# Patient Record
Sex: Female | Born: 1945
Health system: Southern US, Community
[De-identification: ages and names within clinical notes are randomized; demographics above are authoritative.]

## PROBLEM LIST (undated history)

## (undated) DIAGNOSIS — D126 Benign neoplasm of colon, unspecified: Secondary | ICD-10-CM

## (undated) DIAGNOSIS — H6122 Impacted cerumen, left ear: Secondary | ICD-10-CM

## (undated) DIAGNOSIS — Z9889 Other specified postprocedural states: Secondary | ICD-10-CM

## (undated) DIAGNOSIS — K921 Melena: Secondary | ICD-10-CM

## (undated) DIAGNOSIS — M545 Low back pain, unspecified: Secondary | ICD-10-CM

## (undated) DIAGNOSIS — L209 Atopic dermatitis, unspecified: Secondary | ICD-10-CM

## (undated) DIAGNOSIS — E039 Hypothyroidism, unspecified: Secondary | ICD-10-CM

## (undated) DIAGNOSIS — E119 Type 2 diabetes mellitus without complications: Secondary | ICD-10-CM

## (undated) DIAGNOSIS — Z86010 Personal history of colonic polyps: Secondary | ICD-10-CM

## (undated) DIAGNOSIS — R112 Nausea with vomiting, unspecified: Secondary | ICD-10-CM

## (undated) DIAGNOSIS — R519 Headache, unspecified: Secondary | ICD-10-CM

## (undated) DIAGNOSIS — K5792 Diverticulitis of intestine, part unspecified, without perforation or abscess without bleeding: Secondary | ICD-10-CM

## (undated) DIAGNOSIS — M1990 Unspecified osteoarthritis, unspecified site: Secondary | ICD-10-CM

## (undated) DIAGNOSIS — E785 Hyperlipidemia, unspecified: Secondary | ICD-10-CM

## (undated) DIAGNOSIS — M4316 Spondylolisthesis, lumbar region: Secondary | ICD-10-CM

## (undated) DIAGNOSIS — I2510 Atherosclerotic heart disease of native coronary artery without angina pectoris: Secondary | ICD-10-CM

## (undated) DIAGNOSIS — H6500 Acute serous otitis media, unspecified ear: Secondary | ICD-10-CM

## (undated) DIAGNOSIS — R55 Syncope and collapse: Secondary | ICD-10-CM

## (undated) DIAGNOSIS — H269 Unspecified cataract: Secondary | ICD-10-CM

## (undated) DIAGNOSIS — R9439 Abnormal result of other cardiovascular function study: Secondary | ICD-10-CM

## (undated) DIAGNOSIS — R195 Other fecal abnormalities: Secondary | ICD-10-CM

## (undated) DIAGNOSIS — I10 Essential (primary) hypertension: Secondary | ICD-10-CM

## (undated) HISTORY — DX: Headache, unspecified: R51.9

## (undated) HISTORY — DX: Hypothyroidism, unspecified: E03.9

## (undated) HISTORY — PX: OTHER SURGICAL HISTORY: SHX169

## (undated) HISTORY — PX: SALPINGOOPHORECTOMY: SHX82

## (undated) HISTORY — DX: Impacted cerumen, left ear: H61.22

## (undated) HISTORY — DX: Atopic dermatitis, unspecified: L20.9

## (undated) HISTORY — DX: Type 2 diabetes mellitus without complications: E11.9

## (undated) HISTORY — DX: Benign neoplasm of colon, unspecified: D12.6

## (undated) HISTORY — DX: Melena: K92.1

## (undated) HISTORY — DX: Essential (primary) hypertension: I10

## (undated) HISTORY — DX: Atherosclerotic heart disease of native coronary artery without angina pectoris: I25.10

## (undated) HISTORY — DX: Acute serous otitis media, unspecified ear: H65.00

## (undated) HISTORY — DX: Abnormal result of other cardiovascular function study: R94.39

## (undated) HISTORY — PX: DILATION AND CURETTAGE OF UTERUS: SHX78

## (undated) HISTORY — DX: Syncope and collapse: R55

## (undated) HISTORY — PX: TUBAL LIGATION: SHX77

## (undated) HISTORY — DX: Low back pain, unspecified: M54.50

## (undated) HISTORY — DX: Hyperlipidemia, unspecified: E78.5

## (undated) HISTORY — PX: COLONOSCOPY: SHX174

---
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
## 2011-11-24 ENCOUNTER — Ambulatory Visit (INDEPENDENT_AMBULATORY_CARE_PROVIDER_SITE_OTHER): Payer: Managed Care, Other (non HMO) | Admitting: Gastroenterology

## 2011-11-24 ENCOUNTER — Encounter: Payer: Self-pay | Admitting: Gastroenterology

## 2011-11-24 VITALS — BP 138/77 | HR 80 | Temp 99.1°F | Ht 63.0 in | Wt 168.8 lb

## 2011-11-24 DIAGNOSIS — K5732 Diverticulitis of large intestine without perforation or abscess without bleeding: Secondary | ICD-10-CM

## 2011-11-24 DIAGNOSIS — K5792 Diverticulitis of intestine, part unspecified, without perforation or abscess without bleeding: Secondary | ICD-10-CM | POA: Insufficient documentation

## 2011-11-24 HISTORY — DX: Diverticulitis of intestine, part unspecified, without perforation or abscess without bleeding: K57.92

## 2011-11-24 NOTE — Progress Notes (Signed)
Cc to PCP 

## 2011-11-24 NOTE — Progress Notes (Signed)
  Subjective:    Patient ID: Kathleen Terrell, female    DOB: 03/15/1946, 65 y.o.   MRN: [id number]  Pcp: [facility], ERIN JONES PAC  HPI 3 mos ago: Severe pain across mid abdomen, nausea, urge to go to BM. Finished CIP/FLAG AUG 2012 and got better. 2-3 weeks later: mild Sx, CIP BID FOR 10 DAYS.  Now feels fine. Had all teeth pulled and so couldn't eat anything. Bms: once a day. Since teeth pulled and eating less every other day.  HAD TCS 5 YEARS AGO-DR. ANWAR AT [facility]. No rectal bleeding, melena, or dysphagia. No problems with the sedation or the prep.   Past Medical History  Diagnosis Date  . HTN (hypertension)   . DM (diabetes mellitus)   . Hypothyroidism   . Hyperlipidemia     Past Surgical History  Procedure Date  . Cyst removed     under the left arm  . Salpingoophorectomy     left  . Colonoscopy [facility] ANWAR    >5 YEARS AGO    No Known Allergies  Current Outpatient Prescriptions  Medication Sig Dispense Refill  . aspirin 81 MG tablet Take 81 mg by mouth daily.        . levothyroxine (SYNTHROID, LEVOTHROID) 88 MCG tablet Take 88 mcg by mouth daily.        . lisinopril-hydrochlorothiazide (PRINZIDE,ZESTORETIC) 20-12.5 MG per tablet Take 1 tablet by mouth daily.        . pravastatin (PRAVACHOL) 40 MG tablet Take 40 mg by mouth daily.          Family History  Problem Relation Age of Onset  . Colon cancer Neg Hx   . 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  History  Substance Use Topics  . Smoking status: Former Smoker -- 1.5 packs/day    Types: Cigarettes  . Smokeless tobacco: Former User    Quit date: 03/23/1984  . Alcohol Use: No   Review of Systems  All other systems reviewed and are negative.      Objective:   Physical Exam  Vitals reviewed. Constitutional: She is oriented to person, place, and time. She appears well-developed and well-nourished. No  distress.  HENT:  Head: Normocephalic and atraumatic.  Mouth/Throat: Oropharynx is clear and moist. No oropharyngeal exudate.  Eyes: Pupils are equal, round, and reactive to light. No scleral icterus.  Neck: Normal range of motion. Neck supple.  Cardiovascular: Normal rate, regular rhythm and normal heart sounds.   Pulmonary/Chest: Effort normal and breath sounds normal.  Abdominal: Bowel sounds are normal. She exhibits no distension. There is no tenderness.  Musculoskeletal: Normal range of motion. She exhibits no edema.  Lymphadenopathy:    She has no cervical adenopathy.  Neurological: She is alert and oriented to person, place, and time.  Psychiatric: She has a normal mood and affect.          Assessment & Plan:   

## 2011-11-24 NOTE — Assessment & Plan Note (Signed)
LAST TCS > 5 YEARS.  TCS 12/17 OPV PRN

## 2011-11-28 NOTE — Progress Notes (Signed)
Reminder in epic to have tcs in 2017

## 2011-12-05 ENCOUNTER — Encounter (HOSPITAL_COMMUNITY): Payer: Self-pay | Admitting: Pharmacy Technician

## 2011-12-09 MED ORDER — SODIUM CHLORIDE 0.45 % IV SOLN
Freq: Once | INTRAVENOUS | Status: AC
  Administered 2011-12-12: 08:00:00 via INTRAVENOUS

## 2011-12-12 ENCOUNTER — Encounter (HOSPITAL_COMMUNITY): Payer: Self-pay

## 2011-12-12 ENCOUNTER — Other Ambulatory Visit: Payer: Self-pay | Admitting: Gastroenterology

## 2011-12-12 ENCOUNTER — Ambulatory Visit (HOSPITAL_COMMUNITY)
Admission: RE | Admit: 2011-12-12 | Discharge: 2011-12-12 | Disposition: A | Discharge status: Home / Self Care | Payer: Managed Care, Other (non HMO) | Source: Ambulatory Visit | Attending: Gastroenterology | Admitting: Gastroenterology

## 2011-12-12 ENCOUNTER — Encounter (HOSPITAL_COMMUNITY)
Admission: RE | Disposition: A | Discharge status: Home / Self Care | Payer: Self-pay | Source: Ambulatory Visit | Attending: Gastroenterology

## 2011-12-12 DIAGNOSIS — K648 Other hemorrhoids: Secondary | ICD-10-CM

## 2011-12-12 DIAGNOSIS — Z79899 Other long term (current) drug therapy: Secondary | ICD-10-CM | POA: Insufficient documentation

## 2011-12-12 DIAGNOSIS — E785 Hyperlipidemia, unspecified: Secondary | ICD-10-CM | POA: Insufficient documentation

## 2011-12-12 DIAGNOSIS — K5792 Diverticulitis of intestine, part unspecified, without perforation or abscess without bleeding: Secondary | ICD-10-CM

## 2011-12-12 DIAGNOSIS — D126 Benign neoplasm of colon, unspecified: Secondary | ICD-10-CM | POA: Insufficient documentation

## 2011-12-12 DIAGNOSIS — Z01812 Encounter for preprocedural laboratory examination: Secondary | ICD-10-CM | POA: Insufficient documentation

## 2011-12-12 DIAGNOSIS — K5730 Diverticulosis of large intestine without perforation or abscess without bleeding: Secondary | ICD-10-CM | POA: Insufficient documentation

## 2011-12-12 DIAGNOSIS — E119 Type 2 diabetes mellitus without complications: Secondary | ICD-10-CM | POA: Insufficient documentation

## 2011-12-12 DIAGNOSIS — Z7982 Long term (current) use of aspirin: Secondary | ICD-10-CM | POA: Insufficient documentation

## 2011-12-12 DIAGNOSIS — I10 Essential (primary) hypertension: Secondary | ICD-10-CM | POA: Insufficient documentation

## 2011-12-12 HISTORY — PX: COLONOSCOPY: SHX5424

## 2011-12-12 SURGERY — COLONOSCOPY
Anesthesia: Moderate Sedation

## 2011-12-12 MED ORDER — STERILE WATER FOR IRRIGATION IR SOLN
Status: DC | PRN
  Administered 2011-12-12: 09:00:00

## 2011-12-12 MED ORDER — MEPERIDINE HCL 100 MG/ML IJ SOLN
INTRAMUSCULAR | Status: DC | PRN
  Administered 2011-12-12 (×2): 25 mg via INTRAVENOUS

## 2011-12-12 MED ORDER — MIDAZOLAM HCL 5 MG/5ML IJ SOLN
INTRAMUSCULAR | Status: DC | PRN
  Administered 2011-12-12 (×2): 2 mg via INTRAVENOUS

## 2011-12-12 MED ORDER — MEPERIDINE HCL 100 MG/ML IJ SOLN
INTRAMUSCULAR | Status: AC
  Filled 2011-12-12: qty 1

## 2011-12-12 MED ORDER — MIDAZOLAM HCL 5 MG/5ML IJ SOLN
INTRAMUSCULAR | Status: AC
  Filled 2011-12-12: qty 5

## 2011-12-12 NOTE — H&P (View-Only) (Signed)
  Subjective:    Patient ID: Kathleen Terrell, female    DOB: [birth year], 65 y.o.   MRN: [id number]  Pcp: [facility], ERIN JONES PAC  HPI 3 mos ago: Severe pain across mid abdomen, nausea, urge to go to BM. Finished CIP/FLAG AUG 2012 and got better. 2-3 weeks later: mild Sx, CIP BID FOR 10 DAYS.  Now feels fine. Had all teeth pulled and so couldn't eat anything. Bms: once a day. Since teeth pulled and eating less every other day.  HAD TCS 5 YEARS AGO-DR. Linna Darner AT [facility] No rectal bleeding, melena, or dysphagia. No problems with the sedation or the prep.   Past Medical History  Diagnosis Date  . HTN (hypertension)   . DM (diabetes mellitus)   . Hypothyroidism   . Hyperlipidemia     Past Surgical History  Procedure Date  . Cyst removed     under the left arm  . Salpingoophorectomy     left  . Colonoscopy [facility]    >5 YEARS AGO    No Known Allergies  Current Outpatient Prescriptions  Medication Sig Dispense Refill  . aspirin 81 MG tablet Take 81 mg by mouth daily.        Marland Kitchen levothyroxine (SYNTHROID, LEVOTHROID) 88 MCG tablet Take 88 mcg by mouth daily.        Marland Kitchen lisinopril-hydrochlorothiazide (PRINZIDE,ZESTORETIC) 20-12.5 MG per tablet Take 1 tablet by mouth daily.        . pravastatin (PRAVACHOL) 40 MG tablet Take 40 mg by mouth daily.          Family History  Problem Relation Age of Onset  . Colon cancer Neg Hx   . 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  History  Substance Use Topics  . Smoking status: Former Smoker -- 1.5 packs/day    Types: Cigarettes  . Smokeless tobacco: Former Neurosurgeon    Quit date: 03/23/1984  . Alcohol Use: No   Review of Systems  All other systems reviewed and are negative.      Objective:   Physical Exam  Vitals reviewed. Constitutional: She is oriented to person, place, and time. She appears well-developed and well-nourished. No  distress.  HENT:  Head: Normocephalic and atraumatic.  Mouth/Throat: Oropharynx is clear and moist. No oropharyngeal exudate.  Eyes: Pupils are equal, round, and reactive to light. No scleral icterus.  Neck: Normal range of motion. Neck supple.  Cardiovascular: Normal rate, regular rhythm and normal heart sounds.   Pulmonary/Chest: Effort normal and breath sounds normal.  Abdominal: Bowel sounds are normal. She exhibits no distension. There is no tenderness.  Musculoskeletal: Normal range of motion. She exhibits no edema.  Lymphadenopathy:    She has no cervical adenopathy.  Neurological: She is alert and oriented to person, place, and time.  Psychiatric: She has a normal mood and affect.          Assessment & Plan:

## 2011-12-12 NOTE — Op Note (Signed)
[facility] [address] [place], [place]  [address]  COLONOSCOPY PROCEDURE REPORT  PATIENT:  Kathleen, Terrell  MR#:  [id number] BIRTHDATE:  [birth year], 65 yrs. old  GENDER:  female  ENDOSCOPIST:  Jonette Eva, MD REF. BY:  Jobe Marker, [facility]ICINE ASSISTANT:  PROCEDURE DATE:  12/12/2011 PROCEDURE:  Colonoscopy with biopsy  INDICATIONS:  [id number], LAST TCS > 5 YEARS  MEDICATIONS:   Demerol 50 mg IV, Versed 4 mg IV  DESCRIPTION OF PROCEDURE:    Physical exam was performed. Informed consent was obtained from the patient after explaining the benefits, risks, and alternatives to procedure.  The patient was connected to monitor and placed in left lateral position. Continuous oxygen was provided by nasal cannula and IV medicine administered through an indwelling cannula.  After administration of sedation and rectal exam, the patient's rectum was intubated and the EC-[id number] ([id number]) colonoscope was advanced under direct visualization to the cecum.  The scope was removed slowly by carefully examining the color, texture, anatomy, and integrity mucosa on the way out.  The patient was recovered in endoscopy and discharged home in satisfactory condition. <<PROCEDUREIMAGES>>  FINDINGS:  Diverticula were found FROM THE TRANSVERSE TO THE SIGMOID COLON.  There were TWO polyps identified and removed FROM THE DESCENDING (3 MM) VIA COLD FORCEPS & SIGMOID COLON(6 MM) VIA SNARE CAUTERY. SMALL INTERNAL HEMORRHOIDS.  PREP QUALITY: EXCELLENT CECAL W/D TIME:     15 minutes  COMPLICATIONS:    None  ENDOSCOPIC IMPRESSION: 1) Diverticulosis, MODERATE 2) Polyps, multiple in the DESCENDING & SIGMOID COLON 3) Internal hemorrhoids  RECOMMENDATIONS: TCS IN 5-10 YEARS HIGH IFBER DIET AWAIT BIOPSIES  REPEAT EXAM:  No  ______________________________ Jonette Eva, MD  CC:  n. eSIGNED:   Andoni Busch at 12/12/2011 10:09 AM  Benedict Needy, [phone]

## 2011-12-12 NOTE — Interval H&P Note (Signed)
History and Physical Interval Note:  12/12/2011 8:41 AM  Kathleen Terrell  has presented today for surgery, with the diagnosis of diverticulitis  The various methods of treatment have been discussed with the patient and family. After consideration of risks, benefits and other options for treatment, the patient has consented to  Procedure(s): COLONOSCOPY as a surgical intervention .  The patients' history has been reviewed, patient examined, no change in status, stable for surgery.  I have reviewed the patients' chart and labs.  Questions were answered to the patient's satisfaction.     [employer]

## 2011-12-15 ENCOUNTER — Telehealth: Payer: Self-pay | Admitting: Gastroenterology

## 2011-12-15 NOTE — Telephone Encounter (Signed)
Please call pt. She had A simple adenoma removed from her colon. TCS in 10 years. High fiber diet.  

## 2011-12-15 NOTE — Telephone Encounter (Signed)
Cc to PCP 

## 2011-12-16 NOTE — Telephone Encounter (Signed)
Pt informed

## 2011-12-26 ENCOUNTER — Encounter (HOSPITAL_COMMUNITY): Payer: Self-pay | Admitting: Gastroenterology

## 2011-12-28 ENCOUNTER — Telehealth: Payer: Self-pay | Admitting: Gastroenterology

## 2011-12-28 NOTE — Telephone Encounter (Signed)
Reminder in epic to have tcs in 10 years 

## 2015-05-11 DIAGNOSIS — I10 Essential (primary) hypertension: Secondary | ICD-10-CM | POA: Diagnosis not present

## 2015-05-11 DIAGNOSIS — E785 Hyperlipidemia, unspecified: Secondary | ICD-10-CM | POA: Diagnosis not present

## 2015-05-11 DIAGNOSIS — E119 Type 2 diabetes mellitus without complications: Secondary | ICD-10-CM | POA: Diagnosis not present

## 2015-05-11 DIAGNOSIS — E782 Mixed hyperlipidemia: Secondary | ICD-10-CM | POA: Diagnosis not present

## 2015-05-11 DIAGNOSIS — E039 Hypothyroidism, unspecified: Secondary | ICD-10-CM | POA: Diagnosis not present

## 2015-05-18 DIAGNOSIS — E119 Type 2 diabetes mellitus without complications: Secondary | ICD-10-CM | POA: Diagnosis not present

## 2015-05-18 DIAGNOSIS — L2089 Other atopic dermatitis: Secondary | ICD-10-CM | POA: Diagnosis not present

## 2015-05-18 DIAGNOSIS — I10 Essential (primary) hypertension: Secondary | ICD-10-CM | POA: Diagnosis not present

## 2015-05-18 DIAGNOSIS — E782 Mixed hyperlipidemia: Secondary | ICD-10-CM | POA: Diagnosis not present

## 2015-05-18 DIAGNOSIS — M545 Low back pain: Secondary | ICD-10-CM | POA: Diagnosis not present

## 2015-05-18 DIAGNOSIS — E039 Hypothyroidism, unspecified: Secondary | ICD-10-CM | POA: Diagnosis not present

## 2015-06-22 DIAGNOSIS — M5136 Other intervertebral disc degeneration, lumbar region: Secondary | ICD-10-CM | POA: Diagnosis not present

## 2015-06-22 DIAGNOSIS — M4806 Spinal stenosis, lumbar region: Secondary | ICD-10-CM | POA: Diagnosis not present

## 2015-06-22 DIAGNOSIS — M47816 Spondylosis without myelopathy or radiculopathy, lumbar region: Secondary | ICD-10-CM | POA: Diagnosis not present

## 2015-07-06 DIAGNOSIS — M549 Dorsalgia, unspecified: Secondary | ICD-10-CM | POA: Diagnosis not present

## 2015-07-06 DIAGNOSIS — Z6831 Body mass index (BMI) 31.0-31.9, adult: Secondary | ICD-10-CM | POA: Diagnosis not present

## 2015-07-06 DIAGNOSIS — M4316 Spondylolisthesis, lumbar region: Secondary | ICD-10-CM | POA: Diagnosis not present

## 2015-09-22 DIAGNOSIS — M5416 Radiculopathy, lumbar region: Secondary | ICD-10-CM | POA: Diagnosis not present

## 2015-09-22 DIAGNOSIS — R030 Elevated blood-pressure reading, without diagnosis of hypertension: Secondary | ICD-10-CM | POA: Diagnosis not present

## 2015-09-22 DIAGNOSIS — M4806 Spinal stenosis, lumbar region: Secondary | ICD-10-CM | POA: Diagnosis not present

## 2015-09-22 DIAGNOSIS — I10 Essential (primary) hypertension: Secondary | ICD-10-CM | POA: Diagnosis not present

## 2015-09-22 DIAGNOSIS — Z6831 Body mass index (BMI) 31.0-31.9, adult: Secondary | ICD-10-CM | POA: Diagnosis not present

## 2015-11-09 DIAGNOSIS — E119 Type 2 diabetes mellitus without complications: Secondary | ICD-10-CM | POA: Diagnosis not present

## 2015-11-09 DIAGNOSIS — E039 Hypothyroidism, unspecified: Secondary | ICD-10-CM | POA: Diagnosis not present

## 2015-11-09 DIAGNOSIS — E782 Mixed hyperlipidemia: Secondary | ICD-10-CM | POA: Diagnosis not present

## 2015-11-09 DIAGNOSIS — I10 Essential (primary) hypertension: Secondary | ICD-10-CM | POA: Diagnosis not present

## 2015-11-16 DIAGNOSIS — E782 Mixed hyperlipidemia: Secondary | ICD-10-CM | POA: Diagnosis not present

## 2015-11-16 DIAGNOSIS — I10 Essential (primary) hypertension: Secondary | ICD-10-CM | POA: Diagnosis not present

## 2015-11-16 DIAGNOSIS — E039 Hypothyroidism, unspecified: Secondary | ICD-10-CM | POA: Diagnosis not present

## 2015-11-16 DIAGNOSIS — M545 Low back pain: Secondary | ICD-10-CM | POA: Diagnosis not present

## 2015-11-16 DIAGNOSIS — E119 Type 2 diabetes mellitus without complications: Secondary | ICD-10-CM | POA: Diagnosis not present

## 2015-11-27 DIAGNOSIS — Z23 Encounter for immunization: Secondary | ICD-10-CM | POA: Diagnosis not present

## 2015-12-01 DIAGNOSIS — Z1231 Encounter for screening mammogram for malignant neoplasm of breast: Secondary | ICD-10-CM | POA: Diagnosis not present

## 2015-12-09 DIAGNOSIS — M4806 Spinal stenosis, lumbar region: Secondary | ICD-10-CM | POA: Diagnosis not present

## 2015-12-09 DIAGNOSIS — I10 Essential (primary) hypertension: Secondary | ICD-10-CM | POA: Diagnosis not present

## 2015-12-09 DIAGNOSIS — M5416 Radiculopathy, lumbar region: Secondary | ICD-10-CM | POA: Diagnosis not present

## 2015-12-09 DIAGNOSIS — Z6831 Body mass index (BMI) 31.0-31.9, adult: Secondary | ICD-10-CM | POA: Diagnosis not present

## 2016-03-22 DIAGNOSIS — I10 Essential (primary) hypertension: Secondary | ICD-10-CM | POA: Diagnosis not present

## 2016-03-22 DIAGNOSIS — M4806 Spinal stenosis, lumbar region: Secondary | ICD-10-CM | POA: Diagnosis not present

## 2016-03-22 DIAGNOSIS — M5416 Radiculopathy, lumbar region: Secondary | ICD-10-CM | POA: Diagnosis not present

## 2016-04-04 DIAGNOSIS — I10 Essential (primary) hypertension: Secondary | ICD-10-CM | POA: Diagnosis not present

## 2016-04-04 DIAGNOSIS — M4316 Spondylolisthesis, lumbar region: Secondary | ICD-10-CM | POA: Diagnosis not present

## 2016-06-01 DIAGNOSIS — I10 Essential (primary) hypertension: Secondary | ICD-10-CM | POA: Diagnosis not present

## 2016-06-01 DIAGNOSIS — M5416 Radiculopathy, lumbar region: Secondary | ICD-10-CM | POA: Diagnosis not present

## 2016-06-01 DIAGNOSIS — M4806 Spinal stenosis, lumbar region: Secondary | ICD-10-CM | POA: Diagnosis not present

## 2016-06-30 DIAGNOSIS — E039 Hypothyroidism, unspecified: Secondary | ICD-10-CM | POA: Diagnosis not present

## 2016-06-30 DIAGNOSIS — E782 Mixed hyperlipidemia: Secondary | ICD-10-CM | POA: Diagnosis not present

## 2016-06-30 DIAGNOSIS — E785 Hyperlipidemia, unspecified: Secondary | ICD-10-CM | POA: Diagnosis not present

## 2016-06-30 DIAGNOSIS — I10 Essential (primary) hypertension: Secondary | ICD-10-CM | POA: Diagnosis not present

## 2016-06-30 DIAGNOSIS — E119 Type 2 diabetes mellitus without complications: Secondary | ICD-10-CM | POA: Diagnosis not present

## 2016-07-08 DIAGNOSIS — Z0000 Encounter for general adult medical examination without abnormal findings: Secondary | ICD-10-CM | POA: Diagnosis not present

## 2016-07-08 DIAGNOSIS — M545 Low back pain: Secondary | ICD-10-CM | POA: Diagnosis not present

## 2016-07-26 DIAGNOSIS — I10 Essential (primary) hypertension: Secondary | ICD-10-CM | POA: Diagnosis not present

## 2016-07-26 DIAGNOSIS — M5416 Radiculopathy, lumbar region: Secondary | ICD-10-CM | POA: Diagnosis not present

## 2016-07-29 ENCOUNTER — Other Ambulatory Visit: Payer: Self-pay | Admitting: Neurosurgery

## 2016-08-03 DIAGNOSIS — H2513 Age-related nuclear cataract, bilateral: Secondary | ICD-10-CM | POA: Diagnosis not present

## 2016-08-03 DIAGNOSIS — H538 Other visual disturbances: Secondary | ICD-10-CM | POA: Diagnosis not present

## 2016-08-15 DIAGNOSIS — H538 Other visual disturbances: Secondary | ICD-10-CM | POA: Diagnosis not present

## 2016-08-25 DIAGNOSIS — I10 Essential (primary) hypertension: Secondary | ICD-10-CM | POA: Diagnosis not present

## 2016-08-25 DIAGNOSIS — M5416 Radiculopathy, lumbar region: Secondary | ICD-10-CM | POA: Diagnosis not present

## 2016-08-25 DIAGNOSIS — M4316 Spondylolisthesis, lumbar region: Secondary | ICD-10-CM | POA: Diagnosis not present

## 2016-08-25 DIAGNOSIS — M4806 Spinal stenosis, lumbar region: Secondary | ICD-10-CM | POA: Diagnosis not present

## 2016-08-30 ENCOUNTER — Other Ambulatory Visit: Payer: Self-pay

## 2016-08-30 ENCOUNTER — Encounter (HOSPITAL_COMMUNITY): Payer: Self-pay | Admitting: *Deleted

## 2016-08-30 ENCOUNTER — Encounter (HOSPITAL_COMMUNITY)
Admission: RE | Admit: 2016-08-30 | Discharge: 2016-08-30 | Disposition: A | Discharge status: Home / Self Care | Payer: Medicare Other | Source: Ambulatory Visit | Attending: Neurosurgery | Admitting: Neurosurgery

## 2016-08-30 DIAGNOSIS — Z01818 Encounter for other preprocedural examination: Secondary | ICD-10-CM | POA: Diagnosis not present

## 2016-08-30 DIAGNOSIS — M5416 Radiculopathy, lumbar region: Secondary | ICD-10-CM | POA: Insufficient documentation

## 2016-08-30 DIAGNOSIS — Z01812 Encounter for preprocedural laboratory examination: Secondary | ICD-10-CM | POA: Insufficient documentation

## 2016-08-30 DIAGNOSIS — I10 Essential (primary) hypertension: Secondary | ICD-10-CM | POA: Insufficient documentation

## 2016-08-30 DIAGNOSIS — Z0183 Encounter for blood typing: Secondary | ICD-10-CM | POA: Diagnosis not present

## 2016-08-30 HISTORY — DX: Unspecified osteoarthritis, unspecified site: M19.90

## 2016-08-30 HISTORY — DX: Nausea with vomiting, unspecified: Z98.890

## 2016-08-30 HISTORY — DX: Unspecified cataract: H26.9

## 2016-08-30 HISTORY — DX: Other specified postprocedural states: R11.2

## 2016-08-30 LAB — SURGICAL PCR SCREEN
MRSA, PCR: NEGATIVE
Staphylococcus aureus: NEGATIVE

## 2016-08-30 LAB — CBC
HCT: 39.9 % (ref 36.0–46.0)
Hemoglobin: 12.6 g/dL (ref 12.0–15.0)
MCH: 27.4 pg (ref 26.0–34.0)
MCHC: 31.6 g/dL (ref 30.0–36.0)
MCV: 86.7 fL (ref 78.0–100.0)
PLATELETS: 197 10*3/uL (ref 150–400)
RBC: 4.6 MIL/uL (ref 3.87–5.11)
RDW: 14.6 % (ref 11.5–15.5)
WBC: 6.8 10*3/uL (ref 4.0–10.5)

## 2016-08-30 LAB — BASIC METABOLIC PANEL
Anion gap: 8 (ref 5–15)
BUN: 7 mg/dL (ref 6–20)
CALCIUM: 10 mg/dL (ref 8.9–10.3)
CHLORIDE: 104 mmol/L (ref 101–111)
CO2: 27 mmol/L (ref 22–32)
CREATININE: 0.75 mg/dL (ref 0.44–1.00)
GFR calc Af Amer: >60 mL/min (ref >60)
GFR calc non Af Amer: >60 mL/min (ref >60)
GLUCOSE: 81 mg/dL (ref 65–99)
Potassium: 3.7 mmol/L (ref 3.5–5.1)
Sodium: 139 mmol/L (ref 135–145)

## 2016-08-30 LAB — TYPE AND SCREEN
ABO/RH(D): O POS
ANTIBODY SCREEN: NEGATIVE

## 2016-08-30 LAB — ABO/RH: ABO/RH(D): O POS

## 2016-08-30 NOTE — Pre-Procedure Instructions (Signed)
Kathleen Terrell  08/30/2016      [place], [place] [facility] [place] [address] [place] [address] Phone: [phone] Fax: [phone]    Your procedure is scheduled on September 12  Report to [place] at 0700 A.M.  Call this number if you have problems the morning of surgery:  [phone]   Remember:  Do not eat food or drink liquids after midnight.   Take these medicines the morning of surgery with A SIP OF WATER Acetaminophen (tylenol), levothyroxine (synthroid),   7 days prior to surgery STOP taking any Aspirin, Aleve, Naproxen, Ibuprofen, Motrin, Advil, Goody's, BC's, all herbal medications, fish oil, and all vitamins      Do not wear jewelry, make-up or nail polish.  Do not wear lotions, powders, or perfumes, or deoderant.  Do not shave 48 hours prior to surgery.    Do not bring valuables to the hospital.  [facility] is not responsible for any belongings or valuables.  Contacts, dentures or bridgework may not be worn into surgery.  Leave your suitcase in the car.  After surgery it may be brought to your room.  For patients admitted to the hospital, discharge time will be determined by your treatment team.  Patients discharged the day of surgery will not be allowed to drive home.    Special instructions:   [place]- Preparing For Surgery  Before surgery, you can play an important role. Because skin is not sterile, your skin needs to be as free of germs as possible. You can reduce the number of germs on your skin by washing with CHG (chlorahexidine gluconate) Soap before surgery.  CHG is an antiseptic cleaner which kills germs and bonds with the skin to continue killing germs even after washing.  Please do not use if you have an allergy to CHG or antibacterial soaps. If your skin becomes reddened/irritated stop using the CHG.  Do not shave (including legs and underarms) for at least 48  hours prior to first CHG shower. It is OK to shave your face.  Please follow these instructions carefully.   1. Shower the NIGHT BEFORE SURGERY and the MORNING OF SURGERY with CHG.   2. If you chose to wash your hair, wash your hair first as usual with your normal shampoo.  3. After you shampoo, rinse your hair and body thoroughly to remove the shampoo.  4. Use CHG as you would any other liquid soap. You can apply CHG directly to the skin and wash gently with a scrungie or a clean washcloth.   5. Apply the CHG Soap to your body ONLY FROM THE NECK DOWN.  Do not use on open wounds or open sores. Avoid contact with your eyes, ears, mouth and genitals (private parts). Wash genitals (private parts) with your normal soap.  6. Wash thoroughly, paying special attention to the area where your surgery will be performed.  7. Thoroughly rinse your body with warm water from the neck down.  8. DO NOT shower/wash with your normal soap after using and rinsing off the CHG Soap.  9. Pat yourself dry with a CLEAN TOWEL.   10. Wear CLEAN PAJAMAS   11. Place CLEAN SHEETS on your bed the night of your first shower and DO NOT SLEEP WITH PETS.    Day of Surgery: Do not apply any deodorants/lotions. Please wear clean clothes to the hospital/surgery center.  Please read over the following fact sheets that you were given. Pain Booklet, Coughing and Deep Breathing, MRSA Information and Surgical Site Infection Prevention

## 2016-08-30 NOTE — Progress Notes (Signed)
PCP - Rory Percy Cardiologist - denies  Chest x-ray - not needed EKG - 08/30/16 Stress Test denies-  ECHO - denies Cardiac Cath - denies    Patient denies shortness of breath, fever, cough and chest pain at PAT appointment

## 2016-09-05 HISTORY — PX: BACK SURGERY: SHX140

## 2016-09-05 MED ORDER — CEFAZOLIN SODIUM-DEXTROSE 2-4 GM/100ML-% IV SOLN
2.0000 g | INTRAVENOUS | Status: AC
  Administered 2016-09-06: 2 g via INTRAVENOUS
  Filled 2016-09-05: qty 100

## 2016-09-05 NOTE — Anesthesia Preprocedure Evaluation (Addendum)
Anesthesia Evaluation  Patient identified by MRN, date of birth, ID band Patient awake    Reviewed: Allergy & Precautions, NPO status , Patient's Chart, lab work & pertinent test results  History of Anesthesia Complications (+) PONV and history of anesthetic complications  Airway Mallampati: II  TM Distance: >3 FB Neck ROM: Full    Dental  (+) Edentulous Upper, Edentulous Lower   Pulmonary neg pulmonary ROS, former smoker,    Pulmonary exam normal breath sounds clear to auscultation       Cardiovascular hypertension, negative cardio ROS Normal cardiovascular exam Rhythm:Regular Rate:Normal     Neuro/Psych negative neurological ROS  negative psych ROS   GI/Hepatic negative GI ROS, Neg liver ROS,   Endo/Other  diabetesHypothyroidism   Renal/GU negative Renal ROS     Musculoskeletal  (+) Arthritis ,   Abdominal (+) + obese, scaphoid   Peds  Hematology negative hematology ROS (+)   Anesthesia Other Findings   Reproductive/Obstetrics negative OB ROS                            Anesthesia Physical Anesthesia Plan  ASA: II  Anesthesia Plan: General   Post-op Pain Management:    Induction: Intravenous  Airway Management Planned: Oral ETT  Additional Equipment:   Intra-op Plan:   Post-operative Plan: Extubation in OR  Informed Consent: I have reviewed the patients History and Physical, chart, labs and discussed the procedure including the risks, benefits and alternatives for the proposed anesthesia with the patient or authorized representative who has indicated his/her understanding and acceptance.   Dental advisory given  Plan Discussed with: CRNA  Anesthesia Plan Comments:         Anesthesia Quick Evaluation

## 2016-09-05 NOTE — H&P (Signed)
Kathleen Terrell is an 70 y.o. female.   Chief Complaint: chronic back pain HPI: patient who saw me initially last year with complains of lumbar pain with radiation to both legs associated with weakness. Mri twice showed l4-5 spondylolisthesis with stenosis  Past Medical History:  Diagnosis Date  . Arthritis   . Cataracts, bilateral   . DM (diabetes mellitus) ([place])    patient denies, not on medications, diet controlled  . HTN (hypertension)   . Hyperlipidemia   . Hypothyroidism   . PONV (postoperative nausea and vomiting)     Past Surgical History:  Procedure Laterality Date  . COLONOSCOPY  [place] [facility]   >5 YEARS AGO  . COLONOSCOPY  12/12/2011   Procedure: COLONOSCOPY;  Surgeon: Dorothyann Peng, MD;  Location: [facility];  Service: Endoscopy;  Laterality: N/A;  9:00  . cyst removed     under the left arm  . DILATION AND CURETTAGE OF UTERUS    . SALPINGOOPHORECTOMY     left  . TUBAL LIGATION    . tubual pregnancy      Family History  Problem Relation Age of Onset  . Colon cancer Neg Hx   . Colon polyps Neg Hx    Social History:  reports that she quit smoking about 32 years ago. Her smoking use included Cigarettes. She smoked 1.50 packs per day. She has never used smokeless tobacco. She reports that she does not drink alcohol or use drugs.  Allergies:  Allergies  Allergen Reactions  . Darvocet [Propoxyphene N-Acetaminophen] Other (See Comments)    UNSPECIFIED REACTION   . Latex Rash    No prescriptions prior to admission.    No results found for this or any previous visit (from the past 48 hour(s)). No results found.  Review of Systems  Constitutional: Negative.   HENT: Negative.   Eyes: Negative.   Respiratory: Negative.   Gastrointestinal: Negative.   Genitourinary: Negative.   Musculoskeletal: Positive for back pain.  Skin: Negative.   Neurological: Positive for sensory change and focal weakness.  Endo/Heme/Allergies: Negative.   Psychiatric/Behavioral:  Negative.     There were no vitals taken for this visit. Physical Exam hent, nl. Neck, nl. Cv, nl. Lungs, clear. Abdomen, nl. Extremities nl. NEURO weakness of dorsiflexion of both feet. SLR positive at 60 degrees. sensory and dtr are normal  Mri shows grade 1 spondylolisthesis and stenosis at l4-5 Assessment/Plan Patient wants to go ahead with surgery. She and daughter are aware of risks and benefits  Floyce Stakes, MD 09/05/2016, 5:27 PM

## 2016-09-06 ENCOUNTER — Inpatient Hospital Stay (HOSPITAL_COMMUNITY)
Admission: RE | Admit: 2016-09-06 | Discharge: 2016-09-10 | DRG: 460 | Disposition: A | Discharge status: Home Health | Payer: Medicare Other | Source: Ambulatory Visit | Attending: Neurosurgery | Admitting: Neurosurgery

## 2016-09-06 ENCOUNTER — Inpatient Hospital Stay (HOSPITAL_COMMUNITY): Payer: Medicare Other

## 2016-09-06 ENCOUNTER — Inpatient Hospital Stay (HOSPITAL_COMMUNITY): Payer: Medicare Other | Admitting: Certified Registered Nurse Anesthetist

## 2016-09-06 ENCOUNTER — Inpatient Hospital Stay (HOSPITAL_COMMUNITY)
Admission: RE | Disposition: A | Discharge status: Home Health | Payer: Self-pay | Source: Ambulatory Visit | Attending: Neurosurgery

## 2016-09-06 ENCOUNTER — Encounter (HOSPITAL_COMMUNITY): Payer: Self-pay

## 2016-09-06 DIAGNOSIS — Z886 Allergy status to analgesic agent status: Secondary | ICD-10-CM | POA: Diagnosis not present

## 2016-09-06 DIAGNOSIS — Z419 Encounter for procedure for purposes other than remedying health state, unspecified: Secondary | ICD-10-CM

## 2016-09-06 DIAGNOSIS — M4316 Spondylolisthesis, lumbar region: Secondary | ICD-10-CM | POA: Diagnosis not present

## 2016-09-06 DIAGNOSIS — I10 Essential (primary) hypertension: Secondary | ICD-10-CM | POA: Diagnosis not present

## 2016-09-06 DIAGNOSIS — E669 Obesity, unspecified: Secondary | ICD-10-CM | POA: Diagnosis present

## 2016-09-06 DIAGNOSIS — M4806 Spinal stenosis, lumbar region: Secondary | ICD-10-CM | POA: Diagnosis present

## 2016-09-06 DIAGNOSIS — E785 Hyperlipidemia, unspecified: Secondary | ICD-10-CM | POA: Diagnosis not present

## 2016-09-06 DIAGNOSIS — E119 Type 2 diabetes mellitus without complications: Secondary | ICD-10-CM | POA: Diagnosis not present

## 2016-09-06 DIAGNOSIS — Z6838 Body mass index (BMI) 38.0-38.9, adult: Secondary | ICD-10-CM | POA: Diagnosis not present

## 2016-09-06 DIAGNOSIS — M5489 Other dorsalgia: Secondary | ICD-10-CM | POA: Diagnosis present

## 2016-09-06 DIAGNOSIS — K5900 Constipation, unspecified: Secondary | ICD-10-CM | POA: Diagnosis not present

## 2016-09-06 DIAGNOSIS — E039 Hypothyroidism, unspecified: Secondary | ICD-10-CM | POA: Diagnosis not present

## 2016-09-06 DIAGNOSIS — M5416 Radiculopathy, lumbar region: Secondary | ICD-10-CM | POA: Diagnosis present

## 2016-09-06 DIAGNOSIS — M4326 Fusion of spine, lumbar region: Secondary | ICD-10-CM | POA: Diagnosis not present

## 2016-09-06 DIAGNOSIS — Z91040 Latex allergy status: Secondary | ICD-10-CM | POA: Diagnosis not present

## 2016-09-06 DIAGNOSIS — Z87891 Personal history of nicotine dependence: Secondary | ICD-10-CM | POA: Diagnosis not present

## 2016-09-06 DIAGNOSIS — R269 Unspecified abnormalities of gait and mobility: Secondary | ICD-10-CM | POA: Diagnosis not present

## 2016-09-06 DIAGNOSIS — Z981 Arthrodesis status: Secondary | ICD-10-CM | POA: Diagnosis not present

## 2016-09-06 HISTORY — DX: Spondylolisthesis, lumbar region: M43.16

## 2016-09-06 LAB — GLUCOSE, CAPILLARY
Glucose-Capillary: 102 mg/dL — ABNORMAL HIGH (ref 65–99)
Glucose-Capillary: 108 mg/dL — ABNORMAL HIGH (ref 65–99)

## 2016-09-06 SURGERY — POSTERIOR LUMBAR FUSION 1 LEVEL
Anesthesia: General

## 2016-09-06 MED ORDER — DIPHENHYDRAMINE HCL 50 MG/ML IJ SOLN: 12.5000 mg | Freq: Four times a day (QID) | INTRAMUSCULAR | Status: DC | PRN

## 2016-09-06 MED ORDER — FENTANYL CITRATE (PF) 100 MCG/2ML IJ SOLN
INTRAMUSCULAR | Status: AC
  Filled 2016-09-06: qty 2

## 2016-09-06 MED ORDER — LEVOTHYROXINE SODIUM 88 MCG PO TABS
88.0000 ug | ORAL_TABLET | Freq: Every day | ORAL | Status: DC
  Administered 2016-09-07 – 2016-09-10 (×4): 88 ug via ORAL
  Filled 2016-09-06 (×4): qty 1

## 2016-09-06 MED ORDER — MORPHINE SULFATE (PF) 2 MG/ML IV SOLN
1.0000 mg | INTRAVENOUS | Status: DC | PRN
  Administered 2016-09-06 (×4): 2 mg via INTRAVENOUS
  Filled 2016-09-06 (×4): qty 1

## 2016-09-06 MED ORDER — CHLORHEXIDINE GLUCONATE CLOTH 2 % EX PADS: 6.0000 | MEDICATED_PAD | Freq: Once | CUTANEOUS | Status: DC

## 2016-09-06 MED ORDER — NEOSTIGMINE METHYLSULFATE 10 MG/10ML IV SOLN
INTRAVENOUS | Status: DC | PRN
  Administered 2016-09-06: 3 mg via INTRAVENOUS

## 2016-09-06 MED ORDER — ALBUMIN HUMAN 5 % IV SOLN
INTRAVENOUS | Status: DC | PRN
  Administered 2016-09-06: 10:00:00 via INTRAVENOUS

## 2016-09-06 MED ORDER — FENTANYL CITRATE (PF) 100 MCG/2ML IJ SOLN
INTRAMUSCULAR | Status: DC | PRN
Start: 2016-09-06 — End: 2016-09-06
  Administered 2016-09-06 (×2): 50 ug via INTRAVENOUS
  Administered 2016-09-06: 100 ug via INTRAVENOUS
  Administered 2016-09-06 (×2): 50 ug via INTRAVENOUS

## 2016-09-06 MED ORDER — PROPOFOL 10 MG/ML IV BOLUS
INTRAVENOUS | Status: DC | PRN
  Administered 2016-09-06: 150 mg via INTRAVENOUS

## 2016-09-06 MED ORDER — 0.9 % SODIUM CHLORIDE (POUR BTL) OPTIME
TOPICAL | Status: DC | PRN
  Administered 2016-09-06: 1000 mL

## 2016-09-06 MED ORDER — HYDROMORPHONE HCL 1 MG/ML IJ SOLN
INTRAMUSCULAR | Status: AC
  Filled 2016-09-06: qty 1

## 2016-09-06 MED ORDER — MEPERIDINE HCL 25 MG/ML IJ SOLN: 6.2500 mg | INTRAMUSCULAR | Status: DC | PRN

## 2016-09-06 MED ORDER — LIDOCAINE HCL (CARDIAC) 20 MG/ML IV SOLN
INTRAVENOUS | Status: DC | PRN
  Administered 2016-09-06: 100 mg via INTRAVENOUS

## 2016-09-06 MED ORDER — ONDANSETRON HCL 4 MG/2ML IJ SOLN
4.0000 mg | INTRAMUSCULAR | Status: DC | PRN
  Administered 2016-09-06 – 2016-09-08 (×5): 4 mg via INTRAVENOUS
  Filled 2016-09-06 (×5): qty 2

## 2016-09-06 MED ORDER — SODIUM CHLORIDE 0.9% FLUSH: 9.0000 mL | INTRAVENOUS | Status: DC | PRN

## 2016-09-06 MED ORDER — SODIUM CHLORIDE 0.9% FLUSH: 3.0000 mL | INTRAVENOUS | Status: DC | PRN

## 2016-09-06 MED ORDER — PROMETHAZINE HCL 25 MG/ML IJ SOLN: 6.2500 mg | INTRAMUSCULAR | Status: DC | PRN

## 2016-09-06 MED ORDER — SODIUM CHLORIDE 0.9% FLUSH
3.0000 mL | Freq: Two times a day (BID) | INTRAVENOUS | Status: DC
  Administered 2016-09-06: 3 mL via INTRAVENOUS

## 2016-09-06 MED ORDER — GLYCOPYRROLATE 0.2 MG/ML IJ SOLN
INTRAMUSCULAR | Status: DC | PRN
  Administered 2016-09-06: 0.4 mg via INTRAVENOUS

## 2016-09-06 MED ORDER — PRAVASTATIN SODIUM 40 MG PO TABS
40.0000 mg | ORAL_TABLET | Freq: Every day | ORAL | Status: DC
  Administered 2016-09-06 – 2016-09-09 (×4): 40 mg via ORAL
  Filled 2016-09-06 (×5): qty 1

## 2016-09-06 MED ORDER — LISINOPRIL 20 MG PO TABS
20.0000 mg | ORAL_TABLET | Freq: Every day | ORAL | Status: DC
  Administered 2016-09-08 – 2016-09-10 (×3): 20 mg via ORAL
  Filled 2016-09-06 (×4): qty 1

## 2016-09-06 MED ORDER — PHENOL 1.4 % MT LIQD: 1.0000 | OROMUCOSAL | Status: DC | PRN

## 2016-09-06 MED ORDER — ONDANSETRON HCL 4 MG/2ML IJ SOLN: 4.0000 mg | Freq: Four times a day (QID) | INTRAMUSCULAR | Status: DC | PRN

## 2016-09-06 MED ORDER — HEMOSTATIC AGENTS (NO CHARGE) OPTIME
TOPICAL | Status: DC | PRN
  Administered 2016-09-06: 1 via TOPICAL

## 2016-09-06 MED ORDER — ACETAMINOPHEN 325 MG PO TABS
650.0000 mg | ORAL_TABLET | ORAL | Status: DC | PRN
  Administered 2016-09-08: 650 mg via ORAL
  Filled 2016-09-06: qty 2

## 2016-09-06 MED ORDER — HYDROMORPHONE HCL 1 MG/ML IJ SOLN
0.2500 mg | INTRAMUSCULAR | Status: DC | PRN
  Administered 2016-09-06: 0.5 mg via INTRAVENOUS

## 2016-09-06 MED ORDER — DEXTROSE 5 % IV SOLN
INTRAVENOUS | Status: DC | PRN
  Administered 2016-09-06: 15 ug/min via INTRAVENOUS

## 2016-09-06 MED ORDER — ROCURONIUM BROMIDE 100 MG/10ML IV SOLN
INTRAVENOUS | Status: DC | PRN
  Administered 2016-09-06: 10 mg via INTRAVENOUS
  Administered 2016-09-06: 50 mg via INTRAVENOUS

## 2016-09-06 MED ORDER — SODIUM CHLORIDE 0.9 % IV SOLN: 250.0000 mL | INTRAVENOUS | Status: DC

## 2016-09-06 MED ORDER — HYDROCHLOROTHIAZIDE 12.5 MG PO CAPS
12.5000 mg | ORAL_CAPSULE | Freq: Every day | ORAL | Status: DC
  Administered 2016-09-08 – 2016-09-10 (×3): 12.5 mg via ORAL
  Filled 2016-09-06 (×4): qty 1

## 2016-09-06 MED ORDER — DIPHENHYDRAMINE HCL 12.5 MG/5ML PO ELIX: 12.5000 mg | ORAL_SOLUTION | Freq: Four times a day (QID) | ORAL | Status: DC | PRN

## 2016-09-06 MED ORDER — SODIUM CHLORIDE 0.9 % IV SOLN
INTRAVENOUS | Status: DC
  Administered 2016-09-06: 15:00:00 via INTRAVENOUS
  Administered 2016-09-07: 1000 mL via INTRAVENOUS

## 2016-09-06 MED ORDER — MORPHINE SULFATE 2 MG/ML IV SOLN
INTRAVENOUS | Status: DC
  Administered 2016-09-06: 21:00:00 via INTRAVENOUS
  Administered 2016-09-07: 3 mg via INTRAVENOUS
  Administered 2016-09-07: 1.5 mg via INTRAVENOUS
  Filled 2016-09-06: qty 25

## 2016-09-06 MED ORDER — LACTATED RINGERS IV SOLN
INTRAVENOUS | Status: DC | PRN
  Administered 2016-09-06 (×2): via INTRAVENOUS

## 2016-09-06 MED ORDER — ONDANSETRON HCL 4 MG/2ML IJ SOLN
INTRAMUSCULAR | Status: DC | PRN
  Administered 2016-09-06: 4 mg via INTRAVENOUS

## 2016-09-06 MED ORDER — MENTHOL 3 MG MT LOZG: 1.0000 | LOZENGE | OROMUCOSAL | Status: DC | PRN

## 2016-09-06 MED ORDER — CYCLOBENZAPRINE HCL 10 MG PO TABS
10.0000 mg | ORAL_TABLET | Freq: Three times a day (TID) | ORAL | Status: DC | PRN
  Administered 2016-09-06 – 2016-09-08 (×5): 10 mg via ORAL
  Filled 2016-09-06 (×7): qty 1

## 2016-09-06 MED ORDER — CEFAZOLIN IN D5W 1 GM/50ML IV SOLN
1.0000 g | Freq: Three times a day (TID) | INTRAVENOUS | Status: AC
  Administered 2016-09-06 (×2): 1 g via INTRAVENOUS
  Filled 2016-09-06 (×2): qty 50

## 2016-09-06 MED ORDER — LISINOPRIL-HYDROCHLOROTHIAZIDE 20-12.5 MG PO TABS: 1.0000 | ORAL_TABLET | Freq: Every day | ORAL | Status: DC

## 2016-09-06 MED ORDER — ACETAMINOPHEN 650 MG RE SUPP: 650.0000 mg | RECTAL | Status: DC | PRN

## 2016-09-06 MED ORDER — THROMBIN 20000 UNITS EX KIT
PACK | CUTANEOUS | Status: DC | PRN
  Administered 2016-09-06: 20000 [IU] via TOPICAL

## 2016-09-06 MED ORDER — PROPOFOL 10 MG/ML IV BOLUS
INTRAVENOUS | Status: AC
  Filled 2016-09-06: qty 20

## 2016-09-06 MED ORDER — OXYCODONE-ACETAMINOPHEN 5-325 MG PO TABS
1.0000 | ORAL_TABLET | ORAL | Status: DC | PRN
  Administered 2016-09-06 (×2): 2 via ORAL
  Administered 2016-09-07 – 2016-09-08 (×4): 1 via ORAL
  Filled 2016-09-06 (×2): qty 2
  Filled 2016-09-06 (×4): qty 1

## 2016-09-06 MED ORDER — NALOXONE HCL 0.4 MG/ML IJ SOLN: 0.4000 mg | INTRAMUSCULAR | Status: DC | PRN

## 2016-09-06 MED FILL — Sodium Chloride IV Soln 0.9%: INTRAVENOUS | Qty: 1000 | Status: AC

## 2016-09-06 MED FILL — Heparin Sodium (Porcine) Inj 1000 Unit/ML: INTRAMUSCULAR | Qty: 30 | Status: AC

## 2016-09-06 SURGICAL SUPPLY — 69 items
BENZOIN TINCTURE PRP APPL 2/3 (GAUZE/BANDAGES/DRESSINGS) ×3 IMPLANT
BLADE CLIPPER SURG (BLADE) IMPLANT
BUR ACORN 6.0 (BURR) ×2 IMPLANT
BUR ACORN 6.0MM (BURR) ×1
BUR MATCHSTICK NEURO 3.0 LAGG (BURR) ×3 IMPLANT
CANISTER SUCT 3000ML PPV (MISCELLANEOUS) ×3 IMPLANT
CAP LOCKING THREADED (Cap) ×12 IMPLANT
CLOSURE WOUND 1/2 X4 (GAUZE/BANDAGES/DRESSINGS) ×1
CONT SPEC 4OZ CLIKSEAL STRL BL (MISCELLANEOUS) ×3 IMPLANT
COVER BACK TABLE 60X90IN (DRAPES) ×3 IMPLANT
DERMABOND ADVANCED (GAUZE/BANDAGES/DRESSINGS) ×2
DERMABOND ADVANCED .7 DNX12 (GAUZE/BANDAGES/DRESSINGS) ×1 IMPLANT
DRAPE C-ARM 42X72 X-RAY (DRAPES) ×6 IMPLANT
DRAPE LAPAROTOMY 100X72X124 (DRAPES) ×3 IMPLANT
DRAPE POUCH INSTRU U-SHP 10X18 (DRAPES) ×3 IMPLANT
DRSG PAD ABDOMINAL 8X10 ST (GAUZE/BANDAGES/DRESSINGS) IMPLANT
DURAPREP 26ML APPLICATOR (WOUND CARE) ×3 IMPLANT
ELECT BLADE 4.0 EZ CLEAN MEGAD (MISCELLANEOUS) ×3
ELECT REM PT RETURN 9FT ADLT (ELECTROSURGICAL) ×3
ELECTRODE BLDE 4.0 EZ CLN MEGD (MISCELLANEOUS) ×1 IMPLANT
ELECTRODE REM PT RTRN 9FT ADLT (ELECTROSURGICAL) ×1 IMPLANT
EVACUATOR 1/8 PVC DRAIN (DRAIN) IMPLANT
GAUZE SPONGE 4X4 12PLY STRL (GAUZE/BANDAGES/DRESSINGS) IMPLANT
GAUZE SPONGE 4X4 16PLY XRAY LF (GAUZE/BANDAGES/DRESSINGS) IMPLANT
GLOVE BIOGEL M 8.0 STRL (GLOVE) IMPLANT
GLOVE BIOGEL PI IND STRL 7.0 (GLOVE) ×3 IMPLANT
GLOVE BIOGEL PI IND STRL 7.5 (GLOVE) ×3 IMPLANT
GLOVE BIOGEL PI INDICATOR 7.0 (GLOVE) ×6
GLOVE BIOGEL PI INDICATOR 7.5 (GLOVE) ×6
GLOVE EXAM NITRILE LRG STRL (GLOVE) IMPLANT
GLOVE EXAM NITRILE XL STR (GLOVE) IMPLANT
GLOVE EXAM NITRILE XS STR PU (GLOVE) IMPLANT
GLOVE SURG SS PI 8.0 STRL IVOR (GLOVE) ×12 IMPLANT
GOWN STRL REUS W/ TWL LRG LVL3 (GOWN DISPOSABLE) ×1 IMPLANT
GOWN STRL REUS W/ TWL XL LVL3 (GOWN DISPOSABLE) IMPLANT
GOWN STRL REUS W/TWL 2XL LVL3 (GOWN DISPOSABLE) IMPLANT
GOWN STRL REUS W/TWL LRG LVL3 (GOWN DISPOSABLE) ×2
GOWN STRL REUS W/TWL XL LVL3 (GOWN DISPOSABLE)
KIT BASIN OR (CUSTOM PROCEDURE TRAY) ×3 IMPLANT
KIT INFUSE MEDIUM (Orthopedic Implant) ×3 IMPLANT
KIT ROOM TURNOVER OR (KITS) ×3 IMPLANT
NEEDLE HYPO 18GX1.5 BLUNT FILL (NEEDLE) IMPLANT
NEEDLE HYPO 21X1.5 SAFETY (NEEDLE) IMPLANT
NEEDLE HYPO 25X1 1.5 SAFETY (NEEDLE) IMPLANT
NS IRRIG 1000ML POUR BTL (IV SOLUTION) ×3 IMPLANT
PACK LAMINECTOMY NEURO (CUSTOM PROCEDURE TRAY) ×3 IMPLANT
PAD ARMBOARD 7.5X6 YLW CONV (MISCELLANEOUS) ×9 IMPLANT
PATTIES SURGICAL .5 X1 (DISPOSABLE) ×3 IMPLANT
PATTIES SURGICAL .5 X3 (DISPOSABLE) IMPLANT
ROD 40MM SPINAL (Rod) ×6 IMPLANT
SCREW SPINE 40X5.5XPA CREO (Screw) ×4 IMPLANT
SCREW SPINE CREO 5.5X40 (Screw) ×8 IMPLANT
SPACER RISE 8X22 8-14MM-10 (Neuro Prosthesis/Implant) ×6 IMPLANT
SPONGE LAP 4X18 X RAY DECT (DISPOSABLE) IMPLANT
SPONGE NEURO XRAY DETECT 1X3 (DISPOSABLE) IMPLANT
SPONGE SURGIFOAM ABS GEL 100 (HEMOSTASIS) ×3 IMPLANT
STAPLER VISISTAT 35W (STAPLE) ×3 IMPLANT
STRIP CLOSURE SKIN 1/2X4 (GAUZE/BANDAGES/DRESSINGS) ×2 IMPLANT
SUT PROLENE 6 0 BV (SUTURE) ×3 IMPLANT
SUT VIC AB 1 CT1 18XBRD ANBCTR (SUTURE) ×2 IMPLANT
SUT VIC AB 1 CT1 8-18 (SUTURE) ×4
SUT VIC AB 2-0 CP2 18 (SUTURE) ×3 IMPLANT
SUT VIC AB 3-0 SH 8-18 (SUTURE) ×3 IMPLANT
SYR 5ML LL (SYRINGE) IMPLANT
TOWEL OR 17X24 6PK STRL BLUE (TOWEL DISPOSABLE) ×3 IMPLANT
TOWEL OR 17X26 10 PK STRL BLUE (TOWEL DISPOSABLE) ×3 IMPLANT
TRAY FOLEY BAG SILVER LF 16FR (SET/KITS/TRAYS/PACK) ×3 IMPLANT
TRAY FOLEY W/METER SILVER 16FR (SET/KITS/TRAYS/PACK) IMPLANT
WATER STERILE IRR 1000ML POUR (IV SOLUTION) ×3 IMPLANT

## 2016-09-06 NOTE — Anesthesia Procedure Notes (Signed)
Procedure Name: Intubation Date/Time: 09/06/2016 7:39 AM Performed by: Shirlyn Goltz Pre-anesthesia Checklist: Patient identified, Emergency Drugs available, Suction available and Patient being monitored Patient Re-evaluated:Patient Re-evaluated prior to inductionOxygen Delivery Method: Circle system utilized Preoxygenation: Pre-oxygenation with 100% oxygen Intubation Type: IV induction Ventilation: Mask ventilation without difficulty and Oral airway inserted - appropriate to patient size Laryngoscope Size: Mac and 3 Grade View: Grade I Tube type: Oral Tube size: 7.0 mm Number of attempts: 1 Airway Equipment and Method: Stylet Placement Confirmation: ETT inserted through vocal cords under direct vision,  positive ETCO2 and breath sounds checked- equal and bilateral Secured at: 22 cm Tube secured with: Tape Dental Injury: Teeth and Oropharynx as per pre-operative assessment

## 2016-09-06 NOTE — Progress Notes (Signed)
Called the MD on call regarding patient's pain medication and MD called back and said to continue with the current regimen as the patient is asleep now.

## 2016-09-06 NOTE — Progress Notes (Signed)
Patient is admitted from PACU. Patient alert and oriented x 4. Patient oriented to room and made comfortable. Will continue to monitor.

## 2016-09-06 NOTE — Transfer of Care (Signed)
Immediate Anesthesia Transfer of Care Note  Patient: Kathleen Terrell  Procedure(s) Performed: Procedure(s) with comments: L4-5 Lumbar Fusion (N/A) - L4-5 Lumbar Fusion  Patient Location: PACU  Anesthesia Type:General  Level of Consciousness: awake, alert , oriented and patient cooperative  Airway & Oxygen Therapy: Patient Spontanous Breathing and Patient connected to nasal cannula oxygen  Post-op Assessment: Report given to RN, Post -op Vital signs reviewed and stable and Patient moving all extremities X 4  Post vital signs: Reviewed and stable  Last Vitals:  Vitals:   09/06/16 0614 09/06/16 1059  BP:    Pulse:    Resp:    Temp: 36.9 C (P) 36.6 C    Last Pain: There were no vitals filed for this visit.       Complications: No apparent anesthesia complications

## 2016-09-06 NOTE — Progress Notes (Signed)
Patient ID: Kathleen Terrell, female   DOB: 12/23/1946, 70 y.o.   MRN: [id number] Stable. C/o incisional pain. No weakness. Family wants a pca morphine

## 2016-09-06 NOTE — Progress Notes (Signed)
Pt set up on PCA Vital signs are stable except for Breathe per minute, which the patient is requiring frequent prompting from daughter who is staying in room with Pt to breath, will continue to monitor.

## 2016-09-06 NOTE — Anesthesia Postprocedure Evaluation (Signed)
Anesthesia Post Note  Patient: Kathleen Terrell  Procedure(s) Performed: Procedure(s) (LRB): L4-5 Lumbar Fusion (N/A)  Patient location during evaluation: PACU Anesthesia Type: General Level of consciousness: sedated and patient cooperative Pain management: pain level controlled Vital Signs Assessment: post-procedure vital signs reviewed and stable Respiratory status: spontaneous breathing Cardiovascular status: stable Anesthetic complications: no    Last Vitals:  Vitals:   09/06/16 1143 09/06/16 1201  BP:  135/61  Pulse: 83 85  Resp: 20 20  Temp: 36.3 C 37.1 C    Last Pain:  Vitals:   09/06/16 1223  TempSrc:   PainSc: 10-Worst pain ever                 Nolon Nations

## 2016-09-06 NOTE — Progress Notes (Signed)
Patient still complaining of pain after being medicated with ordered medication. MD paged to notify. Awaiting a call back.

## 2016-09-06 NOTE — Op Note (Signed)
Kathleen Terrell, ALLREAD NO.:  [id number]  MEDICAL RECORD NO.:  [id number]  LOCATION:  [facility]                         FACILITY:  [facility]  PHYSICIAN:  Leeroy Cha, M.D.   DATE OF BIRTH:  10-26-1946  DATE OF PROCEDURE:  09/06/2016 DATE OF DISCHARGE:                              OPERATIVE REPORT   PREOPERATIVE DIAGNOSES:  Lumbar stenosis with grade 1 spondylolisthesis. Neurogenic claudication.  Chronic radiculopathy.  POSTOPERATIVE DIAGNOSES:  Lumbar stenosis with grade 1 spondylolisthesis. Neurogenic claudication.  Chronic radiculopathy.  PROCEDURES:  L4 Gill procedure, which involved removal of spinous process of L4 lamina and the facet.  Bilaterally L4-L5 diskectomy more than begun to be able to introduce two cages.  Pedicle screws, L4-L5. Posterolateral arthrodesis with BMP and autograft.  Cell Saver.  C-arm.  SURGEON:  Leeroy Cha, M.D.  ASSISTANT:  Dr. Kathyrn Sheriff.  CLINICAL HISTORY:  The patient had been seen by me for one more year complaining of back pain radiation to both legs.  MRI showed that the patient has stenosis at the level of 4-5 with mobility with flexion- extension of the lumbar spine.  The patient knew the risk as well as the benefit.  I sat with her and her daughter on several occasions.  DESCRIPTION OF PROCEDURE:  The patient was taken to the OR, and after intubation, she was positioned on prone manner.  The back was cleaned with DuraPrep and drapes were applied.  Midline incision was made from L4 down to L4-L5.  X-ray showed indeed we were right at the level of L4. From then on, we dissected all the way laterally until we were able to see the transverse process of L4 and L5.  We started with the Gill procedure with removal of the spinous process of L4, lamina and degenerative facet.  The patient has thick calcified ligament using the 1 and 2-mm Kerrison punch.  We decompressed the thecal sac as well as the L4 and L5 nerve root.   Retraction of the thecal sac was made in the right side to enter the disk and later on the left side.  Total gross diskectomy more than normal to be able to introduce two cages, which were expanded to 17 was done.  Lateral lumbar spine x-ray showed good position of the cages.  From then on, the rest of the disk was filled up using a mix of BMP and autograft.  Then using the drill, we made a hole in the pedicle of L4 and L5.  We were able to introduce four screws of 5.5 x 40.  Prior to introduction, we feel all the four quadrants just to be sure we were surrounded by bone.  Two rods were used as well as four Capps.  From then on, we went laterally to the transverse process of L4- L5 and the facet bilaterally and a mix of BMP and autograft was done.  There was a small opening in the dura, which was taken care with a single stitch. Valsalva maneuver three times was negative.  The area was irrigated. From then on, the wound was closed with three different layers of Vicryl and staples for the skin.  ______________________________ Leeroy Cha, M.D.     EB/MEDQ  D:  09/06/2016  T:  09/06/2016  Job:  [id number]

## 2016-09-06 NOTE — Evaluation (Signed)
Physical Therapy Evaluation Patient Details Name: Kathleen Terrell MRN: [id number] DOB: 09-17-1946 Today's Date: 09/06/2016   History of Present Illness  Patient is a 70 y/o female with hx of HTN, HLD, DM presents s/p L4-5 PLIF.   Clinical Impression  Patient presents with pain, lethargy and post surgical deficits s/p above surgery. Requires Mod A to get to EOB with cues for log roll technique. Mobility assessment limited due to nausea and lethargy. Education on back precautions. Tolerated sitting EOB ~10 mins. Encouraged OOB to chair with RN later. Pt plans to discharge home with support of 2 daughters providing 24/7 S. Will follow acutely to maximize independence and mobility prior to return home.    Follow Up Recommendations Home health PT;Supervision for mobility/OOB    Equipment Recommendations  Rolling walker with 5" wheels    Recommendations for Other Services OT consult     Precautions / Restrictions Precautions Precautions: Back Precaution Booklet Issued: No Precaution Comments: Reviewed back precautions. Required Braces or Orthoses: Spinal Brace Spinal Brace: Lumbar corset Restrictions Weight Bearing Restrictions: No      Mobility  Bed Mobility Overal bed mobility: Needs Assistance Bed Mobility: Rolling;Sidelying to Sit;Sit to Sidelying Rolling: Min guard Sidelying to sit: Mod assist;HOB elevated       General bed mobility comments: HOB elevated 20 degrees; cues for log roll technique. Assist to elevate trunk to get to EOB. Able to bring BLEs to EOB. Assist to bring BLEs into bed to return to supine.  Transfers Overall transfer level:  (Deferred secondary to nausea, lethargy.)                  Ambulation/Gait                Stairs            Wheelchair Mobility    Modified Rankin (Stroke Patients Only)       Balance Overall balance assessment: Needs assistance Sitting-balance support: Feet supported;Bilateral upper extremity  supported Sitting balance-Leahy Scale: Fair Sitting balance - Comments: Sitting EOB requiring BUE support posteriorly.                                     Pertinent Vitals/Pain Pain Assessment: Faces Faces Pain Scale: Hurts whole lot Pain Location: back at surgical site Pain Descriptors / Indicators: Operative site guarding;Sore Pain Intervention(s): Monitored during session;Repositioned;Limited activity within patient's tolerance;Patient requesting pain meds-RN notified    Home Living Family/patient expects to be discharged to:: Private residence Living Arrangements: Children Available Help at Discharge: Family;Available 24 hours/day Type of Home: House Home Access: Level entry     Home Layout: Two level;Able to live on main level with bedroom/bathroom Home Equipment: Kasandra Knudsen - single point;Shower seat - built in      Prior Function Level of Independence: Independent         Comments: Drives, does IADLs.     Hand Dominance        Extremity/Trunk Assessment   Upper Extremity Assessment: Defer to OT evaluation           Lower Extremity Assessment: Generalized weakness (Difficult to assess due to decreased level of arousal and nausea.)         Communication   Communication: No difficulties  Cognition Arousal/Alertness: Lethargic;Suspect due to medications Behavior During Therapy: [facility] for tasks assessed/performed Overall Cognitive Status: Difficult to assess  General Comments General comments (skin integrity, edema, etc.): 2 daughters present during session.    Exercises        Assessment/Plan    PT Assessment Patient needs continued PT services  PT Diagnosis Acute pain;Difficulty walking   PT Problem List Decreased strength;Decreased mobility;Decreased knowledge of precautions;Decreased activity tolerance;Decreased balance;Decreased cognition;Pain;Decreased knowledge of use of DME  PT Treatment  Interventions DME instruction;Therapeutic activities;Gait training;Therapeutic exercise;Patient/family education;Balance training;Functional mobility training   PT Goals (Current goals can be found in the Care Plan section) Acute Rehab PT Goals Patient Stated Goal: to get out of here as soon as possible PT Goal Formulation: With patient/family Time For Goal Achievement: 09/20/16 Potential to Achieve Goals: Good    Frequency Min 5X/week   Barriers to discharge        Co-evaluation               End of Session   Activity Tolerance: Patient limited by lethargy;Other (comment);Patient limited by pain (nausea) Patient left: in bed;with call bell/phone within reach;with family/visitor present;with bed alarm set;with SCD's reapplied Nurse Communication: Mobility status         Time: [id number] PT Time Calculation (min) (ACUTE ONLY): 24 min   Charges:   PT Evaluation $PT Eval Moderate Complexity: 1 Procedure PT Treatments $Therapeutic Activity: 8-22 mins   PT G Codes:        Ulysse Siemen A Judene Logue 09/06/2016, 4:09 PM Wray Kearns, [place], DPT [phone]

## 2016-09-07 LAB — GLUCOSE, CAPILLARY: GLUCOSE-CAPILLARY: 99 mg/dL (ref 65–99)

## 2016-09-07 MED ORDER — STROKE: EARLY STAGES OF RECOVERY BOOK
Freq: Once | Status: DC
  Filled 2016-09-07: qty 1

## 2016-09-07 NOTE — Progress Notes (Signed)
Patient ID: Kathleen Terrell, female   DOB: 28-Apr-1946, 70 y.o.   MRN: [id number] Feeling better.c/o incisional pain, no weakness

## 2016-09-07 NOTE — Progress Notes (Signed)
Patient/family requested to have PCA d/c and continue to oral agents for pain management at this time. PCA d/c'ed at this time. Pt is working with PT at this time. Will continue to monitor.  Ave Filter, RN

## 2016-09-07 NOTE — Progress Notes (Signed)
Physical Therapy Treatment Patient Details Name: Kathleen Terrell MRN: [id number] DOB: Jun 30, 1946 Today's Date: 09/07/2016    History of Present Illness Patient is a 70 y/o female with hx of HTN, HLD, DM presents s/p L4-5 PLIF.     PT Comments    Patient tolerated very short distance gait this session with min A for transfers/ambulation. Limited due to dizziness and low BP. Continue to progress as tolerated with anticipated d/c home with HHPT.   Follow Up Recommendations  Home health PT;Supervision for mobility/OOB     Equipment Recommendations  Rolling walker with 5" wheels    Recommendations for Other Services OT consult     Precautions / Restrictions Precautions Precautions: Back Precaution Booklet Issued: No Precaution Comments: Reviewed back precautions. Required Braces or Orthoses: Spinal Brace Spinal Brace: Lumbar corset Restrictions Weight Bearing Restrictions: No    Mobility  Bed Mobility               General bed mobility comments: on BSC with NT upon arrival  Transfers Overall transfer level: Needs assistance Equipment used: Rolling walker (2 wheeled) Transfers: Sit to/from Stand Sit to Stand: Min assist         General transfer comment: X 1 from [facility] and recliner; cues for hand placement and technique; increased time to achieve upright posture; assist to power up into standing  Ambulation/Gait Ambulation/Gait assistance: Min assist Ambulation Distance (Feet): 6 Feet Assistive device: Rolling walker (2 wheeled) Gait Pattern/deviations: Step-through pattern;Decreased step length - right;Decreased step length - left     General Gait Details: cues for posture and use of AD; limited by dizziness    Stairs            Wheelchair Mobility    Modified Rankin (Stroke Patients Only)       Balance Overall balance assessment: Needs assistance Sitting-balance support: Feet supported;Bilateral upper extremity supported Sitting balance-Leahy  Scale: Fair       Standing balance-Leahy Scale: Poor                      Cognition Arousal/Alertness: Awake/alert Behavior During Therapy: WFL for tasks assessed/performed Overall Cognitive Status: Within Functional Limits for tasks assessed                      Exercises      General Comments General comments (skin integrity, edema, etc.): daughter present; BP 107/49 post short distance ambulation and up to 149/57 after rest; SpO2 96% on RA end of session      Pertinent Vitals/Pain Pain Assessment: 0-10 Pain Score: 6  Pain Location: back Pain Descriptors / Indicators: Sore Pain Intervention(s): Limited activity within patient's tolerance;Monitored during session;Premedicated before session;Repositioned    Home Living                      Prior Function            PT Goals (current goals can now be found in the care plan section) Acute Rehab PT Goals Patient Stated Goal: none stated Progress towards PT goals: Progressing toward goals    Frequency  Min 5X/week    PT Plan Current plan remains appropriate    Co-evaluation             End of Session Equipment Utilized During Treatment: Gait belt;Back brace Activity Tolerance: Treatment limited secondary to medical complications (Comment) (BP; dizziness) Patient left: in chair;with call bell/phone within reach;with family/visitor present;with nursing/sitter in  room;with chair alarm set     Time: 1001-1036 PT Time Calculation (min) (ACUTE ONLY): 35 min  Charges:  $Gait Training: 8-22 mins $Therapeutic Activity: 8-22 mins                    G Codes:      Salina April, PTA Pager: [phone]   09/07/2016, 1:26 PM

## 2016-09-07 NOTE — Evaluation (Signed)
Occupational Therapy Evaluation Patient Details Name: Kathleen Terrell MRN: [id number] DOB: Nov 10, 1946 Today's Date: 09/07/2016    History of Present Illness Patient is a 70 y/o female with hx of HTN, HLD, DM presents s/p L4-5 PLIF.    Clinical Impression   Pt reports she was independent with ADL PTA. Currently pt overall min assist for functional mobility and max assist for ADL. Began back, safety, and ADL education with pt and family. Pt planning to d/c home with 24/7 supervision from family. Recommending HHOT for follow up to maximize pts independence and safety with ADL and functional mobility upon return home. Pt would benefit from continued skilled OT to address established goals.    Follow Up Recommendations  Home health OT;Supervision/Assistance - 24 hour    Equipment Recommendations  3 in 1 bedside comode    Recommendations for Other Services       Precautions / Restrictions Precautions Precautions: Back Precaution Booklet Issued: No Precaution Comments: Educated pt on back precautions Required Braces or Orthoses: Spinal Brace Spinal Brace: Lumbar corset;Applied in sitting position Restrictions Weight Bearing Restrictions: No      Mobility Bed Mobility               General bed mobility comments: on BSC upon arrival  Transfers Overall transfer level: Needs assistance Equipment used: Rolling walker (2 wheeled) Transfers: Sit to/from Stand Sit to Stand: Min assist         General transfer comment: Min assist to boost up from [facility] x1, EOB x1. Cues for hand placement and technique. Increased time required.    Balance Overall balance assessment: Needs assistance Sitting-balance support: Feet supported;No upper extremity supported Sitting balance-Leahy Scale: Fair     Standing balance support: Bilateral upper extremity supported Standing balance-Leahy Scale: Poor Standing balance comment: RW for support                            ADL  Overall ADL's : Needs assistance/impaired Eating/Feeding: Set up;Sitting   Grooming: Set up;Sitting;Min guard   Upper Body Bathing: Set up;Min guard;Sitting   Lower Body Bathing: Maximal assistance;Sit to/from stand   Upper Body Dressing : Maximal assistance;Sitting Upper Body Dressing Details (indicate cue type and reason): Educated pt and daughters on donning back brace Lower Body Dressing: Maximal assistance;Sit to/from stand   Toilet Transfer: Ambulation;BSC;RW;Moderate Print production planner Details (indicate cue type and reason): Pt on BSC upon arrival. Pt able to perform sit to stand with min assist to boost up and min assist for functional mobility. Toileting- Clothing Manipulation and Hygiene: Total assistance;Sit to/from stand Toileting - Clothing Manipulation Details (indicate cue type and reason): for peircare only     Functional mobility during ADLs: Minimal assistance;Rolling walker General ADL Comments: Educated pt on back precautions and donning back brace. Discussed need for follow up OT with pt and family; they are agreeable. 2 very supportive daughters present throughout session.     Vision Vision Assessment?: No apparent visual deficits   Perception     Praxis      Pertinent Vitals/Pain Pain Assessment: Faces Pain Score: 6  Faces Pain Scale: Hurts even more Pain Location: back, legs Pain Descriptors / Indicators: Radiating;Sharp;Sore Pain Intervention(s): Monitored during session;Repositioned     Hand Dominance     Extremity/Trunk Assessment Upper Extremity Assessment Upper Extremity Assessment: Generalized weakness   Lower Extremity Assessment Lower Extremity Assessment: Defer to PT evaluation   Cervical / Trunk Assessment Cervical /  Trunk Assessment: Other exceptions Cervical / Trunk Exceptions: s/p spinal sx   Communication Communication Communication: No difficulties   Cognition Arousal/Alertness: Awake/alert Behavior During Therapy:  WFL for tasks assessed/performed Overall Cognitive Status: Within Functional Limits for tasks assessed                     General Comments       Exercises       Shoulder Instructions      Home Living Family/patient expects to be discharged to:: Private residence Living Arrangements: Children Available Help at Discharge: Family;Available 24 hours/day Type of Home: House Home Access: Level entry     Home Layout: Two level;Able to live on main level with bedroom/bathroom     Bathroom Shower/Tub: Occupational psychologist: Standard     Home Equipment: Cane - single point;Shower seat - built in          Prior Functioning/Environment Level of Independence: Independent        Comments: Drives, does IADLs.    OT Diagnosis: Generalized weakness;Acute pain   OT Problem List: Decreased strength;Decreased activity tolerance;Impaired balance (sitting and/or standing);Decreased knowledge of use of DME or AE;Decreased knowledge of precautions;Obesity;Pain   OT Treatment/Interventions: Self-care/ADL training;Energy conservation;DME and/or AE instruction;Therapeutic activities;Patient/family education;Balance training    OT Goals(Current goals can be found in the care plan section) Acute Rehab OT Goals Patient Stated Goal: get back to being independent OT Goal Formulation: With patient/family Time For Goal Achievement: 09/21/16 Potential to Achieve Goals: Good ADL Goals Pt Will Perform Grooming: with supervision;standing Pt Will Perform Lower Body Bathing: with supervision;with adaptive equipment;sit to/from stand Pt Will Perform Lower Body Dressing: with supervision;with adaptive equipment;sit to/from stand Pt Will Transfer to Toilet: with supervision;ambulating;bedside commode Pt Will Perform Toileting - Clothing Manipulation and hygiene: with supervision;with adaptive equipment;sit to/from stand Additional ADL Goal #1: Pt will independently verbally recall  3/3 back precautions and maintain throughout ADL. Additional ADL Goal #2: Pt will don/doff back brace with set up as precursor for ADL and functional mobility.  OT Frequency: Min 3X/week   Barriers to D/C:            Co-evaluation              End of Session Equipment Utilized During Treatment: Gait belt;Rolling walker;Back brace Nurse Communication: Mobility status  Activity Tolerance: Patient tolerated treatment well Patient left: in chair;with call bell/phone within reach;with family/visitor present   Time: 1451-1516 OT Time Calculation (min): 25 min Charges:  OT General Charges $OT Visit: 1 Procedure OT Evaluation $OT Eval Moderate Complexity: 1 Procedure OT Treatments $Self Care/Home Management : 8-22 mins G-Codes:     Binnie Kand M.S., OTR/L Pager: [phone]  09/07/2016, 3:27 PM

## 2016-09-07 NOTE — Progress Notes (Signed)
Patient requires repeated physical stimulation, unable to stay awake without for jeuro assessment. Patient disoriented to place and time. Vitals taken, daughter at bedside, RN will continue to monitor.

## 2016-09-07 NOTE — Clinical Social Work Note (Signed)
CSW consulted for New SNF. CSW discussed pt with RN, RNCM, and Charge RN during progression. PT is recommending HHPT. RNCM will follow for discharge planning needs. CSW is signing off as no further needs identified.   Darden Dates, MSW, LCSW  Clinical Social Worker [phone]

## 2016-09-07 NOTE — Progress Notes (Signed)
Repeat page to neurosurgery, spoke with [name] x 2. RN will continue to monitor.

## 2016-09-07 NOTE — Progress Notes (Signed)
Page sent to on-call neurosurgery. RN will continue to monitor.

## 2016-09-08 MED ORDER — BISACODYL 5 MG PO TBEC
5.0000 mg | DELAYED_RELEASE_TABLET | Freq: Every day | ORAL | Status: DC | PRN
  Administered 2016-09-08: 5 mg via ORAL
  Filled 2016-09-08 (×2): qty 1

## 2016-09-08 MED ORDER — HYDROCODONE-ACETAMINOPHEN 5-325 MG PO TABS
1.0000 | ORAL_TABLET | ORAL | Status: DC | PRN
  Administered 2016-09-08 – 2016-09-09 (×10): 1 via ORAL
  Administered 2016-09-10: 2 via ORAL
  Administered 2016-09-10 (×2): 1 via ORAL
  Filled 2016-09-08: qty 1
  Filled 2016-09-08: qty 2
  Filled 2016-09-08 (×13): qty 1

## 2016-09-08 MED ORDER — MINERAL OIL RE ENEM
1.0000 | ENEMA | Freq: Once | RECTAL | Status: AC
  Administered 2016-09-08: 1 via RECTAL
  Filled 2016-09-08: qty 1

## 2016-09-08 MED ORDER — HYDROCODONE-ACETAMINOPHEN 5-325 MG PO TABS: 1.0000 | ORAL_TABLET | ORAL | Status: DC | PRN

## 2016-09-08 NOTE — Care Management Note (Signed)
Case Management Note  Patient Details  Name: Kathleen Terrell MRN: [id number] Date of Birth: [birth year]  Subjective/Objective:   NCM spoke with patient and daughter, [name], daughter states patient will be living with her in [place],  [place] gave daughter  Who is a Therapist, sports the agency list for [facility], she chose [facility] for [place], [place].  She chose [facility] for 3 n 1 and rolling walker.  Referral made to [facility] with [place] , she states they can cover the zip code area where daughter [name] lives and they will be able to do soc Friday or Saturday.  Referral made to [facility] for rolling walker and for 3 n 1.  [name] states her sister will be with her mother while she is working so patient will have someone with her for 24 hrs .  NCM notified RN to let MD know to put in orders for HHPT and [place] , also on MD sticky notes.  NCM will cont to follow for dc needs.                   Action/Plan:   Expected Discharge Date:                  Expected Discharge Plan:  [place]  In-House Referral:     Discharge planning Services  CM Consult  Post Acute Care Choice:  Home Health, Durable Medical Equipment Choice offered to:  Adult Children  DME Arranged:  3-N-1, Walker rolling DME Agency:  [place] Arranged:  PT, OT [place] Agency:  [facility] (now [facility])  Status of Service:  Completed, signed off  If discussed at [place] of Stay Meetings, dates discussed:    Additional Comments:  Zenon Mayo, RN 09/08/2016, 12:31 PM

## 2016-09-08 NOTE — Progress Notes (Signed)
Physical Therapy Treatment Patient Details Name: Kathleen Terrell MRN: [id number] DOB: 05-Aug-1946 Today's Date: 09/08/2016    History of Present Illness Patient is a 70 y/o female with hx of HTN, HLD, DM presents s/p L4-5 PLIF.     PT Comments    Patient is progressing toward mobility goals. Pt has most difficulty with bed mobility. Tolerated increased gait distance. Daughter present and encouraging pt to mobilize. Reviewed precautions with pt. Current plan remains appropriate.   Follow Up Recommendations  Home health PT;Supervision for mobility/OOB     Equipment Recommendations  Rolling walker with 5" wheels    Recommendations for Other Services OT consult     Precautions / Restrictions Precautions Precautions: Back Precaution Booklet Issued: No Precaution Comments: Reviewed back precautions. Required Braces or Orthoses: Spinal Brace Spinal Brace: Lumbar corset Restrictions Weight Bearing Restrictions: No    Mobility  Bed Mobility Overal bed mobility: Needs Assistance Bed Mobility: Rolling;Sidelying to Sit Rolling: Min guard Sidelying to sit: Mod assist       General bed mobility comments: mod A to elevate trunk into sitting and bring bilat LE off of EOB; cues for sequencing and technique; use of rail  Transfers Overall transfer level: Needs assistance Equipment used: Rolling walker (2 wheeled) Transfers: Sit to/from Stand Sit to Stand: Min guard;Min assist         General transfer comment: min A from EOB and min guard to/from BSC and min A for safe descent to recliner; cues for hand placement and technqiue; assist to power up   Ambulation/Gait Ambulation/Gait assistance: Min guard Ambulation Distance (Feet): 110 Feet Assistive device: Rolling walker (2 wheeled) Gait Pattern/deviations: Step-through pattern;Decreased stride length Gait velocity: decreased   General Gait Details: cues for bilat heel strike and step height; pt able to correct with increased  distance; several brief breaks due to bilat UE weakness; heavy reliance on RW   Stairs            Wheelchair Mobility    Modified Rankin (Stroke Patients Only)       Balance     Sitting balance-Leahy Scale: Fair       Standing balance-Leahy Scale: Poor                      Cognition Arousal/Alertness: Awake/alert Behavior During Therapy: WFL for tasks assessed/performed Overall Cognitive Status: Within Functional Limits for tasks assessed       Memory: Decreased recall of precautions              Exercises      General Comments General comments (skin integrity, edema, etc.): daughter present and encouraging pt to mobilize      Pertinent Vitals/Pain Pain Assessment: 0-10 Pain Score: 6  Pain Location: back pain and abdominal discomfort Pain Descriptors / Indicators: Sore;Grimacing;Guarding;Discomfort Pain Intervention(s): Limited activity within patient's tolerance;Monitored during session;Repositioned;RN gave pain meds during session    Home Living                      Prior Function            PT Goals (current goals can now be found in the care plan section) Acute Rehab PT Goals Patient Stated Goal: none stated Progress towards PT goals: Progressing toward goals    Frequency  Min 5X/week    PT Plan Current plan remains appropriate    Co-evaluation             End of  Session Equipment Utilized During Treatment: Gait belt;Back brace Activity Tolerance: Patient tolerated treatment well Patient left: in chair;with call bell/phone within reach;with family/visitor present;with nursing/sitter in room     Time: 1132-1202 PT Time Calculation (min) (ACUTE ONLY): 30 min  Charges:  $Gait Training: 8-22 mins $Therapeutic Activity: 8-22 mins                    G Codes:      Salina April, PTA Pager: [phone]   09/08/2016, 12:12 PM

## 2016-09-08 NOTE — Progress Notes (Signed)
Patient ID: Kathleen Terrell, female   DOB: 1946-10-24, 70 y.o.   MRN: [id number] Constipated, air gasses, poor ambulation. Confused last night. See orders

## 2016-09-09 MED ORDER — MAGNESIUM CITRATE PO SOLN
1.0000 | Freq: Once | ORAL | Status: AC
  Administered 2016-09-09: 1 via ORAL
  Filled 2016-09-09: qty 296

## 2016-09-09 NOTE — Consult Note (Signed)
[facility] CM Primary Care Navigator  09/09/2016  [name] 1946-09-01 [id number]  Met with patient and family at the bedside to identify possible discharge needs. Patient states worsening back pain had led to the decision for this back surgery. Patient endorses [name] PA-C at [facility] in [place] as the primary care provider.    Patient verbalized using [employer] to obtain regular medications and [facility] in [place] for immediate needs. She manages her medications independently using "pill box system.    Daughters ([name] and [name]) provide transportation to her doctors' appointments and both are her primary caregivers. [name] states that patient will stay at her house in [place] as patient recovers.   Patient and daughter voiced understanding to call primary care provider's office to schedule a  post discharge follow-up appointment within a week or sooner as needed. Patient letter given to daughter to serve as reminder.  Patient expressed no needs or concerns at this time.          For additional questions please contact:  Edwena Felty A. Tanyia Grabbe, BSN, RN-BC [facility] PRIMARY CARE Navigator Cell: [phone]

## 2016-09-09 NOTE — Progress Notes (Signed)
Patient ID: Kathleen Terrell, female   DOB: 1946/10/10, 70 y.o.   MRN: [id number] Stable, incisional pain. No weak\ness. Having sole abdominal pain, no tenderness, flatus.

## 2016-09-09 NOTE — Progress Notes (Signed)
Physical Therapy Treatment Patient Details Name: Kathleen Terrell MRN: [id number] DOB: 1946/07/25 Today's Date: 09/09/2016    History of Present Illness Patient is a 70 y/o female with hx of HTN, HLD, DM presents s/p L4-5 PLIF.     PT Comments    Patient tolerated increased gait distance and with improved gait mechanics. Reviewed precautions and pt able to recall 3/3. Current plan remains appropriate.   Follow Up Recommendations  Home health PT;Supervision for mobility/OOB     Equipment Recommendations  Rolling walker with 5" wheels    Recommendations for Other Services OT consult     Precautions / Restrictions Precautions Precautions: Back Precaution Booklet Issued: No Precaution Comments: Reviewed back precautions; pt recalled 3/3 precautions Required Braces or Orthoses: Spinal Brace Spinal Brace: Lumbar corset Restrictions Weight Bearing Restrictions: No    Mobility  Bed Mobility Overal bed mobility: Needs Assistance Bed Mobility: Sit to Sidelying;Rolling Rolling: Supervision       Sit to sidelying: Min assist General bed mobility comments: no rails and bed elevated to simulate home; cues for sequencing/technique and assist to elevate bilat LE into bed with pt able to lift them most of the way; reviewed log roll to go supine to sit  Transfers Overall transfer level: Needs assistance Equipment used: Rolling walker (2 wheeled) Transfers: Sit to/from Stand Sit to Stand: Min assist;+2 physical assistance         General transfer comment: min A +2 first trial from recliner and min A second trial from recliner; cues safe hand placement/technique  Ambulation/Gait Ambulation/Gait assistance: Min guard Ambulation Distance (Feet): 175 Feet Assistive device: Rolling walker (2 wheeled) Gait Pattern/deviations: Step-through pattern;Decreased stride length;Decreased dorsiflexion - right;Decreased dorsiflexion - left Gait velocity: decreased   General Gait Details: cues  for posture, bilat heel strike, and increased bilat step length; pt with tendency to shuffle but able to correct with cues   Stairs            Wheelchair Mobility    Modified Rankin (Stroke Patients Only)       Balance Overall balance assessment: Needs assistance Sitting-balance support: Feet supported;No upper extremity supported Sitting balance-Leahy Scale: Fair     Standing balance support: Single extremity supported;During functional activity Standing balance-Leahy Scale: Poor                      Cognition Arousal/Alertness: Awake/alert Behavior During Therapy: WFL for tasks assessed/performed Overall Cognitive Status: Within Functional Limits for tasks assessed       Memory: Decreased recall of precautions              Exercises      General Comments General comments (skin integrity, edema, etc.): daughter present for session      Pertinent Vitals/Pain Pain Assessment: Faces Faces Pain Scale: Hurts little more Pain Location: back Pain Descriptors / Indicators: Guarding;Sore Pain Intervention(s): Limited activity within patient's tolerance;Premedicated before session;Monitored during session;Repositioned    Home Living                      Prior Function            PT Goals (current goals can now be found in the care plan section) Acute Rehab PT Goals Patient Stated Goal: none stated Progress towards PT goals: Progressing toward goals    Frequency    Min 5X/week      PT Plan Current plan remains appropriate    Co-evaluation  End of Session Equipment Utilized During Treatment: Gait belt;Back brace Activity Tolerance: Patient tolerated treatment well Patient left: with call bell/phone within reach;with family/visitor present;in bed     Time: 1447-1510 PT Time Calculation (min) (ACUTE ONLY): 23 min  Charges:  $Gait Training: 8-22 mins $Therapeutic Activity: 8-22 mins                    G  Codes:      Salina April, PTA Pager: [phone]   09/09/2016, 4:02 PM

## 2016-09-09 NOTE — Progress Notes (Signed)
Occupational Therapy Treatment Patient Details Name: Kathleen Terrell MRN: [id number] DOB: 03-Oct-1946 Today's Date: 09/09/2016    History of present illness Patient is a 70 y/o female with hx of HTN, HLD, DM presents s/p L4-5 PLIF.    OT comments  Pt making gradual progress toward OT goals this session. Able to complete toilet transfer ambulating to bathroom with min guard assist. Completed peri care with lateral leans and set up. Pt with difficulty maintaining standing balance during grooming standing at the sink with single extremity supported. Pt only able to recall 1/3 back precautions; reviewed all precautions with pt and family and provided handout. D/c plan remains appropriate. Will continue to follow acutely.   Follow Up Recommendations  Home health OT;Supervision/Assistance - 24 hour    Equipment Recommendations  3 in 1 bedside comode    Recommendations for Other Services      Precautions / Restrictions Precautions Precautions: Back Precaution Booklet Issued: Yes (comment) Precaution Comments: Pt able to recall 1/3 back precautions. Reviewed all precautions with pt and family and provided handout. Required Braces or Orthoses: Spinal Brace Spinal Brace: Lumbar corset Restrictions Weight Bearing Restrictions: No       Mobility Bed Mobility               General bed mobility comments: Pt OOB in chair upon arrival.  Transfers Overall transfer level: Needs assistance Equipment used: Rolling walker (2 wheeled) Transfers: Sit to/from Stand Sit to Stand: Min guard         General transfer comment: Increased time needed and cues for hand placement and technique. Difficulty from low chair but pt able to perform with min guard for safety and balance.    Balance Overall balance assessment: Needs assistance Sitting-balance support: Feet supported;No upper extremity supported Sitting balance-Leahy Scale: Fair     Standing balance support: Single extremity  supported;During functional activity Standing balance-Leahy Scale: Poor                     ADL Overall ADL's : Needs assistance/impaired     Grooming: Min guard;Standing;Wash/dry hands Grooming Details (indicate cue type and reason): Balance unsteady when UE unsupported. Educated on use of 2 cups for oral care.             Lower Body Dressing: Maximal assistance;Sit to/from stand Lower Body Dressing Details (indicate cue type and reason): Pt unable to cross foot over opposite knee. Educated on compensatory strategies; daughter to assist as needed. Toilet Transfer: Min guard;Ambulation;BSC;RW   Toileting- Clothing Manipulation and Hygiene: Set up;Supervision/safety;Sitting/lateral lean Toileting - Clothing Manipulation Details (indicate cue type and reason): for peri care only. Educated on what she can use for toilet aide if needed.   Tub/Shower Transfer Details (indicate cue type and reason): Educated pt and daughter on shower transfer technique and use of 3 in 1 in shower as a seat. Functional mobility during ADLs: Min guard;Rolling walker General ADL Comments: Reviewed back precautions with pt and family. Educated on maintaining precautions during functional activities, keeping frequently used items at counter top height, supervision for safety with shower transfers.       Vision                     Perception     Praxis      Cognition   Behavior During Therapy: [facility] for tasks assessed/performed Overall Cognitive Status: Within Functional Limits for tasks assessed       Memory: Decreased recall of  precautions               Extremity/Trunk Assessment               Exercises     Shoulder Instructions       General Comments      Pertinent Vitals/ Pain       Pain Assessment: Faces Faces Pain Scale: Hurts even more Pain Location: back Pain Descriptors / Indicators: Grimacing;Sore;Discomfort Pain Intervention(s): Monitored during  session;Patient requesting pain meds-RN notified  Home Living                                          Prior Functioning/Environment              Frequency  Min 3X/week        Progress Toward Goals  OT Goals(current goals can now be found in the care plan section)  Progress towards OT goals: Progressing toward goals  Acute Rehab OT Goals Patient Stated Goal: go home OT Goal Formulation: With patient/family  Plan Discharge plan remains appropriate    Co-evaluation                 End of Session Equipment Utilized During Treatment: Rolling walker;Back brace   Activity Tolerance Patient tolerated treatment well   Patient Left in chair;with call bell/phone within reach;with family/visitor present   Nurse Communication Patient requests pain meds        Time: [id number] OT Time Calculation (min): 27 min  Charges: OT General Charges $OT Visit: 1 Procedure OT Treatments $Self Care/Home Management : 23-37 mins  Binnie Kand M.S., OTR/L Pager: [phone]  09/09/2016, 2:18 PM

## 2016-09-09 NOTE — Care Management Important Message (Signed)
Important Message  Patient Details  Name: Kathleen Terrell MRN: [id number] Date of Birth: 08-13-1946   Medicare Important Message Given:  Yes    Hollyanne Schloesser Montine Circle 09/09/2016, 12:50 PM

## 2016-09-10 MED ORDER — HYDROCODONE-ACETAMINOPHEN 5-325 MG PO TABS: 1.0000 | ORAL_TABLET | Freq: Four times a day (QID) | ORAL | 0 refills | Status: DC | PRN

## 2016-09-10 MED ORDER — METHOCARBAMOL 500 MG PO TABS: 500.0000 mg | ORAL_TABLET | Freq: Three times a day (TID) | ORAL | 1 refills | Status: DC | PRN

## 2016-09-10 NOTE — Discharge Summary (Signed)
Physician Discharge Summary  Patient ID: Kathleen Terrell MRN: [id number] DOB/AGE: January 10, 1946 70 y.o.  Admit date: 09/06/2016 Discharge date: 09/10/2016  Admission Diagnoses: spondylolisthesis   Discharge Diagnoses: same   Discharged Condition: stable  Hospital Course: The patient was admitted on 09/06/2016 and taken to the operating room where the patient underwent PLIF. The patient tolerated the procedure well and was taken to the recovery room and then to the floor in stable condition. The hospital course was routine. There were no complications. The wound remained clean dry and intact. Pt had appropriate back soreness. No complaints of leg pain or new N/T/W. The patient remained afebrile with stable vital signs, and tolerated a regular diet. The patient continued to increase activities, and pain was well controlled with oral pain medications.   Consults: None  Significant Diagnostic Studies:  Results for orders placed or performed during the hospital encounter of 09/06/16  Glucose, capillary  Result Value Ref Range   Glucose-Capillary 102 (H) 65 - 99 mg/dL  Glucose, capillary  Result Value Ref Range   Glucose-Capillary 108 (H) 65 - 99 mg/dL   Comment 1 Notify RN    Comment 2 Document in Chart   Glucose, capillary  Result Value Ref Range   Glucose-Capillary 99 65 - 99 mg/dL   Comment 1 Notify RN    Comment 2 Document in Chart     Dg Lumbar Spine 2-3 Views  Result Date: 09/06/2016 CLINICAL DATA:  L4-L5 PLIF EXAM: DG C-ARM 61-120 MIN; LUMBAR SPINE - 2-3 VIEW COMPARISON:  Earlier intraoperative localization image of 09/06/2016 as well as preoperative MRI lumbar spine 08/25/2016 FLUOROSCOPY TIME:  0 minutes 13 seconds Images obtained: 2 FINDINGS: Five lumbar vertebra labeled on preoperative MRI, current exam labeled similarly. Posterior pedicle screws bilaterally at L4-L5. Disc prosthesis at L4-L5. Bones appear demineralized. Vertebral body and disc space heights maintained.  IMPRESSION: Intraoperative images during posterior fusion L4-L5 as above. Electronically Signed   By: Lavonia Dana M.D.   On: 09/06/2016 10:41   Dg Lumbar Spine 1 View  Result Date: 09/06/2016 CLINICAL DATA:  Localization image for fusion. EXAM: LUMBAR SPINE - 1 VIEW COMPARISON:  Multiple priors. FINDINGS: Towel clamps are seen on the spinous processes of L4 and L5. Blunt probe is directed most closely toward the pedicle of L5. IMPRESSION: L4-5 marked. Electronically Signed   By: Staci Righter M.D.   On: 09/06/2016 10:44   Dg C-arm 1-60 Min  Result Date: 09/06/2016 CLINICAL DATA:  L4-L5 PLIF EXAM: DG C-ARM 61-120 MIN; LUMBAR SPINE - 2-3 VIEW COMPARISON:  Earlier intraoperative localization image of 09/06/2016 as well as preoperative MRI lumbar spine 08/25/2016 FLUOROSCOPY TIME:  0 minutes 13 seconds Images obtained: 2 FINDINGS: Five lumbar vertebra labeled on preoperative MRI, current exam labeled similarly. Posterior pedicle screws bilaterally at L4-L5. Disc prosthesis at L4-L5. Bones appear demineralized. Vertebral body and disc space heights maintained. IMPRESSION: Intraoperative images during posterior fusion L4-L5 as above. Electronically Signed   By: Lavonia Dana M.D.   On: 09/06/2016 10:41    Antibiotics:  Anti-infectives    Start     Dose/Rate Route Frequency Ordered Stop   09/06/16 1500  ceFAZolin (ANCEF) IVPB 1 g/50 mL premix     1 g 100 mL/hr over 30 Minutes Intravenous Every 8 hours 09/06/16 1202 09/06/16 2230   09/06/16 0700  ceFAZolin (ANCEF) IVPB 2g/100 mL premix     2 g 200 mL/hr over 30 Minutes Intravenous To [facility] 09/05/16 1452 09/06/16 0800  Discharge Exam: Blood pressure 140/61, pulse 85, temperature 98.1 F (36.7 C), temperature source Oral, resp. rate 18, height 5' 3.5" (1.613 m), weight 99.9 kg (220 lb 3.8 oz), SpO2 97 %. Neurologic: Grossly normal Dressing dry  Discharge Medications:     Medication List    TAKE these medications   acetaminophen  650 MG CR tablet Commonly known as:  TYLENOL Take 1,300 mg by mouth every 8 (eight) hours as needed for pain.   COD LIVER OIL PO Take 1 capsule by mouth daily.   HYDROcodone-acetaminophen 5-325 MG tablet Commonly known as:  NORCO/VICODIN Take 1-2 tablets by mouth every 6 (six) hours as needed for moderate pain.   levothyroxine 88 MCG tablet Commonly known as:  SYNTHROID, LEVOTHROID Take 88 mcg by mouth daily.   lisinopril-hydrochlorothiazide 20-12.5 MG tablet Commonly known as:  PRINZIDE,ZESTORETIC Take 1 tablet by mouth daily.   MEGARED OMEGA-3 KRILL OIL PO Take 1 capsule by mouth daily.   methocarbamol 500 MG tablet Commonly known as:  ROBAXIN Take 1 tablet (500 mg total) by mouth every 8 (eight) hours as needed for muscle spasms.   MULTIVITAMIN PO Take 1 tablet by mouth daily.   naproxen sodium 220 MG tablet Commonly known as:  ANAPROX Take 440 mg by mouth every 8 (eight) hours as needed (for pain).   OVER THE COUNTER MEDICATION Take 1 capsule by mouth daily. Hem-Care   pravastatin 40 MG tablet Commonly known as:  PRAVACHOL Take 40 mg by mouth daily.   PROBIOTIC FORMULA PO Take 1 capsule by mouth daily.   TART CHERRY ADVANCED PO Take 1 capsule by mouth daily.   Vitamin D3 2000 units Tabs Take 2,000 Units by mouth daily.       Disposition: home   Final Dx: PLIF  Discharge Instructions    Call MD for:  difficulty breathing, headache or visual disturbances    Complete by:  As directed    Call MD for:  persistant nausea and vomiting    Complete by:  As directed    Call MD for:  redness, tenderness, or signs of infection (pain, swelling, redness, odor or green/yellow discharge around incision site)    Complete by:  As directed    Call MD for:  severe uncontrolled pain    Complete by:  As directed    Call MD for:  temperature >100.4    Complete by:  As directed    Diet - low sodium heart healthy    Complete by:  As directed    Discharge instructions     Complete by:  As directed    No bending or twisting, no heavy lifting, may shower   Increase activity slowly    Complete by:  As directed       Follow-up [facility] .   Why:  HHPT,HHOT Contact information: [address] [place] [address] [phone]        [facility] - Dme [facility] .   Why:  rolling walker and 3 n 1, will be brought up to patient's room Contact information: [address] [place] [address] [phone]            Signed: Eustace Moore 09/10/2016, 6:29 AM

## 2016-09-10 NOTE — Progress Notes (Signed)
Physical Therapy Treatment Patient Details Name: Kathleen Terrell MRN: [id number] DOB: 12-30-1945 Today's Date: 09/10/2016    History of Present Illness Patient is a 70 y/o female with hx of HTN, HLD, DM presents s/p L4-5 PLIF.     PT Comments    Pt discharging home today. Pt to continue progress toward goals with HHPT.  Follow Up Recommendations  Home health PT;Supervision for mobility/OOB     Equipment Recommendations  Rolling walker with 5" wheels    Recommendations for Other Services       Precautions / Restrictions Precautions Precautions: Back Precaution Comments: Reviewed back precautions; pt recalled 3/3 precautions Required Braces or Orthoses: Spinal Brace Spinal Brace: Lumbar corset;Applied in sitting position    Mobility  Bed Mobility       Sidelying to sit: Min assist          Transfers   Equipment used: Rolling walker (2 wheeled)   Sit to Stand: Min assist         General transfer comment: verbal cues for hand placement  Ambulation/Gait Ambulation/Gait assistance: Min guard Ambulation Distance (Feet): 20 Feet Assistive device: Rolling walker (2 wheeled) Gait Pattern/deviations: Step-through pattern;Decreased stride length Gait velocity: decreased Gait velocity interpretation: Below normal speed for age/gender General Gait Details: cues for heel strike   Stairs            Wheelchair Mobility    Modified Rankin (Stroke Patients Only)       Balance                                    Cognition Arousal/Alertness: Awake/alert Behavior During Therapy: WFL for tasks assessed/performed Overall Cognitive Status: Within Functional Limits for tasks assessed                      Exercises      General Comments        Pertinent Vitals/Pain Pain Assessment: Faces Faces Pain Scale: Hurts little more Pain Location: sx site Pain Descriptors / Indicators: Grimacing;Guarding Pain Intervention(s): Monitored  during session;Repositioned    Home Living                      Prior Function            PT Goals (current goals can now be found in the care plan section) Acute Rehab PT Goals Patient Stated Goal: none stated PT Goal Formulation: With patient/family Time For Goal Achievement: 09/20/16 Potential to Achieve Goals: Good Progress towards PT goals: Progressing toward goals    Frequency    Min 5X/week      PT Plan Current plan remains appropriate    Co-evaluation             End of Session Equipment Utilized During Treatment: Gait belt;Back brace Activity Tolerance: Patient tolerated treatment well Patient left: in chair;with call bell/phone within reach;with family/visitor present     Time: [id number] PT Time Calculation (min) (ACUTE ONLY): 20 min  Charges:  $Therapeutic Activity: 8-22 mins                    G Codes:      Kathleen Terrell 09/10/2016, 10:18 AM

## 2016-09-10 NOTE — Progress Notes (Signed)
Patient ready for discharge to home; discharge instructions given and reviewed; patient discharged via wheelchair accompanied by her daughter.

## 2016-09-11 DIAGNOSIS — E785 Hyperlipidemia, unspecified: Secondary | ICD-10-CM | POA: Diagnosis not present

## 2016-09-11 DIAGNOSIS — E119 Type 2 diabetes mellitus without complications: Secondary | ICD-10-CM | POA: Diagnosis not present

## 2016-09-11 DIAGNOSIS — Z981 Arthrodesis status: Secondary | ICD-10-CM | POA: Diagnosis not present

## 2016-09-11 DIAGNOSIS — M4316 Spondylolisthesis, lumbar region: Secondary | ICD-10-CM | POA: Diagnosis not present

## 2016-09-11 DIAGNOSIS — Z4789 Encounter for other orthopedic aftercare: Secondary | ICD-10-CM | POA: Diagnosis not present

## 2016-09-11 DIAGNOSIS — I10 Essential (primary) hypertension: Secondary | ICD-10-CM | POA: Diagnosis not present

## 2016-09-11 DIAGNOSIS — K5792 Diverticulitis of intestine, part unspecified, without perforation or abscess without bleeding: Secondary | ICD-10-CM | POA: Diagnosis not present

## 2016-09-11 DIAGNOSIS — M5410 Radiculopathy, site unspecified: Secondary | ICD-10-CM | POA: Diagnosis not present

## 2016-09-15 DIAGNOSIS — M5410 Radiculopathy, site unspecified: Secondary | ICD-10-CM | POA: Diagnosis not present

## 2016-09-15 DIAGNOSIS — Z981 Arthrodesis status: Secondary | ICD-10-CM | POA: Diagnosis not present

## 2016-09-15 DIAGNOSIS — I10 Essential (primary) hypertension: Secondary | ICD-10-CM | POA: Diagnosis not present

## 2016-09-15 DIAGNOSIS — M4316 Spondylolisthesis, lumbar region: Secondary | ICD-10-CM | POA: Diagnosis not present

## 2016-09-15 DIAGNOSIS — E119 Type 2 diabetes mellitus without complications: Secondary | ICD-10-CM | POA: Diagnosis not present

## 2016-09-15 DIAGNOSIS — E785 Hyperlipidemia, unspecified: Secondary | ICD-10-CM | POA: Diagnosis not present

## 2016-09-15 DIAGNOSIS — K5792 Diverticulitis of intestine, part unspecified, without perforation or abscess without bleeding: Secondary | ICD-10-CM | POA: Diagnosis not present

## 2016-09-15 DIAGNOSIS — Z4789 Encounter for other orthopedic aftercare: Secondary | ICD-10-CM | POA: Diagnosis not present

## 2016-09-16 DIAGNOSIS — M4316 Spondylolisthesis, lumbar region: Secondary | ICD-10-CM | POA: Diagnosis not present

## 2016-09-16 DIAGNOSIS — K5792 Diverticulitis of intestine, part unspecified, without perforation or abscess without bleeding: Secondary | ICD-10-CM | POA: Diagnosis not present

## 2016-09-16 DIAGNOSIS — M5410 Radiculopathy, site unspecified: Secondary | ICD-10-CM | POA: Diagnosis not present

## 2016-09-16 DIAGNOSIS — E785 Hyperlipidemia, unspecified: Secondary | ICD-10-CM | POA: Diagnosis not present

## 2016-09-16 DIAGNOSIS — E119 Type 2 diabetes mellitus without complications: Secondary | ICD-10-CM | POA: Diagnosis not present

## 2016-09-16 DIAGNOSIS — Z981 Arthrodesis status: Secondary | ICD-10-CM | POA: Diagnosis not present

## 2016-09-16 DIAGNOSIS — Z4789 Encounter for other orthopedic aftercare: Secondary | ICD-10-CM | POA: Diagnosis not present

## 2016-09-16 DIAGNOSIS — I10 Essential (primary) hypertension: Secondary | ICD-10-CM | POA: Diagnosis not present

## 2016-09-19 DIAGNOSIS — E785 Hyperlipidemia, unspecified: Secondary | ICD-10-CM | POA: Diagnosis not present

## 2016-09-19 DIAGNOSIS — M5410 Radiculopathy, site unspecified: Secondary | ICD-10-CM | POA: Diagnosis not present

## 2016-09-19 DIAGNOSIS — Z4789 Encounter for other orthopedic aftercare: Secondary | ICD-10-CM | POA: Diagnosis not present

## 2016-09-19 DIAGNOSIS — Z981 Arthrodesis status: Secondary | ICD-10-CM | POA: Diagnosis not present

## 2016-09-19 DIAGNOSIS — K5792 Diverticulitis of intestine, part unspecified, without perforation or abscess without bleeding: Secondary | ICD-10-CM | POA: Diagnosis not present

## 2016-09-19 DIAGNOSIS — I10 Essential (primary) hypertension: Secondary | ICD-10-CM | POA: Diagnosis not present

## 2016-09-19 DIAGNOSIS — E119 Type 2 diabetes mellitus without complications: Secondary | ICD-10-CM | POA: Diagnosis not present

## 2016-09-19 DIAGNOSIS — M4316 Spondylolisthesis, lumbar region: Secondary | ICD-10-CM | POA: Diagnosis not present

## 2016-09-21 DIAGNOSIS — Z981 Arthrodesis status: Secondary | ICD-10-CM | POA: Diagnosis not present

## 2016-09-21 DIAGNOSIS — M5410 Radiculopathy, site unspecified: Secondary | ICD-10-CM | POA: Diagnosis not present

## 2016-09-21 DIAGNOSIS — M545 Low back pain: Secondary | ICD-10-CM | POA: Diagnosis not present

## 2016-09-21 DIAGNOSIS — I10 Essential (primary) hypertension: Secondary | ICD-10-CM | POA: Diagnosis not present

## 2016-09-21 DIAGNOSIS — E785 Hyperlipidemia, unspecified: Secondary | ICD-10-CM | POA: Diagnosis not present

## 2016-09-21 DIAGNOSIS — Z4789 Encounter for other orthopedic aftercare: Secondary | ICD-10-CM | POA: Diagnosis not present

## 2016-09-21 DIAGNOSIS — M4316 Spondylolisthesis, lumbar region: Secondary | ICD-10-CM | POA: Diagnosis not present

## 2016-09-21 DIAGNOSIS — E119 Type 2 diabetes mellitus without complications: Secondary | ICD-10-CM | POA: Diagnosis not present

## 2016-09-21 DIAGNOSIS — K5792 Diverticulitis of intestine, part unspecified, without perforation or abscess without bleeding: Secondary | ICD-10-CM | POA: Diagnosis not present

## 2016-09-23 DIAGNOSIS — Z4789 Encounter for other orthopedic aftercare: Secondary | ICD-10-CM | POA: Diagnosis not present

## 2016-09-23 DIAGNOSIS — E785 Hyperlipidemia, unspecified: Secondary | ICD-10-CM | POA: Diagnosis not present

## 2016-09-23 DIAGNOSIS — I10 Essential (primary) hypertension: Secondary | ICD-10-CM | POA: Diagnosis not present

## 2016-09-23 DIAGNOSIS — K5792 Diverticulitis of intestine, part unspecified, without perforation or abscess without bleeding: Secondary | ICD-10-CM | POA: Diagnosis not present

## 2016-09-23 DIAGNOSIS — M5410 Radiculopathy, site unspecified: Secondary | ICD-10-CM | POA: Diagnosis not present

## 2016-09-23 DIAGNOSIS — M4316 Spondylolisthesis, lumbar region: Secondary | ICD-10-CM | POA: Diagnosis not present

## 2016-09-23 DIAGNOSIS — E119 Type 2 diabetes mellitus without complications: Secondary | ICD-10-CM | POA: Diagnosis not present

## 2016-09-23 DIAGNOSIS — Z981 Arthrodesis status: Secondary | ICD-10-CM | POA: Diagnosis not present

## 2016-09-26 DIAGNOSIS — M5410 Radiculopathy, site unspecified: Secondary | ICD-10-CM | POA: Diagnosis not present

## 2016-09-26 DIAGNOSIS — E785 Hyperlipidemia, unspecified: Secondary | ICD-10-CM | POA: Diagnosis not present

## 2016-09-26 DIAGNOSIS — Z4789 Encounter for other orthopedic aftercare: Secondary | ICD-10-CM | POA: Diagnosis not present

## 2016-09-26 DIAGNOSIS — E119 Type 2 diabetes mellitus without complications: Secondary | ICD-10-CM | POA: Diagnosis not present

## 2016-09-26 DIAGNOSIS — I10 Essential (primary) hypertension: Secondary | ICD-10-CM | POA: Diagnosis not present

## 2016-09-26 DIAGNOSIS — Z981 Arthrodesis status: Secondary | ICD-10-CM | POA: Diagnosis not present

## 2016-09-26 DIAGNOSIS — M4316 Spondylolisthesis, lumbar region: Secondary | ICD-10-CM | POA: Diagnosis not present

## 2016-09-26 DIAGNOSIS — K5792 Diverticulitis of intestine, part unspecified, without perforation or abscess without bleeding: Secondary | ICD-10-CM | POA: Diagnosis not present

## 2016-09-27 DIAGNOSIS — K5792 Diverticulitis of intestine, part unspecified, without perforation or abscess without bleeding: Secondary | ICD-10-CM | POA: Diagnosis not present

## 2016-09-27 DIAGNOSIS — I10 Essential (primary) hypertension: Secondary | ICD-10-CM | POA: Diagnosis not present

## 2016-09-27 DIAGNOSIS — E785 Hyperlipidemia, unspecified: Secondary | ICD-10-CM | POA: Diagnosis not present

## 2016-09-27 DIAGNOSIS — Z981 Arthrodesis status: Secondary | ICD-10-CM | POA: Diagnosis not present

## 2016-09-27 DIAGNOSIS — E119 Type 2 diabetes mellitus without complications: Secondary | ICD-10-CM | POA: Diagnosis not present

## 2016-09-27 DIAGNOSIS — Z4789 Encounter for other orthopedic aftercare: Secondary | ICD-10-CM | POA: Diagnosis not present

## 2016-09-27 DIAGNOSIS — M5410 Radiculopathy, site unspecified: Secondary | ICD-10-CM | POA: Diagnosis not present

## 2016-09-27 DIAGNOSIS — M4316 Spondylolisthesis, lumbar region: Secondary | ICD-10-CM | POA: Diagnosis not present

## 2016-09-28 DIAGNOSIS — E119 Type 2 diabetes mellitus without complications: Secondary | ICD-10-CM | POA: Diagnosis not present

## 2016-09-28 DIAGNOSIS — K5792 Diverticulitis of intestine, part unspecified, without perforation or abscess without bleeding: Secondary | ICD-10-CM | POA: Diagnosis not present

## 2016-09-28 DIAGNOSIS — I10 Essential (primary) hypertension: Secondary | ICD-10-CM | POA: Diagnosis not present

## 2016-09-28 DIAGNOSIS — Z4789 Encounter for other orthopedic aftercare: Secondary | ICD-10-CM | POA: Diagnosis not present

## 2016-09-28 DIAGNOSIS — E785 Hyperlipidemia, unspecified: Secondary | ICD-10-CM | POA: Diagnosis not present

## 2016-09-28 DIAGNOSIS — M4316 Spondylolisthesis, lumbar region: Secondary | ICD-10-CM | POA: Diagnosis not present

## 2016-09-28 DIAGNOSIS — M5410 Radiculopathy, site unspecified: Secondary | ICD-10-CM | POA: Diagnosis not present

## 2016-09-28 DIAGNOSIS — Z981 Arthrodesis status: Secondary | ICD-10-CM | POA: Diagnosis not present

## 2016-09-29 DIAGNOSIS — Z4789 Encounter for other orthopedic aftercare: Secondary | ICD-10-CM | POA: Diagnosis not present

## 2016-09-29 DIAGNOSIS — Z981 Arthrodesis status: Secondary | ICD-10-CM | POA: Diagnosis not present

## 2016-09-29 DIAGNOSIS — K5792 Diverticulitis of intestine, part unspecified, without perforation or abscess without bleeding: Secondary | ICD-10-CM | POA: Diagnosis not present

## 2016-09-29 DIAGNOSIS — E785 Hyperlipidemia, unspecified: Secondary | ICD-10-CM | POA: Diagnosis not present

## 2016-09-29 DIAGNOSIS — I10 Essential (primary) hypertension: Secondary | ICD-10-CM | POA: Diagnosis not present

## 2016-09-29 DIAGNOSIS — M5410 Radiculopathy, site unspecified: Secondary | ICD-10-CM | POA: Diagnosis not present

## 2016-09-29 DIAGNOSIS — E119 Type 2 diabetes mellitus without complications: Secondary | ICD-10-CM | POA: Diagnosis not present

## 2016-09-29 DIAGNOSIS — M4316 Spondylolisthesis, lumbar region: Secondary | ICD-10-CM | POA: Diagnosis not present

## 2016-09-30 DIAGNOSIS — E119 Type 2 diabetes mellitus without complications: Secondary | ICD-10-CM | POA: Diagnosis not present

## 2016-09-30 DIAGNOSIS — K5792 Diverticulitis of intestine, part unspecified, without perforation or abscess without bleeding: Secondary | ICD-10-CM | POA: Diagnosis not present

## 2016-09-30 DIAGNOSIS — M5410 Radiculopathy, site unspecified: Secondary | ICD-10-CM | POA: Diagnosis not present

## 2016-09-30 DIAGNOSIS — E785 Hyperlipidemia, unspecified: Secondary | ICD-10-CM | POA: Diagnosis not present

## 2016-09-30 DIAGNOSIS — M4316 Spondylolisthesis, lumbar region: Secondary | ICD-10-CM | POA: Diagnosis not present

## 2016-09-30 DIAGNOSIS — Z981 Arthrodesis status: Secondary | ICD-10-CM | POA: Diagnosis not present

## 2016-09-30 DIAGNOSIS — I10 Essential (primary) hypertension: Secondary | ICD-10-CM | POA: Diagnosis not present

## 2016-09-30 DIAGNOSIS — Z4789 Encounter for other orthopedic aftercare: Secondary | ICD-10-CM | POA: Diagnosis not present

## 2016-10-03 DIAGNOSIS — M5410 Radiculopathy, site unspecified: Secondary | ICD-10-CM | POA: Diagnosis not present

## 2016-10-03 DIAGNOSIS — Z4789 Encounter for other orthopedic aftercare: Secondary | ICD-10-CM | POA: Diagnosis not present

## 2016-10-03 DIAGNOSIS — K5792 Diverticulitis of intestine, part unspecified, without perforation or abscess without bleeding: Secondary | ICD-10-CM | POA: Diagnosis not present

## 2016-10-03 DIAGNOSIS — M4316 Spondylolisthesis, lumbar region: Secondary | ICD-10-CM | POA: Diagnosis not present

## 2016-10-03 DIAGNOSIS — Z981 Arthrodesis status: Secondary | ICD-10-CM | POA: Diagnosis not present

## 2016-10-03 DIAGNOSIS — E785 Hyperlipidemia, unspecified: Secondary | ICD-10-CM | POA: Diagnosis not present

## 2016-10-03 DIAGNOSIS — I10 Essential (primary) hypertension: Secondary | ICD-10-CM | POA: Diagnosis not present

## 2016-10-03 DIAGNOSIS — E119 Type 2 diabetes mellitus without complications: Secondary | ICD-10-CM | POA: Diagnosis not present

## 2016-10-04 DIAGNOSIS — Z4789 Encounter for other orthopedic aftercare: Secondary | ICD-10-CM | POA: Diagnosis not present

## 2016-10-04 DIAGNOSIS — M5410 Radiculopathy, site unspecified: Secondary | ICD-10-CM | POA: Diagnosis not present

## 2016-10-04 DIAGNOSIS — K5792 Diverticulitis of intestine, part unspecified, without perforation or abscess without bleeding: Secondary | ICD-10-CM | POA: Diagnosis not present

## 2016-10-04 DIAGNOSIS — Z981 Arthrodesis status: Secondary | ICD-10-CM | POA: Diagnosis not present

## 2016-10-04 DIAGNOSIS — E119 Type 2 diabetes mellitus without complications: Secondary | ICD-10-CM | POA: Diagnosis not present

## 2016-10-04 DIAGNOSIS — E785 Hyperlipidemia, unspecified: Secondary | ICD-10-CM | POA: Diagnosis not present

## 2016-10-04 DIAGNOSIS — M4316 Spondylolisthesis, lumbar region: Secondary | ICD-10-CM | POA: Diagnosis not present

## 2016-10-04 DIAGNOSIS — I10 Essential (primary) hypertension: Secondary | ICD-10-CM | POA: Diagnosis not present

## 2016-10-05 DIAGNOSIS — M4316 Spondylolisthesis, lumbar region: Secondary | ICD-10-CM | POA: Diagnosis not present

## 2016-10-05 DIAGNOSIS — M5410 Radiculopathy, site unspecified: Secondary | ICD-10-CM | POA: Diagnosis not present

## 2016-10-05 DIAGNOSIS — E785 Hyperlipidemia, unspecified: Secondary | ICD-10-CM | POA: Diagnosis not present

## 2016-10-05 DIAGNOSIS — I10 Essential (primary) hypertension: Secondary | ICD-10-CM | POA: Diagnosis not present

## 2016-10-05 DIAGNOSIS — Z981 Arthrodesis status: Secondary | ICD-10-CM | POA: Diagnosis not present

## 2016-10-05 DIAGNOSIS — E119 Type 2 diabetes mellitus without complications: Secondary | ICD-10-CM | POA: Diagnosis not present

## 2016-10-05 DIAGNOSIS — K5792 Diverticulitis of intestine, part unspecified, without perforation or abscess without bleeding: Secondary | ICD-10-CM | POA: Diagnosis not present

## 2016-10-05 DIAGNOSIS — Z4789 Encounter for other orthopedic aftercare: Secondary | ICD-10-CM | POA: Diagnosis not present

## 2016-10-06 DIAGNOSIS — M4316 Spondylolisthesis, lumbar region: Secondary | ICD-10-CM | POA: Diagnosis not present

## 2016-10-07 DIAGNOSIS — M4316 Spondylolisthesis, lumbar region: Secondary | ICD-10-CM | POA: Diagnosis not present

## 2016-10-07 DIAGNOSIS — Z4789 Encounter for other orthopedic aftercare: Secondary | ICD-10-CM | POA: Diagnosis not present

## 2016-10-07 DIAGNOSIS — E785 Hyperlipidemia, unspecified: Secondary | ICD-10-CM | POA: Diagnosis not present

## 2016-10-07 DIAGNOSIS — E119 Type 2 diabetes mellitus without complications: Secondary | ICD-10-CM | POA: Diagnosis not present

## 2016-10-07 DIAGNOSIS — Z981 Arthrodesis status: Secondary | ICD-10-CM | POA: Diagnosis not present

## 2016-10-07 DIAGNOSIS — M5410 Radiculopathy, site unspecified: Secondary | ICD-10-CM | POA: Diagnosis not present

## 2016-10-07 DIAGNOSIS — I10 Essential (primary) hypertension: Secondary | ICD-10-CM | POA: Diagnosis not present

## 2016-10-07 DIAGNOSIS — K5792 Diverticulitis of intestine, part unspecified, without perforation or abscess without bleeding: Secondary | ICD-10-CM | POA: Diagnosis not present

## 2016-10-10 DIAGNOSIS — Z4789 Encounter for other orthopedic aftercare: Secondary | ICD-10-CM | POA: Diagnosis not present

## 2016-10-10 DIAGNOSIS — M4316 Spondylolisthesis, lumbar region: Secondary | ICD-10-CM | POA: Diagnosis not present

## 2016-10-10 DIAGNOSIS — M5410 Radiculopathy, site unspecified: Secondary | ICD-10-CM | POA: Diagnosis not present

## 2016-10-10 DIAGNOSIS — Z981 Arthrodesis status: Secondary | ICD-10-CM | POA: Diagnosis not present

## 2016-10-10 DIAGNOSIS — E785 Hyperlipidemia, unspecified: Secondary | ICD-10-CM | POA: Diagnosis not present

## 2016-10-10 DIAGNOSIS — K5792 Diverticulitis of intestine, part unspecified, without perforation or abscess without bleeding: Secondary | ICD-10-CM | POA: Diagnosis not present

## 2016-10-10 DIAGNOSIS — E119 Type 2 diabetes mellitus without complications: Secondary | ICD-10-CM | POA: Diagnosis not present

## 2016-10-10 DIAGNOSIS — I10 Essential (primary) hypertension: Secondary | ICD-10-CM | POA: Diagnosis not present

## 2016-10-12 DIAGNOSIS — E785 Hyperlipidemia, unspecified: Secondary | ICD-10-CM | POA: Diagnosis not present

## 2016-10-12 DIAGNOSIS — E119 Type 2 diabetes mellitus without complications: Secondary | ICD-10-CM | POA: Diagnosis not present

## 2016-10-12 DIAGNOSIS — K5792 Diverticulitis of intestine, part unspecified, without perforation or abscess without bleeding: Secondary | ICD-10-CM | POA: Diagnosis not present

## 2016-10-12 DIAGNOSIS — M5410 Radiculopathy, site unspecified: Secondary | ICD-10-CM | POA: Diagnosis not present

## 2016-10-12 DIAGNOSIS — Z981 Arthrodesis status: Secondary | ICD-10-CM | POA: Diagnosis not present

## 2016-10-12 DIAGNOSIS — Z4789 Encounter for other orthopedic aftercare: Secondary | ICD-10-CM | POA: Diagnosis not present

## 2016-10-12 DIAGNOSIS — M4316 Spondylolisthesis, lumbar region: Secondary | ICD-10-CM | POA: Diagnosis not present

## 2016-10-12 DIAGNOSIS — I10 Essential (primary) hypertension: Secondary | ICD-10-CM | POA: Diagnosis not present

## 2016-10-17 DIAGNOSIS — E785 Hyperlipidemia, unspecified: Secondary | ICD-10-CM | POA: Diagnosis not present

## 2016-10-17 DIAGNOSIS — Z981 Arthrodesis status: Secondary | ICD-10-CM | POA: Diagnosis not present

## 2016-10-17 DIAGNOSIS — E119 Type 2 diabetes mellitus without complications: Secondary | ICD-10-CM | POA: Diagnosis not present

## 2016-10-17 DIAGNOSIS — K5792 Diverticulitis of intestine, part unspecified, without perforation or abscess without bleeding: Secondary | ICD-10-CM | POA: Diagnosis not present

## 2016-10-17 DIAGNOSIS — M5410 Radiculopathy, site unspecified: Secondary | ICD-10-CM | POA: Diagnosis not present

## 2016-10-17 DIAGNOSIS — I10 Essential (primary) hypertension: Secondary | ICD-10-CM | POA: Diagnosis not present

## 2016-10-17 DIAGNOSIS — M4316 Spondylolisthesis, lumbar region: Secondary | ICD-10-CM | POA: Diagnosis not present

## 2016-10-17 DIAGNOSIS — Z4789 Encounter for other orthopedic aftercare: Secondary | ICD-10-CM | POA: Diagnosis not present

## 2016-10-20 DIAGNOSIS — Z4789 Encounter for other orthopedic aftercare: Secondary | ICD-10-CM | POA: Diagnosis not present

## 2016-10-20 DIAGNOSIS — M5410 Radiculopathy, site unspecified: Secondary | ICD-10-CM | POA: Diagnosis not present

## 2016-10-20 DIAGNOSIS — E785 Hyperlipidemia, unspecified: Secondary | ICD-10-CM | POA: Diagnosis not present

## 2016-10-20 DIAGNOSIS — Z981 Arthrodesis status: Secondary | ICD-10-CM | POA: Diagnosis not present

## 2016-10-20 DIAGNOSIS — E119 Type 2 diabetes mellitus without complications: Secondary | ICD-10-CM | POA: Diagnosis not present

## 2016-10-20 DIAGNOSIS — K5792 Diverticulitis of intestine, part unspecified, without perforation or abscess without bleeding: Secondary | ICD-10-CM | POA: Diagnosis not present

## 2016-10-20 DIAGNOSIS — M4316 Spondylolisthesis, lumbar region: Secondary | ICD-10-CM | POA: Diagnosis not present

## 2016-10-20 DIAGNOSIS — I10 Essential (primary) hypertension: Secondary | ICD-10-CM | POA: Diagnosis not present

## 2016-11-01 ENCOUNTER — Encounter: Payer: Self-pay | Admitting: Gastroenterology

## 2016-11-10 DIAGNOSIS — M4316 Spondylolisthesis, lumbar region: Secondary | ICD-10-CM | POA: Diagnosis not present

## 2017-01-04 DIAGNOSIS — E039 Hypothyroidism, unspecified: Secondary | ICD-10-CM | POA: Diagnosis not present

## 2017-01-04 DIAGNOSIS — E119 Type 2 diabetes mellitus without complications: Secondary | ICD-10-CM | POA: Diagnosis not present

## 2017-01-04 DIAGNOSIS — E782 Mixed hyperlipidemia: Secondary | ICD-10-CM | POA: Diagnosis not present

## 2017-01-04 DIAGNOSIS — E785 Hyperlipidemia, unspecified: Secondary | ICD-10-CM | POA: Diagnosis not present

## 2017-01-04 DIAGNOSIS — I10 Essential (primary) hypertension: Secondary | ICD-10-CM | POA: Diagnosis not present

## 2017-01-09 DIAGNOSIS — M545 Low back pain: Secondary | ICD-10-CM | POA: Diagnosis not present

## 2017-01-09 DIAGNOSIS — E119 Type 2 diabetes mellitus without complications: Secondary | ICD-10-CM | POA: Diagnosis not present

## 2017-01-09 DIAGNOSIS — E039 Hypothyroidism, unspecified: Secondary | ICD-10-CM | POA: Diagnosis not present

## 2017-01-09 DIAGNOSIS — I10 Essential (primary) hypertension: Secondary | ICD-10-CM | POA: Diagnosis not present

## 2017-01-09 DIAGNOSIS — E782 Mixed hyperlipidemia: Secondary | ICD-10-CM | POA: Diagnosis not present

## 2017-02-03 DIAGNOSIS — Z1231 Encounter for screening mammogram for malignant neoplasm of breast: Secondary | ICD-10-CM | POA: Diagnosis not present

## 2017-02-07 DIAGNOSIS — H6982 Other specified disorders of Eustachian tube, left ear: Secondary | ICD-10-CM | POA: Diagnosis not present

## 2017-02-07 DIAGNOSIS — H6122 Impacted cerumen, left ear: Secondary | ICD-10-CM | POA: Diagnosis not present

## 2017-02-07 DIAGNOSIS — H6502 Acute serous otitis media, left ear: Secondary | ICD-10-CM | POA: Diagnosis not present

## 2017-02-23 LAB — IFOBT (OCCULT BLOOD): IFOBT: POSITIVE

## 2017-02-24 ENCOUNTER — Encounter: Payer: Self-pay | Admitting: Gastroenterology

## 2017-03-13 ENCOUNTER — Ambulatory Visit: Payer: Medicare Other | Admitting: Nurse Practitioner

## 2017-03-13 DIAGNOSIS — H2512 Age-related nuclear cataract, left eye: Secondary | ICD-10-CM | POA: Diagnosis not present

## 2017-03-13 DIAGNOSIS — H25813 Combined forms of age-related cataract, bilateral: Secondary | ICD-10-CM | POA: Diagnosis not present

## 2017-03-22 ENCOUNTER — Ambulatory Visit (INDEPENDENT_AMBULATORY_CARE_PROVIDER_SITE_OTHER): Payer: Medicare Other | Admitting: Nurse Practitioner

## 2017-03-22 ENCOUNTER — Encounter: Payer: Self-pay | Admitting: Nurse Practitioner

## 2017-03-22 ENCOUNTER — Other Ambulatory Visit: Payer: Self-pay

## 2017-03-22 DIAGNOSIS — K921 Melena: Secondary | ICD-10-CM

## 2017-03-22 DIAGNOSIS — Z860100 Personal history of colon polyps, unspecified: Secondary | ICD-10-CM

## 2017-03-22 DIAGNOSIS — R195 Other fecal abnormalities: Secondary | ICD-10-CM | POA: Diagnosis not present

## 2017-03-22 HISTORY — DX: Other fecal abnormalities: R19.5

## 2017-03-22 HISTORY — DX: Personal history of colonic polyps: Z86.010

## 2017-03-22 HISTORY — DX: Personal history of colon polyps, unspecified: Z86.0100

## 2017-03-22 MED ORDER — NA SULFATE-K SULFATE-MG SULF 17.5-3.13-1.6 GM/177ML PO SOLN: 1.0000 | ORAL | 0 refills | Status: DC

## 2017-03-22 NOTE — Assessment & Plan Note (Signed)
Last colonoscopy 6 years ago with multiple polyps found to be a mix of polypoid benign colon mucosa and tubular adenoma. Recommended 10 year repeat. Given heme positive stool and history of polyps we will proceed with colonoscopy at this time as per above.

## 2017-03-22 NOTE — Assessment & Plan Note (Signed)
Heme positive stool on exam by primary care provider. Last colonoscopy approximately 6 years ago with multiple polyps removed. At this point given her history of colon polyps and heme positive stool we will proceed with colonoscopy to further evaluate. She does have a hemorrhoid which she feels is likely the source of her problem. However I explained that colonoscopy is to check for things that we may not be expecting and she understands and agrees to proceed. Return for follow-up based on postprocedure recommendations.  Proceed with colonoscopy with Dr. Oneida Alar in the near future. The risks, benefits, and alternatives have been discussed in detail with the patient. They state understanding and desire to proceed.   The patient is not on any anticoagulants, anxiolytics, chronic pain medications, or antidepressants. Conscious sedation should be adequate for her procedure.

## 2017-03-22 NOTE — Progress Notes (Signed)
Primary Care Physician:  Rory Percy, MD Primary Gastroenterologist:  Dr. Oneida Alar  Chief Complaint  Patient presents with  . Rectal Bleeding    heme+ stool, TCS consult  . Hemorrhoids    HPI:   Kathleen Terrell is a 71 y.o. female who presents On referral from primary care for heme positive stool. The patient last saw primary care 02/07/2017. Heme stool cards came back positive and was referred to [place] for further evaluation. CBC with a normal hemoglobin at 11.9. Normocytic normochromic. CMP essentially normal.  Last colonoscopy completed 12/12/2011 for diverticulitis and a previous colonoscopy greater than 5 years. Findings included moderate diverticulosis, multiple polyps in the descending and sigmoid colon, internal hemorrhoids. Surgical pathology found the polyps to be a mix of polypoid colonic mucosa and tubular adenoma. Recommended 10 year repeat colonoscopy and a high-fiber diet.  Today she states she's doing well overall. States she has a hemorrhoid on the left side which results in toilet tissue hematochezia at times when wiping. Has not seen blood in her stool, denies melena. Denies abdominal pain, N/V, fever, chills, unintentional weight loss, acute changes in bowel habits. Drinks prune juice and other OTC treatments for regular bowel movement. Has a bowel movement 1-2 times a day and typically soft and passes easily. Denies chest pain, dyspnea, dizziness, lightheadedness, syncope, near syncope. Denies any other upper or lower GI symptoms.  Past Medical History:  Diagnosis Date  . Arthritis   . Cataracts, bilateral   . DM (diabetes mellitus) ([place])    patient denies, not on medications, diet controlled  . HTN (hypertension)   . Hyperlipidemia   . Hypothyroidism   . PONV (postoperative nausea and vomiting)     Past Surgical History:  Procedure Laterality Date  . COLONOSCOPY  [place] [facility]   >5 YEARS AGO  . COLONOSCOPY  12/12/2011   Procedure: COLONOSCOPY;  Surgeon: Dorothyann Peng, MD;  Location: [facility];  Service: Endoscopy;  Laterality: N/A;  9:00  . cyst removed     under the left arm  . DILATION AND CURETTAGE OF UTERUS    . SALPINGOOPHORECTOMY     left  . TUBAL LIGATION    . tubual pregnancy      Current Outpatient Prescriptions  Medication Sig Dispense Refill  . acetaminophen (TYLENOL) 650 MG CR tablet Take 1,300 mg by mouth every 8 (eight) hours as needed for pain.     . Cholecalciferol (VITAMIN D3) 2000 units TABS Take 2,000 Units by mouth daily.    . COD LIVER OIL PO Take 1 capsule by mouth daily.    Marland Kitchen levothyroxine (SYNTHROID, LEVOTHROID) 88 MCG tablet Take 88 mcg by mouth daily.      Marland Kitchen lisinopril-hydrochlorothiazide (PRINZIDE,ZESTORETIC) 20-12.5 MG per tablet Take 1 tablet by mouth daily.      Marland Kitchen MEGARED OMEGA-3 KRILL OIL PO Take 1 capsule by mouth daily.    . methocarbamol (ROBAXIN) 500 MG tablet Take 1 tablet (500 mg total) by mouth every 8 (eight) hours as needed for muscle spasms. 30 tablet 1  . Misc Natural Products (TART CHERRY ADVANCED PO) Take 1 capsule by mouth daily.    . Multiple Vitamins-Minerals (MULTIVITAMIN PO) Take 1 tablet by mouth daily.    . naproxen sodium (ANAPROX) 220 MG tablet Take 440 mg by mouth every 8 (eight) hours as needed (for pain).     Marland Kitchen OVER THE COUNTER MEDICATION Take 1 capsule by mouth daily. Hem-Care    . OVER THE  COUNTER MEDICATION Tumeric daily    . pravastatin (PRAVACHOL) 40 MG tablet Take 40 mg by mouth daily.      . Probiotic Product (PROBIOTIC FORMULA PO) Take 1 capsule by mouth daily.       No current facility-administered medications for this visit.     Allergies as of 03/22/2017 - Review Complete 03/22/2017  Allergen Reaction Noted  . Darvocet [propoxyphene n-acetaminophen] Nausea Only and Other (See Comments) 12/12/2011  . Latex Rash 08/23/2016    Family History  Problem Relation Age of Onset  . Colon cancer Neg Hx   . Colon polyps Neg Hx     Social History   Social History  .  Marital status: Widowed    Spouse name: N/A  . Number of children: N/A  . Years of education: N/A   Occupational History  . Not on file.   Social History Main Topics  . Smoking status: Former Smoker    Packs/day: 1.50    Types: Cigarettes    Quit date: 03/09/1984  . Smokeless tobacco: Never Used  . Alcohol use No  . Drug use: No  . Sexual activity: Not on file   Other Topics Concern  . Not on file   Social History Narrative   Used to work for [employer] 71 yo    Review of Systems: Complete ROS negative except as per HPI.    Physical Exam: BP (!) 155/85   Pulse 87   Temp 98.1 F (36.7 C) (Oral)   Ht 5\' 4"  (1.626 m)   Wt 182 lb 12.8 oz (82.9 kg)   BMI 31.38 kg/m  General:   Alert and oriented. Pleasant and cooperative. Well-nourished and well-developed.  Head:  Normocephalic and atraumatic. Eyes:  Without icterus, sclera clear and conjunctiva pink.  Ears:  Normal auditory acuity. Cardiovascular:  S1, S2 present without murmurs appreciated. Extremities without clubbing or edema. Respiratory:  Clear to auscultation bilaterally. No wheezes, rales, or rhonchi. No distress.  Gastrointestinal:  +BS, soft, non-tender and non-distended. No HSM noted. No guarding or rebound. No masses appreciated.  Rectal:  Deferred  Musculoskalatal:  Symmetrical without gross deformities. Neurologic:  Alert and oriented x4;  grossly normal neurologically. Psych:  Alert and cooperative. Normal mood and affect. Heme/Lymph/Immune: No excessive bruising noted.    03/22/2017 2:55 PM   Disclaimer: This note was dictated with voice recognition software. Similar sounding words can inadvertently be transcribed and may not be corrected upon review.

## 2017-03-22 NOTE — Progress Notes (Signed)
CC'ED TO PCP 

## 2017-03-22 NOTE — Patient Instructions (Signed)
1. We will help schedule your procedure for you. 2. Further recommendations to be made based on the results of your procedure. 3. Return for follow-up based on postprocedure recommendations.

## 2017-03-30 DIAGNOSIS — H2512 Age-related nuclear cataract, left eye: Secondary | ICD-10-CM | POA: Diagnosis not present

## 2017-03-30 DIAGNOSIS — H268 Other specified cataract: Secondary | ICD-10-CM | POA: Diagnosis not present

## 2017-04-03 ENCOUNTER — Other Ambulatory Visit: Payer: Self-pay

## 2017-04-03 MED ORDER — PEG 3350-KCL-NA BICARB-NACL 420 G PO SOLR: 4000.0000 mL | ORAL | 0 refills | Status: DC

## 2017-04-10 DIAGNOSIS — M4316 Spondylolisthesis, lumbar region: Secondary | ICD-10-CM | POA: Diagnosis not present

## 2017-04-10 NOTE — Progress Notes (Signed)
REVIEWED-NO ADDITIONAL RECOMMENDATIONS. 

## 2017-04-14 ENCOUNTER — Ambulatory Visit (HOSPITAL_COMMUNITY)
Admission: RE | Admit: 2017-04-14 | Discharge: 2017-04-14 | Disposition: A | Discharge status: Home / Self Care | Payer: Medicare Other | Source: Ambulatory Visit | Attending: Gastroenterology | Admitting: Gastroenterology

## 2017-04-14 ENCOUNTER — Encounter (HOSPITAL_COMMUNITY): Payer: Self-pay | Admitting: *Deleted

## 2017-04-14 ENCOUNTER — Encounter (HOSPITAL_COMMUNITY)
Admission: RE | Disposition: A | Discharge status: Home / Self Care | Payer: Self-pay | Source: Ambulatory Visit | Attending: Gastroenterology

## 2017-04-14 DIAGNOSIS — D122 Benign neoplasm of ascending colon: Secondary | ICD-10-CM | POA: Diagnosis not present

## 2017-04-14 DIAGNOSIS — Z87891 Personal history of nicotine dependence: Secondary | ICD-10-CM | POA: Insufficient documentation

## 2017-04-14 DIAGNOSIS — D125 Benign neoplasm of sigmoid colon: Secondary | ICD-10-CM | POA: Insufficient documentation

## 2017-04-14 DIAGNOSIS — E119 Type 2 diabetes mellitus without complications: Secondary | ICD-10-CM | POA: Diagnosis not present

## 2017-04-14 DIAGNOSIS — Z79899 Other long term (current) drug therapy: Secondary | ICD-10-CM | POA: Diagnosis not present

## 2017-04-14 DIAGNOSIS — Z91040 Latex allergy status: Secondary | ICD-10-CM | POA: Diagnosis not present

## 2017-04-14 DIAGNOSIS — M1990 Unspecified osteoarthritis, unspecified site: Secondary | ICD-10-CM | POA: Diagnosis not present

## 2017-04-14 DIAGNOSIS — Z8249 Family history of ischemic heart disease and other diseases of the circulatory system: Secondary | ICD-10-CM | POA: Diagnosis not present

## 2017-04-14 DIAGNOSIS — Z888 Allergy status to other drugs, medicaments and biological substances status: Secondary | ICD-10-CM | POA: Diagnosis not present

## 2017-04-14 DIAGNOSIS — E785 Hyperlipidemia, unspecified: Secondary | ICD-10-CM | POA: Insufficient documentation

## 2017-04-14 DIAGNOSIS — Z1211 Encounter for screening for malignant neoplasm of colon: Secondary | ICD-10-CM | POA: Diagnosis present

## 2017-04-14 DIAGNOSIS — Q438 Other specified congenital malformations of intestine: Secondary | ICD-10-CM | POA: Diagnosis not present

## 2017-04-14 DIAGNOSIS — E039 Hypothyroidism, unspecified: Secondary | ICD-10-CM | POA: Diagnosis not present

## 2017-04-14 DIAGNOSIS — K5730 Diverticulosis of large intestine without perforation or abscess without bleeding: Secondary | ICD-10-CM | POA: Diagnosis not present

## 2017-04-14 DIAGNOSIS — Z86010 Personal history of colonic polyps: Secondary | ICD-10-CM

## 2017-04-14 DIAGNOSIS — D123 Benign neoplasm of transverse colon: Secondary | ICD-10-CM

## 2017-04-14 DIAGNOSIS — D126 Benign neoplasm of colon, unspecified: Secondary | ICD-10-CM

## 2017-04-14 DIAGNOSIS — K648 Other hemorrhoids: Secondary | ICD-10-CM | POA: Insufficient documentation

## 2017-04-14 DIAGNOSIS — D127 Benign neoplasm of rectosigmoid junction: Secondary | ICD-10-CM | POA: Diagnosis not present

## 2017-04-14 DIAGNOSIS — K644 Residual hemorrhoidal skin tags: Secondary | ICD-10-CM | POA: Insufficient documentation

## 2017-04-14 DIAGNOSIS — K921 Melena: Secondary | ICD-10-CM

## 2017-04-14 DIAGNOSIS — I10 Essential (primary) hypertension: Secondary | ICD-10-CM | POA: Diagnosis not present

## 2017-04-14 HISTORY — PX: POLYPECTOMY: SHX5525

## 2017-04-14 HISTORY — PX: COLONOSCOPY: SHX5424

## 2017-04-14 LAB — GLUCOSE, CAPILLARY: GLUCOSE-CAPILLARY: 78 mg/dL (ref 65–99)

## 2017-04-14 SURGERY — COLONOSCOPY
Anesthesia: Moderate Sedation

## 2017-04-14 MED ORDER — MEPERIDINE HCL 100 MG/ML IJ SOLN
INTRAMUSCULAR | Status: AC
  Filled 2017-04-14: qty 2

## 2017-04-14 MED ORDER — MEPERIDINE HCL 100 MG/ML IJ SOLN
INTRAMUSCULAR | Status: DC | PRN
  Administered 2017-04-14 (×3): 25 mg via INTRAVENOUS

## 2017-04-14 MED ORDER — ONDANSETRON HCL 4 MG/2ML IJ SOLN
INTRAMUSCULAR | Status: DC | PRN
  Administered 2017-04-14: 4 mg via INTRAVENOUS

## 2017-04-14 MED ORDER — HYDROCORTISONE 2.5 % RE CREA: 1.0000 "application " | TOPICAL_CREAM | Freq: Four times a day (QID) | RECTAL | 0 refills | Status: DC

## 2017-04-14 MED ORDER — MIDAZOLAM HCL 5 MG/5ML IJ SOLN
INTRAMUSCULAR | Status: DC | PRN
  Administered 2017-04-14: 2 mg via INTRAVENOUS
  Administered 2017-04-14: 1 mg via INTRAVENOUS
  Administered 2017-04-14: 2 mg via INTRAVENOUS

## 2017-04-14 MED ORDER — SODIUM CHLORIDE 0.9 % IV SOLN: INTRAVENOUS | Status: DC

## 2017-04-14 MED ORDER — ONDANSETRON HCL 4 MG/2ML IJ SOLN
INTRAMUSCULAR | Status: AC
  Filled 2017-04-14: qty 2

## 2017-04-14 MED ORDER — MIDAZOLAM HCL 5 MG/5ML IJ SOLN
INTRAMUSCULAR | Status: AC
  Filled 2017-04-14: qty 10

## 2017-04-14 NOTE — Op Note (Addendum)
[facility] Patient Name: Kathleen Terrell Procedure Date: 04/14/2017 10:07 AM MRN: [id number] Date of Birth: 12-Jan-1946 Attending MD: Barney Drain , MD CSN: [id number] Age: 71 Admit Type: Outpatient Procedure:                Colonoscopy WITH COLD FORCEPS AND SNARE CAUTERY                            POLYPECTOMY Indications:              High risk colon cancer surveillance: Personal                            history of colonic polyps Providers:                Barney Drain, MD, Janeece Riggers, RN, Lurline Del, RN Referring MD:             Rory Percy, MD Medicines:                Ondansetron 4 mg IV, Meperidine 75 mg IV, Midazolam                            5 mg IV Complications:            No immediate complications. Estimated Blood Loss:     Estimated blood loss was minimal. Procedure:                Pre-Anesthesia Assessment:                           - Prior to the procedure, a History and Physical                            was performed, and patient medications and                            allergies were reviewed. The patient's tolerance of                            previous anesthesia was also reviewed. The risks                            and benefits of the procedure and the sedation                            options and risks were discussed with the patient.                            All questions were answered, and informed consent                            was obtained. Prior Anticoagulants: The patient has                            taken no previous anticoagulant or antiplatelet  agents. ASA Grade Assessment: II - A patient with                            mild systemic disease. After reviewing the risks                            and benefits, the patient was deemed in                            satisfactory condition to undergo the procedure.                            After obtaining informed consent, the colonoscope       was passed under direct vision. Throughout the                            procedure, the patient's blood pressure, pulse, and                            oxygen saturations were monitored continuously. The                            EC-[id number] ([id number]) scope was introduced through                            the anus and advanced to the the cecum, identified                            by appendiceal orifice and ileocecal valve. The                            colonoscopy was technically difficult and complex                            due to restricted mobility of the colon and                            significant looping. Successful completion of the                            procedure was aided by increasing the dose of                            sedation medication, changing the patient to a                            supine position, withdrawing the scope and                            replacing with the adult endoscope, straightening                            and shortening the scope to obtain bowel loop  reduction and COLOWRAP. The patient tolerated the                            procedure fairly well. The quality of the bowel                            preparation was excellent. The ileocecal valve,                            appendiceal orifice, and rectum were photographed. Scope In: 10:38:16 AM Scope Out: 10:42:03 AM Total Procedure Duration: 0 hours 3 minutes 47 seconds  Findings:      Three sessile polyps were found in the proximal transverse colon,       hepatic flexure and mid ascending colon. The polyps were 4 to 6 mm in       size. These polyps were removed with a hot snare. Resection and       retrieval were complete.      Three sessile polyps were found in the recto-sigmoid colon(1) and       sigmoid colon. The polyps were 2 to 4 mm in size. These polyps were       removed with a cold biopsy forceps. Resection and retrieval were        complete.      Multiple small and large-mouthed diverticula were found in the       recto-sigmoid colon, sigmoid colon, descending colon and transverse       colon.      The recto-sigmoid colon and sigmoid colon were moderately redundant.      External and internal hemorrhoids were found during retroflexion. Impression:               - Three 4 to 6 mm polyps in the proximal transverse                            colon, at the hepatic flexure and in the mid                            ascending colon, removed with a hot snare. Resected                            and retrieved.                           - Three 2 to 4 mm polyps at the recto-sigmoid colon                            and in the sigmoid colon, removed with a cold                            biopsy forceps. Resected and retrieved.                           - Diverticulosis in the recto-sigmoid colon, in the  sigmoid colon, in the descending colon and in the                            transverse colon.                           - Redundant colon.                           - External and internal hemorrhoids. Moderate Sedation:      Moderate (conscious) sedation was administered by the endoscopy nurse       and supervised by the endoscopist. The following parameters were       monitored: oxygen saturation, heart rate, blood pressure, and response       to care. Total physician intraservice time was 49 minutes. Recommendation:           - Repeat colonoscopy in 3 - 5 years for                            surveillance.                           - High fiber diet.                           - Continue present medications.                           - Await pathology results.                           - PT HAS HAD AGE APPROPRIATE COLON CANCER                            SCREENING. THERE IS NO NEED TO HEME TEST STOOL.                           - Patient has a contact number available for                             emergencies. The signs and symptoms of potential                            delayed complications were discussed with the                            patient. Return to normal activities tomorrow.                            Written discharge instructions were provided to the                            patient. Procedure Code(s):        --- Professional ---  [phone], Colonoscopy, flexible; with removal of                            tumor(s), polyp(s), or other lesion(s) by snare                            technique                           45380, 59, Colonoscopy, flexible; with biopsy,                            single or multiple                           99152, Moderate sedation services provided by the                            same physician or other qualified health care                            professional performing the diagnostic or                            therapeutic service that the sedation supports,                            requiring the presence of an independent trained                            observer to assist in the monitoring of the                            patient's level of consciousness and physiological                            status; initial 15 minutes of intraservice time,                            patient age 7 years or older                           [phone], Moderate sedation services; each additional                            15 minutes intraservice time                           99153, Moderate sedation services; each additional                            15 minutes intraservice time Diagnosis Code(s):        --- Professional ---                           Z86.010, Personal history of colonic  polyps                           D12.3, Benign neoplasm of transverse colon (hepatic                            flexure or splenic flexure)                           D12.2, Benign neoplasm of ascending colon                            D12.7, Benign neoplasm of rectosigmoid junction                           D12.5, Benign neoplasm of sigmoid colon                           K64.8, Other hemorrhoids                           K57.30, Diverticulosis of large intestine without                            perforation or abscess without bleeding                           Q43.8, Other specified congenital malformations of                            intestine CPT copyright 2016 American Medical [employer]. All rights reserved. The codes documented in this report are preliminary and upon coder review may  be revised to meet current compliance requirements. Barney Drain, MD Barney Drain, MD 04/14/2017 11:27:02 AM This report has been signed electronically. Number of Addenda: 0

## 2017-04-14 NOTE — H&P (Signed)
Primary Care Physician:  Rory Percy, MD Primary Gastroenterologist:  Dr. Oneida Alar  Pre-Procedure History & Physical: HPI:  Kathleen Terrell is a 71 y.o. female here for PERSONAL HISTORY OF POLYPS.  Past Medical History:  Diagnosis Date  . Arthritis   . Cataracts, bilateral   . DM (diabetes mellitus) ([place])    patient denies, not on medications, diet controlled  . HTN (hypertension)   . Hyperlipidemia   . Hypothyroidism   . PONV (postoperative nausea and vomiting)     Past Surgical History:  Procedure Laterality Date  . BACK SURGERY  09/05/2016   L4 and L5   . COLONOSCOPY  [facility]   >5 YEARS AGO  . COLONOSCOPY  12/12/2011   Procedure: COLONOSCOPY;  Surgeon: Dorothyann Peng, MD;  Location: [facility];  Service: Endoscopy;  Laterality: N/A;  9:00  . cyst removed     under the left arm  . DILATION AND CURETTAGE OF UTERUS    . SALPINGOOPHORECTOMY     left  . TUBAL LIGATION    . tubual pregnancy      Prior to Admission medications   Medication Sig Start Date End Date Taking? Authorizing Provider  Cholecalciferol (VITAMIN D3) 2000 units TABS Take 2,000 Units by mouth daily.   Yes Historical Provider, MD  COD LIVER OIL PO Take 1 capsule by mouth daily.   Yes Historical Provider, MD  levothyroxine (SYNTHROID, LEVOTHROID) 88 MCG tablet Take 88 mcg by mouth daily before breakfast.    Yes Historical Provider, MD  lisinopril-hydrochlorothiazide (PRINZIDE,ZESTORETIC) 20-12.5 MG per tablet Take 1 tablet by mouth daily.     Yes Historical Provider, MD  MEGARED OMEGA-3 KRILL OIL PO Take 1 capsule by mouth daily.   Yes Historical Provider, MD  Misc Natural Products (TART CHERRY ADVANCED PO) Take 1 capsule by mouth daily.   Yes Historical Provider, MD  Multiple Vitamins-Minerals (MULTIVITAMIN PO) Take 1 tablet by mouth daily.   Yes Historical Provider, MD  naproxen sodium (ANAPROX) 220 MG tablet Take 440 mg by mouth every 8 (eight) hours as needed (for pain).    Yes Historical Provider, MD   OVER THE COUNTER MEDICATION Take 1 capsule by mouth daily. Hem-Care   Yes Historical Provider, MD  pravastatin (PRAVACHOL) 40 MG tablet Take 40 mg by mouth daily.     Yes Historical Provider, MD  Probiotic Product (PROBIOTIC FORMULA PO) Take 1 capsule by mouth daily.     Yes Historical Provider, MD  TURMERIC CURCUMIN PO Take 1 capsule by mouth daily.   Yes Historical Provider, MD  acetaminophen (TYLENOL) 650 MG CR tablet Take 1,300 mg by mouth every 8 (eight) hours as needed for pain.     Historical Provider, MD  methocarbamol (ROBAXIN) 500 MG tablet Take 1 tablet (500 mg total) by mouth every 8 (eight) hours as needed for muscle spasms. 09/10/16   Eustace Moore, MD    Allergies as of 03/22/2017 - Review Complete 03/22/2017  Allergen Reaction Noted  . Darvocet [propoxyphene n-acetaminophen] Nausea Only and Other (See Comments) 12/12/2011  . Latex Rash 08/23/2016    Family History  Problem Relation Age of Onset  . Heart attack Mother   . Hypertension Mother   . Thyroid disease Mother   . Kidney disease Brother   . Stroke Brother   . Heart attack Brother   . Colon cancer Neg Hx   . Colon polyps Neg Hx     Social History   Social History  .  Marital status: Widowed    Spouse name: N/A  . Number of children: N/A  . Years of education: N/A   Occupational History  . Not on file.   Social History Main Topics  . Smoking status: Former Smoker    Packs/day: 1.50    Types: Cigarettes    Quit date: 03/09/1984  . Smokeless tobacco: Never Used  . Alcohol use No  . Drug use: No  . Sexual activity: Not on file   Other Topics Concern  . Not on file   Social History Narrative   Used to work for [employer] 71 yo    Review of Systems: See HPI, otherwise negative ROS   Physical Exam: BP (!) 162/80   Pulse 78   Temp 99.3 F (37.4 C) (Oral)   Resp 14   Ht 5' 3.5" (1.613 m)   Wt 180 lb (81.6 kg)   SpO2 99%   BMI 31.39 kg/m  General:   Alert,   pleasant and cooperative in NAD Head:  Normocephalic and atraumatic. Neck:  Supple; Lungs:  Clear throughout to auscultation.    Heart:  Regular rate and rhythm. Abdomen:  Soft, nontender and nondistended. Normal bowel sounds, without guarding, and without rebound.   Neurologic:  Alert and  oriented x4;  grossly normal neurologically.  Impression/Plan:    PERSONAL HISTORY OF POLYPS.  PLAN:  1. TCS TODAY. DISCUSSED PROCEDURE, BENEFITS, & RISKS: < 1% chance of medication reaction, bleeding, perforation, or rupture of spleen/liver.

## 2017-04-14 NOTE — Discharge Instructions (Signed)
You have INTERNAL AND EXTERNAL hemorrhoids. YOU HAVE diverticulosis IN YOUR RIGHT AND LEFT COLON. YOU HAD SIX SMALL POLYPS REMOVED.    DRINK WATER TO KEEP YOUR URINE LIGHT YELLOW.  FOLLOW A HIGH FIBER DIET. AVOID ITEMS THAT CAUSE BLOATING. See info below.  CONTINUE YOUR WEIGHT LOSS EFFORTS. LOSE TEN POUNDS.  USE PREPARATION H FOUR TIMES  A DAY IF NEEDED TO RELIEVE RECTAL PAIN/PRESSURE/BLEEDING. USE PROCTOZONE FOUR TIMES A DAY FOR 10 DAYS WHEN YOU HAVE RECTAL PAIN OR BLEEDING.   YOUR BIOPSY RESULTS WILL BE AVAILABLE IN MY CHART  APR 24 AND MY OFFICE WILL CONTACT YOU IN 10-14 DAYS WITH YOUR RESULTS.   Next colonoscopy in 3-5 years.  Colonoscopy Care After Read the instructions outlined below and refer to this sheet in the next week. These discharge instructions provide you with general information on caring for yourself after you leave the hospital. While your treatment has been planned according to the most current medical practices available, unavoidable complications occasionally occur. If you have any problems or questions after discharge, call DR. Alphonsus Doyel, [phone].  ACTIVITY  You may resume your regular activity, but move at a slower pace for the next 24 hours.   Take frequent rest periods for the next 24 hours.   Walking will help get rid of the air and reduce the bloated feeling in your belly (abdomen).   No driving for 24 hours (because of the medicine (anesthesia) used during the test).   You may shower.   Do not sign any important legal documents or operate any machinery for 24 hours (because of the anesthesia used during the test).    NUTRITION  Drink plenty of fluids.   You may resume your normal diet as instructed by your doctor.   Begin with a light meal and progress to your normal diet. Heavy or fried foods are harder to digest and may make you feel sick to your stomach (nauseated).   Avoid alcoholic beverages for 24 hours or as instructed.     MEDICATIONS  You may resume your normal medications.   WHAT YOU CAN EXPECT TODAY  Some feelings of bloating in the abdomen.   Passage of more gas than usual.   Spotting of blood in your stool or on the toilet paper  .  IF YOU HAD POLYPS REMOVED DURING THE COLONOSCOPY:  Eat a soft diet IF YOU HAVE NAUSEA, BLOATING, ABDOMINAL PAIN, OR VOMITING.    FINDING OUT THE RESULTS OF YOUR TEST Not all test results are available during your visit. DR. Oneida Alar WILL CALL YOU WITHIN 14 DAYS OF YOUR PROCEDUE WITH YOUR RESULTS. Do not assume everything is normal if you have not heard from DR. Inell Mimbs, CALL HER OFFICE AT [phone].  SEEK IMMEDIATE MEDICAL ATTENTION AND CALL THE OFFICE: [phone] IF:  You have more than a spotting of blood in your stool.   Your belly is swollen (abdominal distention).   You are nauseated or vomiting.   You have a temperature over 101F.   You have abdominal pain or discomfort that is severe or gets worse throughout the day.  High-Fiber Diet A high-fiber diet changes your normal diet to include more whole grains, legumes, fruits, and vegetables. Changes in the diet involve replacing refined carbohydrates with unrefined foods. The calorie level of the diet is essentially unchanged. The Dietary Reference Intake (recommended amount) for adult males is 38 grams per day. For adult females, it is 25 grams per day. Pregnant and lactating women should consume  28 grams of fiber per day. Fiber is the intact part of a plant that is not broken down during digestion. Functional fiber is fiber that has been isolated from the plant to provide a beneficial effect in the body. PURPOSE  Increase stool bulk.   Ease and regulate bowel movements.   Lower cholesterol.  REDUCE RISK OF COLON CANCER  INDICATIONS THAT YOU NEED MORE FIBER  Constipation and hemorrhoids.   Uncomplicated diverticulosis (intestine condition) and irritable bowel syndrome.   Weight  management.   As a protective measure against hardening of the arteries (atherosclerosis), diabetes, and cancer.   GUIDELINES FOR INCREASING FIBER IN THE DIET  Start adding fiber to the diet slowly. A gradual increase of about 5 more grams (2 slices of whole-wheat bread, 2 servings of most fruits or vegetables, or 1 bowl of high-fiber cereal) per day is best. Too rapid an increase in fiber may result in constipation, flatulence, and bloating.   Drink enough water and fluids to keep your urine clear or pale yellow. Water, juice, or caffeine-free drinks are recommended. Not drinking enough fluid may cause constipation.   Eat a variety of high-fiber foods rather than one type of fiber.   Try to increase your intake of fiber through using high-fiber foods rather than fiber pills or supplements that contain small amounts of fiber.   The goal is to change the types of food eaten. Do not supplement your present diet with high-fiber foods, but replace foods in your present diet.   INCLUDE A VARIETY OF FIBER SOURCES  Replace refined and processed grains with whole grains, canned fruits with fresh fruits, and incorporate other fiber sources. White rice, white breads, and most bakery goods contain little or no fiber.   Brown whole-grain rice, buckwheat oats, and many fruits and vegetables are all good sources of fiber. These include: broccoli, Brussels sprouts, cabbage, cauliflower, beets, sweet potatoes, white potatoes (skin on), carrots, tomatoes, eggplant, squash, berries, fresh fruits, and dried fruits.   Cereals appear to be the richest source of fiber. Cereal fiber is found in whole grains and bran. Bran is the fiber-rich outer coat of cereal grain, which is largely removed in refining. In whole-grain cereals, the bran remains. In breakfast cereals, the largest amount of fiber is found in those with "bran" in their names. The fiber content is sometimes indicated on the label.   You may need to  include additional fruits and vegetables each day.   In baking, for 1 cup white flour, you may use the following substitutions:   1 cup whole-wheat flour minus 2 tablespoons.   1/2 cup white flour plus 1/2 cup whole-wheat flour.   Polyps, Colon  A polyp is extra tissue that grows inside your body. Colon polyps grow in the large intestine. The large intestine, also called the colon, is part of your digestive system. It is a long, hollow tube at the end of your digestive tract where your body makes and stores stool. Most polyps are not dangerous. They are benign. This means they are not cancerous. But over time, some types of polyps can turn into cancer. Polyps that are smaller than a pea are usually not harmful. But larger polyps could someday become or may already be cancerous. To be safe, doctors remove all polyps and test them.   PREVENTION There is not one sure way to prevent polyps. You might be able to lower your risk of getting them if you:  Eat more fruits and vegetables  and less fatty food.   Do not smoke.   Avoid alcohol.   Exercise every day.   Lose weight if you are overweight.   Eating more calcium and folate can also lower your risk of getting polyps. Some foods that are rich in calcium are milk, cheese, and broccoli. Some foods that are rich in folate are chickpeas, kidney beans, and spinach.    Diverticulosis Diverticulosis is a common condition that develops when small pouches (diverticula) form in the wall of the colon. The risk of diverticulosis increases with age. It happens more often in people who eat a low-fiber diet. Most individuals with diverticulosis have no symptoms. Those individuals with symptoms usually experience belly (abdominal) pain, constipation, or loose stools (diarrhea).  HOME CARE INSTRUCTIONS  Increase the amount of fiber in your diet as directed by your caregiver or dietician. This may reduce symptoms of diverticulosis.   Drink at least 6 to  8 glasses of water each day to prevent constipation.   Try not to strain when you have a bowel movement.   Avoiding nuts and seeds to prevent complications is NOT NECESSARY.     FOODS HAVING HIGH FIBER CONTENT INCLUDE:  Fruits. Apple, peach, pear, tangerine, raisins, prunes.   Vegetables. Brussels sprouts, asparagus, broccoli, cabbage, carrot, cauliflower, romaine lettuce, spinach, summer squash, tomato, winter squash, zucchini.   Starchy Vegetables. Baked beans, kidney beans, lima beans, split peas, lentils, potatoes (with skin).   Grains. Whole wheat bread, brown rice, bran flake cereal, plain oatmeal, white rice, shredded wheat, bran muffins.    SEEK IMMEDIATE MEDICAL CARE IF:  You develop increasing pain or severe bloating.   You have an oral temperature above 101F.   You develop vomiting or bowel movements that are bloody or black.   Hemorrhoids Hemorrhoids are dilated (enlarged) veins around the rectum. Sometimes clots will form in the veins. This makes them swollen and painful. These are called thrombosed hemorrhoids. Causes of hemorrhoids include:  Constipation.   Straining to have a bowel movement.   HEAVY LIFTING  HOME CARE INSTRUCTIONS  Eat a well balanced diet and drink 6 to 8 glasses of water every day to avoid constipation. You may also use a bulk laxative.   Avoid straining to have bowel movements.   Keep anal area dry and clean.   Do not use a donut shaped pillow or sit on the toilet for long periods. This increases blood pooling and pain.   Move your bowels when your body has the urge; this will require less straining and will decrease pain and pressure.

## 2017-04-17 ENCOUNTER — Telehealth: Payer: Self-pay | Admitting: Gastroenterology

## 2017-04-17 DIAGNOSIS — H2511 Age-related nuclear cataract, right eye: Secondary | ICD-10-CM | POA: Diagnosis not present

## 2017-04-17 NOTE — Telephone Encounter (Signed)
Please call pt. She had six simple adenomas removed from her colon.   DRINK WATER TO KEEP YOUR URINE LIGHT YELLOW.  FOLLOW A HIGH FIBER DIET. AVOID ITEMS THAT CAUSE BLOATING.   CONTINUE YOUR WEIGHT LOSS EFFORTS. LOSE TEN POUNDS.  USE PREPARATION H FOUR TIMES  A DAY IF NEEDED TO RELIEVE RECTAL PAIN/PRESSURE/BLEEDING.   Next colonoscopy in 3 years.

## 2017-04-17 NOTE — Telephone Encounter (Signed)
Reminder in epic °

## 2017-04-17 NOTE — Telephone Encounter (Signed)
Pt is aware of results and plan. Said she called PCP this morning and they haven't returned her call. She has been having abdominal cramps for the last several days and more reflux, burping and food coming back up. I told her that I would let Dr. Oneida Alar know.

## 2017-04-17 NOTE — Telephone Encounter (Signed)
Tried both phone numbers and no answer.

## 2017-04-17 NOTE — Telephone Encounter (Signed)
PLEASE CALL PT. IF SHE HAS UPPER ABDOMINAL PAIN,BURPING, AND REFLUX, SHE SHOULD GET OMEPRAZOLE OVER THE COUNTER AND TAKE IT TWICE DAILY FOR 2 WEEKS. WE CAN GET A FOLLOW UP APPT IN 3-4 WEEKS, Dx: DYSPEPSIA. SHE SHOULD HOLD KRILL OIL, NAPROXEN, AND COD LIVER OIL. SHE SOUL CHANGE TO A FULL LIQUID DIET FOR 3 DAYS.

## 2017-04-18 ENCOUNTER — Encounter: Payer: Self-pay | Admitting: Nurse Practitioner

## 2017-04-18 NOTE — Telephone Encounter (Signed)
PT called and is aware. Forwarding to [place] to make appt.

## 2017-04-18 NOTE — Telephone Encounter (Signed)
APPOINTMENT MADE AND LETTER SENT °

## 2017-04-19 ENCOUNTER — Encounter (HOSPITAL_COMMUNITY): Payer: Self-pay | Admitting: Gastroenterology

## 2017-04-20 DIAGNOSIS — H25811 Combined forms of age-related cataract, right eye: Secondary | ICD-10-CM | POA: Diagnosis not present

## 2017-04-20 DIAGNOSIS — H2511 Age-related nuclear cataract, right eye: Secondary | ICD-10-CM | POA: Diagnosis not present

## 2017-04-20 DIAGNOSIS — H5703 Miosis: Secondary | ICD-10-CM | POA: Diagnosis not present

## 2017-04-25 DIAGNOSIS — H5315 Visual distortions of shape and size: Secondary | ICD-10-CM | POA: Diagnosis not present

## 2017-05-09 ENCOUNTER — Ambulatory Visit: Payer: Medicare Other | Admitting: Nurse Practitioner

## 2017-07-25 DIAGNOSIS — Z961 Presence of intraocular lens: Secondary | ICD-10-CM | POA: Diagnosis not present

## 2017-08-03 DIAGNOSIS — I10 Essential (primary) hypertension: Secondary | ICD-10-CM | POA: Diagnosis not present

## 2017-08-03 DIAGNOSIS — E785 Hyperlipidemia, unspecified: Secondary | ICD-10-CM | POA: Diagnosis not present

## 2017-08-03 DIAGNOSIS — Z0000 Encounter for general adult medical examination without abnormal findings: Secondary | ICD-10-CM | POA: Diagnosis not present

## 2017-08-03 DIAGNOSIS — E119 Type 2 diabetes mellitus without complications: Secondary | ICD-10-CM | POA: Diagnosis not present

## 2017-08-03 DIAGNOSIS — E039 Hypothyroidism, unspecified: Secondary | ICD-10-CM | POA: Diagnosis not present

## 2017-08-07 DIAGNOSIS — I10 Essential (primary) hypertension: Secondary | ICD-10-CM | POA: Diagnosis not present

## 2017-08-07 DIAGNOSIS — Z0000 Encounter for general adult medical examination without abnormal findings: Secondary | ICD-10-CM | POA: Diagnosis not present

## 2017-08-07 DIAGNOSIS — Z23 Encounter for immunization: Secondary | ICD-10-CM | POA: Diagnosis not present

## 2018-01-30 DIAGNOSIS — Z961 Presence of intraocular lens: Secondary | ICD-10-CM | POA: Diagnosis not present

## 2018-01-30 DIAGNOSIS — H35033 Hypertensive retinopathy, bilateral: Secondary | ICD-10-CM | POA: Diagnosis not present

## 2018-02-26 DIAGNOSIS — E039 Hypothyroidism, unspecified: Secondary | ICD-10-CM | POA: Diagnosis not present

## 2018-02-26 DIAGNOSIS — I10 Essential (primary) hypertension: Secondary | ICD-10-CM | POA: Diagnosis not present

## 2018-02-26 DIAGNOSIS — E785 Hyperlipidemia, unspecified: Secondary | ICD-10-CM | POA: Diagnosis not present

## 2018-02-26 DIAGNOSIS — E782 Mixed hyperlipidemia: Secondary | ICD-10-CM | POA: Diagnosis not present

## 2018-02-26 DIAGNOSIS — E119 Type 2 diabetes mellitus without complications: Secondary | ICD-10-CM | POA: Diagnosis not present

## 2018-02-28 DIAGNOSIS — I10 Essential (primary) hypertension: Secondary | ICD-10-CM | POA: Diagnosis not present

## 2018-02-28 DIAGNOSIS — E0865 Diabetes mellitus due to underlying condition with hyperglycemia: Secondary | ICD-10-CM | POA: Diagnosis not present

## 2018-02-28 DIAGNOSIS — E782 Mixed hyperlipidemia: Secondary | ICD-10-CM | POA: Diagnosis not present

## 2018-02-28 DIAGNOSIS — E039 Hypothyroidism, unspecified: Secondary | ICD-10-CM | POA: Diagnosis not present

## 2018-02-28 DIAGNOSIS — E785 Hyperlipidemia, unspecified: Secondary | ICD-10-CM | POA: Diagnosis not present

## 2018-03-05 DIAGNOSIS — Z1231 Encounter for screening mammogram for malignant neoplasm of breast: Secondary | ICD-10-CM | POA: Diagnosis not present

## 2018-03-08 IMAGING — RF DG C-ARM 61-120 MIN
1 series · 2 of 2 positions shown · non-contrast
Comparison: Earlier intraoperative localization image of 09/06/2016
as well as preoperative MRI lumbar spine 08/25/2016

FLUOROSCOPY TIME:  0 minutes 13 seconds

Images obtained: 2

CLINICAL DATA: L4-L5 PLIF

EXAM:
DG C-ARM 61-120 MIN; LUMBAR SPINE - 2-3 VIEW

[Series 1: run · 2 of 2 slices shown]
[im 1/2]
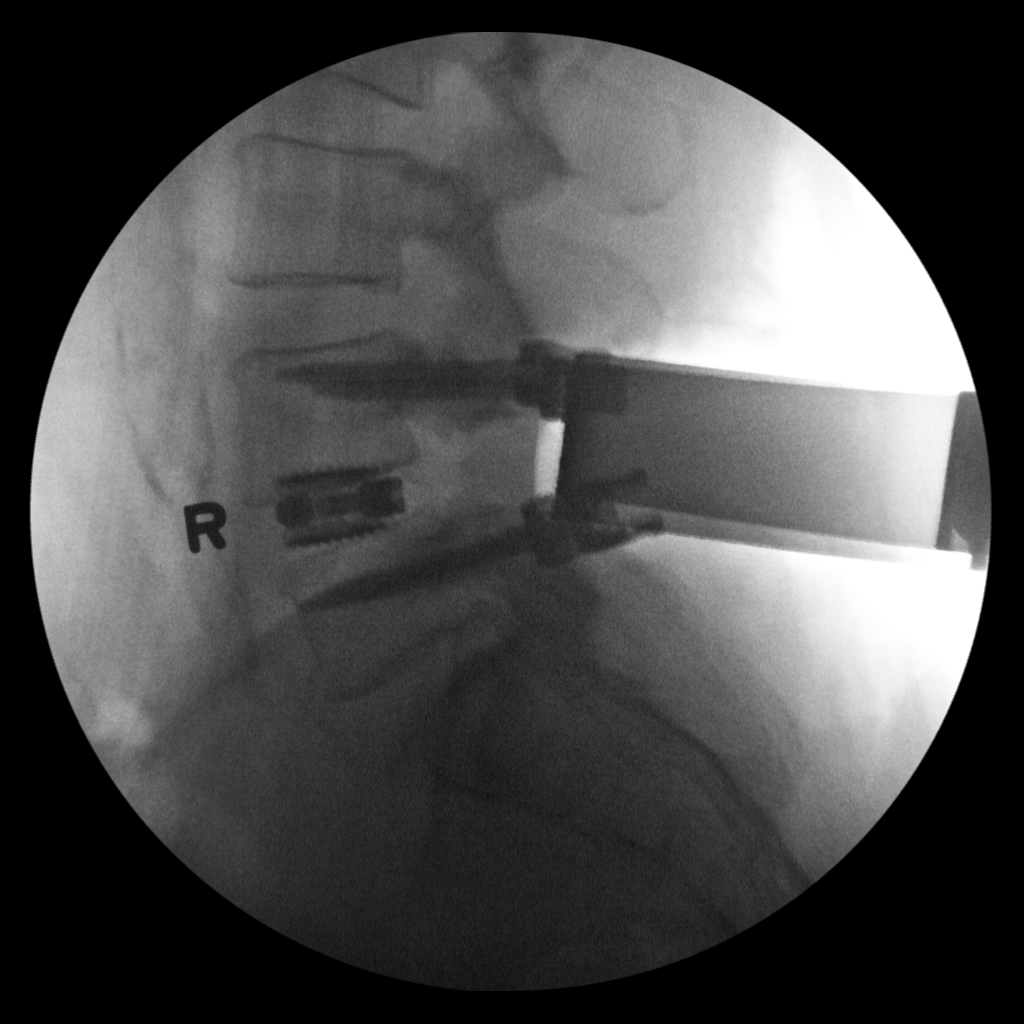
[im 2/2]
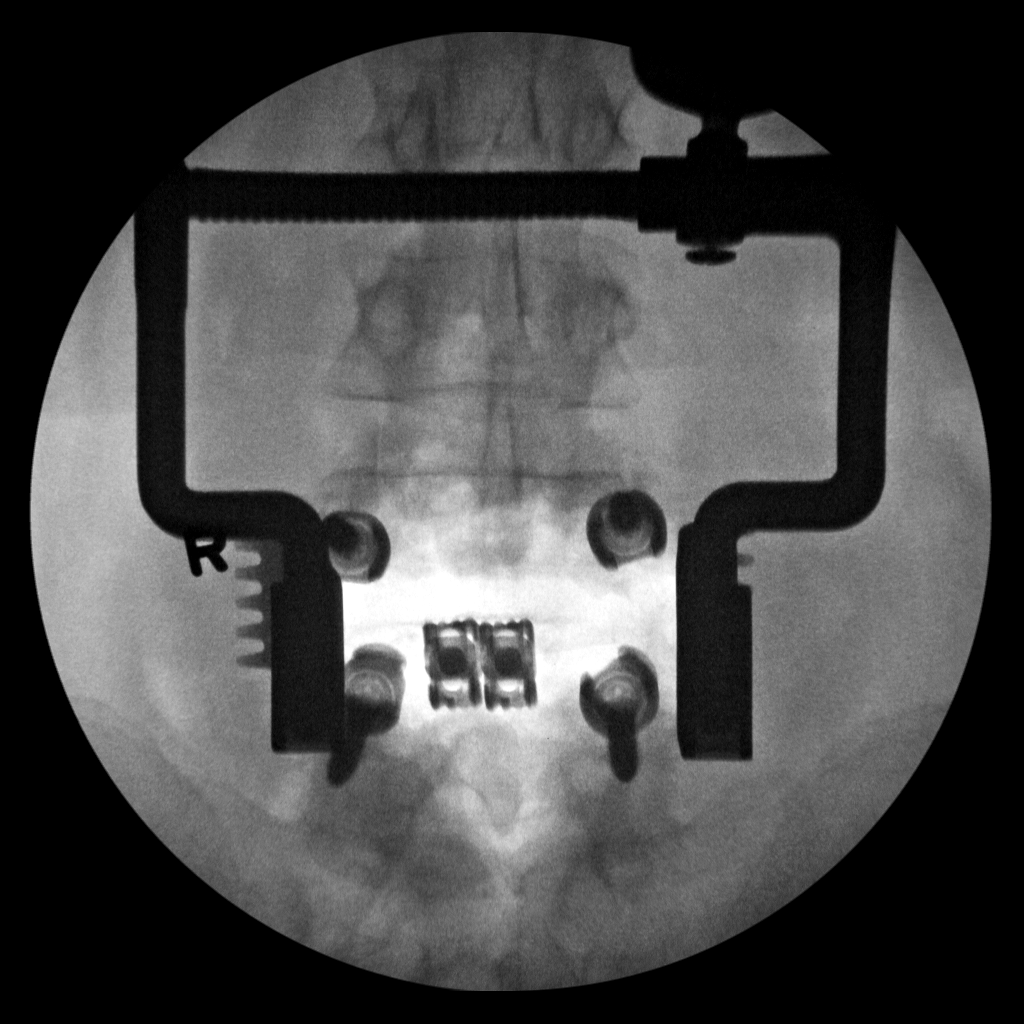

[2 of 2 positions shown; findings below may reference images not displayed]

FINDINGS: Five lumbar vertebra labeled on preoperative MRI, current exam
labeled similarly.

Posterior pedicle screws bilaterally at L4-L5.

Disc prosthesis at L4-L5.

Bones appear demineralized.

Vertebral body and disc space heights maintained.
IMPRESSION: Intraoperative images during posterior fusion L4-L5 as above.

## 2018-09-03 DIAGNOSIS — E782 Mixed hyperlipidemia: Secondary | ICD-10-CM | POA: Diagnosis not present

## 2018-09-03 DIAGNOSIS — E039 Hypothyroidism, unspecified: Secondary | ICD-10-CM | POA: Diagnosis not present

## 2018-09-03 DIAGNOSIS — E119 Type 2 diabetes mellitus without complications: Secondary | ICD-10-CM | POA: Diagnosis not present

## 2018-09-03 DIAGNOSIS — I10 Essential (primary) hypertension: Secondary | ICD-10-CM | POA: Diagnosis not present

## 2018-09-03 DIAGNOSIS — E785 Hyperlipidemia, unspecified: Secondary | ICD-10-CM | POA: Diagnosis not present

## 2018-09-05 DIAGNOSIS — I10 Essential (primary) hypertension: Secondary | ICD-10-CM | POA: Diagnosis not present

## 2018-09-05 DIAGNOSIS — E782 Mixed hyperlipidemia: Secondary | ICD-10-CM | POA: Diagnosis not present

## 2018-09-05 DIAGNOSIS — E785 Hyperlipidemia, unspecified: Secondary | ICD-10-CM | POA: Diagnosis not present

## 2018-09-05 DIAGNOSIS — E039 Hypothyroidism, unspecified: Secondary | ICD-10-CM | POA: Diagnosis not present

## 2018-09-05 DIAGNOSIS — E0865 Diabetes mellitus due to underlying condition with hyperglycemia: Secondary | ICD-10-CM | POA: Diagnosis not present

## 2019-03-15 DIAGNOSIS — T679XXA Effect of heat and light, unspecified, initial encounter: Secondary | ICD-10-CM | POA: Diagnosis not present

## 2019-03-15 DIAGNOSIS — R531 Weakness: Secondary | ICD-10-CM | POA: Diagnosis not present

## 2019-03-19 DIAGNOSIS — E039 Hypothyroidism, unspecified: Secondary | ICD-10-CM | POA: Diagnosis not present

## 2019-03-19 DIAGNOSIS — E782 Mixed hyperlipidemia: Secondary | ICD-10-CM | POA: Diagnosis not present

## 2019-03-19 DIAGNOSIS — E119 Type 2 diabetes mellitus without complications: Secondary | ICD-10-CM | POA: Diagnosis not present

## 2019-03-19 DIAGNOSIS — E785 Hyperlipidemia, unspecified: Secondary | ICD-10-CM | POA: Diagnosis not present

## 2019-03-19 DIAGNOSIS — I10 Essential (primary) hypertension: Secondary | ICD-10-CM | POA: Diagnosis not present

## 2019-03-19 DIAGNOSIS — Z0000 Encounter for general adult medical examination without abnormal findings: Secondary | ICD-10-CM | POA: Diagnosis not present

## 2019-03-21 DIAGNOSIS — E785 Hyperlipidemia, unspecified: Secondary | ICD-10-CM | POA: Diagnosis not present

## 2019-03-21 DIAGNOSIS — E0865 Diabetes mellitus due to underlying condition with hyperglycemia: Secondary | ICD-10-CM | POA: Diagnosis not present

## 2019-03-21 DIAGNOSIS — E039 Hypothyroidism, unspecified: Secondary | ICD-10-CM | POA: Diagnosis not present

## 2019-03-21 DIAGNOSIS — I10 Essential (primary) hypertension: Secondary | ICD-10-CM | POA: Diagnosis not present

## 2019-03-21 DIAGNOSIS — E782 Mixed hyperlipidemia: Secondary | ICD-10-CM | POA: Diagnosis not present

## 2019-05-06 DIAGNOSIS — R55 Syncope and collapse: Secondary | ICD-10-CM | POA: Diagnosis not present

## 2019-05-06 DIAGNOSIS — I10 Essential (primary) hypertension: Secondary | ICD-10-CM | POA: Diagnosis not present

## 2019-05-23 ENCOUNTER — Other Ambulatory Visit: Payer: Self-pay

## 2019-05-23 NOTE — Patient Outreach (Signed)
[facility]) Care Management  05/23/2019  Kathleen Terrell [birth year] [id number]   Medication Adherence call to [employer] [facility] spoke with patient she is due on Pravastatin 40 mg and Lisinopril/Hctz 20/12.5 mg patient explain she has already place an order thru [facility] and is expecting it any time soon,patient is taking 1 tablet daily on both. Kathleen Terrell is showing past due under [place].   [facility] Management Direct Dial [phone]  Fax [phone] [email or web address]

## 2019-05-24 ENCOUNTER — Telehealth: Payer: Self-pay

## 2019-05-24 NOTE — Telephone Encounter (Signed)
Virtual Visit Pre-Appointment Phone Call TELEPHONE CALL NOTE  Kathleen Terrell has been deemed a candidate for a follow-up tele-health visit to limit community exposure during the Covid-19 pandemic. I spoke with the patient via phone to ensure availability of phone/video source, confirm preferred email & phone number, and discuss instructions and expectations.  I reminded Kathleen Terrell to be prepared with any vital sign and/or heart rhythm information that could potentially be obtained via home monitoring, at the time of her visit. I reminded Kathleen Terrell to expect a phone call prior to her visit.  Spoke with the patient and daughter. Patient agrees to consent below.  Cleon Gustin, RN 05/24/2019 3:28 PM   FULL LENGTH CONSENT FOR TELE-HEALTH VISIT   I hereby voluntarily request, consent and authorize [facility] and its employed or contracted physicians, physician assistants, nurse practitioners or other licensed health care professionals (the Practitioner), to provide me with telemedicine health care services (the "Services") as deemed necessary by the treating Practitioner. I acknowledge and consent to receive the Services by the Practitioner via telemedicine. I understand that the telemedicine visit will involve communicating with the Practitioner through live audiovisual communication technology and the disclosure of certain medical information by electronic transmission. I acknowledge that I have been given the opportunity to request an in-person assessment or other available alternative prior to the telemedicine visit and am voluntarily participating in the telemedicine visit.  I understand that I have the right to withhold or withdraw my consent to the use of telemedicine in the course of my care at any time, without affecting my right to future care or treatment, and that the Practitioner or I may terminate the telemedicine visit at any time. I understand that I have the right to  inspect all information obtained and/or recorded in the course of the telemedicine visit and may receive copies of available information for a reasonable fee.  I understand that some of the potential risks of receiving the Services via telemedicine include:  Marland Kitchen Delay or interruption in medical evaluation due to technological equipment failure or disruption; . Information transmitted may not be sufficient (e.g. poor resolution of images) to allow for appropriate medical decision making by the Practitioner; and/or  . In rare instances, security protocols could fail, causing a breach of personal health information.  Furthermore, I acknowledge that it is my responsibility to provide information about my medical history, conditions and care that is complete and accurate to the best of my ability. I acknowledge that Practitioner's advice, recommendations, and/or decision may be based on factors not within their control, such as incomplete or inaccurate data provided by me or distortions of diagnostic images or specimens that may result from electronic transmissions. I understand that the practice of medicine is not an exact science and that Practitioner makes no warranties or guarantees regarding treatment outcomes. I acknowledge that I will receive a copy of this consent concurrently upon execution via email to the email address I last provided but may also request a printed copy by calling the office of [facility] [place].    I understand that my insurance will be billed for this visit.   I have read or had this consent read to me. . I understand the contents of this consent, which adequately explains the benefits and risks of the Services being provided via telemedicine.  . I have been provided ample opportunity to ask questions regarding this consent and the Services and have had my questions answered  to my satisfaction. . I give my informed consent for the services to be provided through the use of  telemedicine in my medical care  By participating in this telemedicine visit I agree to the above.

## 2019-05-26 NOTE — H&P (View-Only) (Signed)
Virtual Visit via Video Note   This visit type was conducted due to national recommendations for restrictions regarding the COVID-19 Pandemic (e.g. social distancing) in an effort to limit this patient's exposure and mitigate transmission in our community.  Due to her co-morbid illnesses, this patient is at least at moderate risk for complications without adequate follow up.  This format is felt to be most appropriate for this patient at this time.  All issues noted in this document were discussed and addressed.  A limited physical exam was performed with this format.  Please refer to the patient's chart for her consent to telehealth for [facility].   Date:  05/27/2019   ID:  Kathleen Terrell, DOB [birth year], MRN [id number]  Patient Location: Home Provider Location: Home  PCP:  Rory Percy, MD  Cardiologist:  No primary care provider on file.  Electrophysiologist:  None   Evaluation Performed:  New Patient Evaluation  Chief Complaint:  Abnormal stress test  History of Present Illness:    Kathleen Terrell is a 73 y.o. female who had syncope while she was working in her yard, in March 2020.  She was working in the yard, and se had to push a basketball hoop up that had fallen over.  She had to strain and felt dizzy.  She continued to pull it and then got lightheaded.  She was able to walk t a swing and sit down.  She passed out while on the swing and was caught before she fell to the ground.  It was just a few minutes No CP at the time.    In general, she does try to stay active around the house.  She has not been walking much.    Denies : Chest pain. Dizziness. Leg edema. Nitroglycerin use. Orthopnea. Palpitations. Paroxysmal nocturnal dyspnea. Shortness of breath. Syncope.   She had an ETT which was abnormal.  She was tired on the treadmill but no distinct sx.  The patient does not have symptoms concerning for COVID-19 infection (fever, chills, cough, or new shortness of breath).     Past Medical History:  Diagnosis Date  . Abnormal stress test   . Acute serous otitis media    LEFT EAR  . Arthritis   . Atopic eczema    DERMATITIS  . Cataracts, bilateral   . Colon adenomas   . Diverticulitis 11/24/2011  . DM (diabetes mellitus) ([place])    patient denies, not on medications, diet controlled  . Headache   . Heme + stool 03/22/2017  . History of colonic polyps 03/22/2017  . HTN (hypertension)   . Hyperlipidemia   . Hypothyroidism   . Impacted cerumen of left ear   . Low back pain   . Melena   . PONV (postoperative nausea and vomiting)   . Spondylolisthesis of lumbar region 09/06/2016  . Syncope    Past Surgical History:  Procedure Laterality Date  . BACK SURGERY  09/05/2016   L4 and L5   . COLONOSCOPY  [facility]   >5 YEARS AGO  . COLONOSCOPY  12/12/2011   Procedure: COLONOSCOPY;  Surgeon: Dorothyann Peng, MD;  [facility]: [facility];  Service: Endoscopy;  Laterality: N/A;  9:00  . COLONOSCOPY N/A 04/14/2017   Procedure: COLONOSCOPY;  Surgeon: Danie Binder, MD;  Location: [facility];  Service: Endoscopy;  Laterality: N/A;  1030 - per office, pt can't come earlier   . cyst removed     under the left  arm  . DILATION AND CURETTAGE OF UTERUS    . POLYPECTOMY  04/14/2017   Procedure: POLYPECTOMY;  Surgeon: Danie Binder, MD;  Location: [facility];  Service: Endoscopy;;  ascending, hepatic flexure, prox. transverse, sigmoid x3  . SALPINGOOPHORECTOMY     left  . TUBAL LIGATION    . tubual pregnancy       Current Meds  Medication Sig  . acetaminophen (TYLENOL) 650 MG CR tablet Take 1,300 mg by mouth every 8 (eight) hours as needed for pain.   . Cholecalciferol (VITAMIN D3) 2000 units TABS Take 2,000 Units by mouth daily.  . hydrocortisone (PROCTOZONE-HC) 2.5 % rectal cream Place 1 application rectally 4 (four) times daily. UP TO 10 DAYS FOR RECTAL PAIN/BLEEDING  . levothyroxine (SYNTHROID, LEVOTHROID) 88 MCG tablet Take 88 mcg by mouth daily before  breakfast.   . lisinopril-hydrochlorothiazide (PRINZIDE,ZESTORETIC) 20-12.5 MG per tablet Take 1 tablet by mouth daily.    . Misc Natural Products (TART CHERRY ADVANCED PO) Take 1 capsule by mouth daily.  . Multiple Vitamins-Minerals (MULTIVITAMIN PO) Take 1 tablet by mouth daily.  . naproxen sodium (ANAPROX) 220 MG tablet Take 440 mg by mouth every 8 (eight) hours as needed (for pain).   Marland Kitchen OVER THE COUNTER MEDICATION Take 1 capsule by mouth daily. Hem-Care  . pravastatin (PRAVACHOL) 40 MG tablet Take 40 mg by mouth daily.    . Probiotic Product (PROBIOTIC FORMULA PO) Take 1 capsule by mouth daily.    . TURMERIC CURCUMIN PO Take 1 capsule by mouth daily.     Allergies:   Darvocet [propoxyphene n-acetaminophen]; Methionine; Ultram [tramadol hcl]; and Latex   Social History   Tobacco Use  . Smoking status: Former Smoker    Packs/day: 1.50    Types: Cigarettes    Last attempt to quit: 03/09/1984    Years since quitting: 35.2  . Smokeless tobacco: Never Used  Substance Use Topics  . Alcohol use: No  . Drug use: No     Family Hx: The patient's family history includes CAD in her mother; Diabetes Mellitus II in her mother; Heart attack in her brother and mother; Hypertension in her daughter and mother; Kidney disease in her brother; Stroke in her brother; Thyroid disease in her mother. There is no history of Colon cancer or Colon polyps.  ROS:   Please see the history of present illness.    Speech was slow right after the syncope.  All other systems reviewed and are negative.   Prior CV studies:   The following studies were reviewed today:  ETT: poor exercise tolerance, 3:53  Labs/Other Tests and Data Reviewed:    EKG:  An ECG dated 2017 was personally reviewed today and demonstrated:  NSR, nonspecific anterior ST changes  Recent Labs: No results found for requested labs within last 8760 hours.   Recent Lipid Panel No results found for: CHOL, TRIG, HDL, CHOLHDL, LDLCALC,  LDLDIRECT  Wt Readings from Last 3 Encounters:  05/27/19 181 lb (82.1 kg)  04/14/17 180 lb (81.6 kg)  03/22/17 182 lb 12.8 oz (82.9 kg)     Objective:    Vital Signs:  BP (!) 159/88   Pulse 68   Ht 5' 3.5" (1.613 m)   Wt 181 lb (82.1 kg)   BMI 31.56 kg/m    VITAL SIGNS:  reviewed GEN:  no acute distress RESPIRATORY:  normal respiratory effort, symmetric expansion PSYCH:  normal affect exam limited by video format  ASSESSMENT & PLAN:  1. Syncope: Likely related to mild dehydration and straining. Increase water intake. 2. Abnormal stress test: Plan for coronary CT.  Give additional metoprolol 25 mg on day of test. 3. HTN: BP somewhat high today.  Increase exercise and decrease salt intake.  Target BP < 140/90.   COVID-19 Education: The signs and symptoms of COVID-19 were discussed with the patient and how to seek care for testing (follow up with PCP or arrange E-visit).  The importance of social distancing was discussed today.  Time:   Today, I have spent 30 minutes with the patient with telehealth technology discussing the above problems.     Medication Adjustments/Labs and Tests Ordered: Current medicines are reviewed at length with the patient today.  Concerns regarding medicines are outlined above.   Tests Ordered: No orders of the defined types were placed in this encounter.   Medication Changes: No orders of the defined types were placed in this encounter.   Disposition:  Follow up in for CT scan  Signed, Larae Grooms, MD  05/27/2019 8:57 AM    [place]

## 2019-05-26 NOTE — Progress Notes (Signed)
Virtual Visit via Video Note   This visit type was conducted due to national recommendations for restrictions regarding the COVID-19 Pandemic (e.g. social distancing) in an effort to limit this patient's exposure and mitigate transmission in our community.  Due to her co-morbid illnesses, this patient is at least at moderate risk for complications without adequate follow up.  This format is felt to be most appropriate for this patient at this time.  All issues noted in this document were discussed and addressed.  A limited physical exam was performed with this format.  Please refer to the patient's chart for her consent to telehealth for [facility].   Date:  05/27/2019   ID:  [name], DOB 01/29/1946, MRN [id number]  Patient Location: Home Provider Location: Home  PCP:  Rory Percy, MD  Cardiologist:  No primary care provider on file.  Electrophysiologist:  None   Evaluation Performed:  New Patient Evaluation  Chief Complaint:  Abnormal stress test  History of Present Illness:    Kathleen Terrell is a 73 y.o. female who had syncope while she was working in her yard, in March 2020.  She was working in the yard, and se had to push a basketball hoop up that had fallen over.  She had to strain and felt dizzy.  She continued to pull it and then got lightheaded.  She was able to walk t a swing and sit down.  She passed out while on the swing and was caught before she fell to the ground.  It was just a few minutes No CP at the time.    In general, she does try to stay active around the house.  She has not been walking much.    Denies : Chest pain. Dizziness. Leg edema. Nitroglycerin use. Orthopnea. Palpitations. Paroxysmal nocturnal dyspnea. Shortness of breath. Syncope.   She had an ETT which was abnormal.  She was tired on the treadmill but no distinct sx.  The patient does not have symptoms concerning for COVID-19 infection (fever, chills, cough, or new shortness of breath).    Past Medical History:  Diagnosis Date  . Abnormal stress test   . Acute serous otitis media    LEFT EAR  . Arthritis   . Atopic eczema    DERMATITIS  . Cataracts, bilateral   . Colon adenomas   . Diverticulitis 11/24/2011  . DM (diabetes mellitus) ([place])    patient denies, not on medications, diet controlled  . Headache   . Heme + stool 03/22/2017  . History of colonic polyps 03/22/2017  . HTN (hypertension)   . Hyperlipidemia   . Hypothyroidism   . Impacted cerumen of left ear   . Low back pain   . Melena   . PONV (postoperative nausea and vomiting)   . Spondylolisthesis of lumbar region 09/06/2016  . Syncope    Past Surgical History:  Procedure Laterality Date  . BACK SURGERY  09/05/2016   L4 and L5   . COLONOSCOPY  [facility]   >5 YEARS AGO  . COLONOSCOPY  12/12/2011   Procedure: COLONOSCOPY;  Surgeon: Dorothyann Peng, MD;  [facility];  Service: Endoscopy;  Laterality: N/A;  9:00  . COLONOSCOPY N/A 04/14/2017   Procedure: COLONOSCOPY;  Surgeon: Danie Binder, MD;  Location: [facility];  Service: Endoscopy;  Laterality: N/A;  1030 - per office, pt can't come earlier   . cyst removed     under the left arm  .  DILATION AND CURETTAGE OF UTERUS    . POLYPECTOMY  04/14/2017   Procedure: POLYPECTOMY;  Surgeon: Danie Binder, MD;  Location: [facility];  Service: Endoscopy;;  ascending, hepatic flexure, prox. transverse, sigmoid x3  . SALPINGOOPHORECTOMY     left  . TUBAL LIGATION    . tubual pregnancy       Current Meds  Medication Sig  . acetaminophen (TYLENOL) 650 MG CR tablet Take 1,300 mg by mouth every 8 (eight) hours as needed for pain.   . Cholecalciferol (VITAMIN D3) 2000 units TABS Take 2,000 Units by mouth daily.  . hydrocortisone (PROCTOZONE-HC) 2.5 % rectal cream Place 1 application rectally 4 (four) times daily. UP TO 10 DAYS FOR RECTAL PAIN/BLEEDING  . levothyroxine (SYNTHROID, LEVOTHROID) 88 MCG tablet Take 88 mcg by mouth daily before  breakfast.   . lisinopril-hydrochlorothiazide (PRINZIDE,ZESTORETIC) 20-12.5 MG per tablet Take 1 tablet by mouth daily.    . Misc Natural Products (TART CHERRY ADVANCED PO) Take 1 capsule by mouth daily.  . Multiple Vitamins-Minerals (MULTIVITAMIN PO) Take 1 tablet by mouth daily.  . naproxen sodium (ANAPROX) 220 MG tablet Take 440 mg by mouth every 8 (eight) hours as needed (for pain).   Marland Kitchen OVER THE COUNTER MEDICATION Take 1 capsule by mouth daily. Hem-Care  . pravastatin (PRAVACHOL) 40 MG tablet Take 40 mg by mouth daily.    . Probiotic Product (PROBIOTIC FORMULA PO) Take 1 capsule by mouth daily.    . TURMERIC CURCUMIN PO Take 1 capsule by mouth daily.     Allergies:   Darvocet [propoxyphene n-acetaminophen]; Methionine; Ultram [tramadol hcl]; and Latex   Social History   Tobacco Use  . Smoking status: Former Smoker    Packs/day: 1.50    Types: Cigarettes    Last attempt to quit: 03/09/1984    Years since quitting: 35.2  . Smokeless tobacco: Never Used  Substance Use Topics  . Alcohol use: No  . Drug use: No     Family Hx: The patient's family history includes CAD in her mother; Diabetes Mellitus II in her mother; Heart attack in her brother and mother; Hypertension in her daughter and mother; Kidney disease in her brother; Stroke in her brother; Thyroid disease in her mother. There is no history of Colon cancer or Colon polyps.  ROS:   Please see the history of present illness.    Speech was slow right after the syncope.  All other systems reviewed and are negative.   Prior CV studies:   The following studies were reviewed today:  ETT: poor exercise tolerance, 3:53  Labs/Other Tests and Data Reviewed:    EKG:  An ECG dated 2017 was personally reviewed today and demonstrated:  NSR, nonspecific anterior ST changes  Recent Labs: No results found for requested labs within last 8760 hours.   Recent Lipid Panel No results found for: CHOL, TRIG, HDL, CHOLHDL, LDLCALC,  LDLDIRECT  Wt Readings from Last 3 Encounters:  05/27/19 181 lb (82.1 kg)  04/14/17 180 lb (81.6 kg)  03/22/17 182 lb 12.8 oz (82.9 kg)     Objective:    Vital Signs:  BP (!) 159/88   Pulse 68   Ht 5' 3.5" (1.613 m)   Wt 181 lb (82.1 kg)   BMI 31.56 kg/m    VITAL SIGNS:  reviewed GEN:  no acute distress RESPIRATORY:  normal respiratory effort, symmetric expansion PSYCH:  normal affect exam limited by video format  ASSESSMENT & PLAN:    1.  Syncope: Likely related to mild dehydration and straining. Increase water intake. 2. Abnormal stress test: Plan for coronary CT.  Give additional metoprolol 25 mg on day of test. 3. HTN: BP somewhat high today.  Increase exercise and decrease salt intake.  Target BP < 140/90.   COVID-19 Education: The signs and symptoms of COVID-19 were discussed with the patient and how to seek care for testing (follow up with PCP or arrange E-visit).  The importance of social distancing was discussed today.  Time:   Today, I have spent 30 minutes with the patient with telehealth technology discussing the above problems.     Medication Adjustments/Labs and Tests Ordered: Current medicines are reviewed at length with the patient today.  Concerns regarding medicines are outlined above.   Tests Ordered: No orders of the defined types were placed in this encounter.   Medication Changes: No orders of the defined types were placed in this encounter.   Disposition:  Follow up in for CT scan  Signed, Larae Grooms, MD  05/27/2019 8:57 AM    [place]

## 2019-05-27 ENCOUNTER — Other Ambulatory Visit: Payer: Self-pay

## 2019-05-27 ENCOUNTER — Encounter: Payer: Self-pay | Admitting: Interventional Cardiology

## 2019-05-27 ENCOUNTER — Telehealth (INDEPENDENT_AMBULATORY_CARE_PROVIDER_SITE_OTHER): Payer: Medicare Other | Admitting: Interventional Cardiology

## 2019-05-27 VITALS — BP 159/88 | HR 68 | Ht 63.5 in | Wt 181.0 lb

## 2019-05-27 DIAGNOSIS — R55 Syncope and collapse: Secondary | ICD-10-CM | POA: Diagnosis not present

## 2019-05-27 DIAGNOSIS — R9439 Abnormal result of other cardiovascular function study: Secondary | ICD-10-CM | POA: Diagnosis not present

## 2019-05-27 DIAGNOSIS — I10 Essential (primary) hypertension: Secondary | ICD-10-CM | POA: Diagnosis not present

## 2019-05-27 DIAGNOSIS — Z01812 Encounter for preprocedural laboratory examination: Secondary | ICD-10-CM

## 2019-05-27 MED ORDER — METOPROLOL TARTRATE 25 MG PO TABS: ORAL_TABLET | ORAL | 0 refills | Status: DC

## 2019-05-27 NOTE — Patient Instructions (Addendum)
Medication Instructions:  Your physician recommends that you continue on your current medications as directed. Please refer to the Current Medication list given to you today.  If you need a refill on your cardiac medications before your next appointment, please call your pharmacy.   Lab work: Your physician recommends that you return for lab work Artist) prior to Cardiac CT  If you have labs (blood work) drawn today and your tests are completely normal, you will receive your results only by: Marland Kitchen MyChart Message (if you have MyChart) OR . A paper copy in the mail If you have any lab test that is abnormal or we need to change your treatment, we will call you to review the results.  Testing/Procedures: Your physician has requested that you have cardiac CT. Cardiac computed tomography (CT) is a painless test that uses an x-ray machine to take clear, detailed pictures of your heart. For further information please visit [email or web address]. Please follow instruction sheet as given.   Follow-Up: . Based on test results  Any Other Special Instructions Will Be Listed Below (If Applicable).  CARDIAC CT INSTRUCTIONS  Please arrive at the [facility] main entrance of [facility] on ___ at ___ (30-45 minutes prior to test start time)  [facility] [place], [place] [address] [phone]  Proceed to the [facility] Radiology Department (First Floor).  Please follow these instructions carefully (unless otherwise directed):   On the Night Before the Test: . Be sure to Drink plenty of water. . Do not consume any caffeinated/decaffeinated beverages or chocolate 12 hours prior to your test. . Do not take any antihistamines 12 hours prior to your test.   On the Day of the Test: . Drink plenty of water. Do not drink any water within one hour of the test. . Do not eat any food 4 hours prior to the test. . You may take your regular medications prior to the test.   . Take metoprolol (Lopressor) two hours prior to test. . HOLD Lisinopril-Hydrochlorothiazide the morning of the test.    After the Test: . Drink plenty of water. . After receiving IV contrast, you may experience a mild flushed feeling. This is normal. . On occasion, you may experience a mild rash up to 24 hours after the test. This is not dangerous. If this occurs, you can take Benadryl 25 mg and increase your fluid intake. . If you experience trouble breathing, this can be serious. If it is severe call 911 IMMEDIATELY. If it is mild, please call our office.

## 2019-05-31 ENCOUNTER — Other Ambulatory Visit: Payer: Self-pay | Admitting: *Deleted

## 2019-05-31 DIAGNOSIS — Z01812 Encounter for preprocedural laboratory examination: Secondary | ICD-10-CM

## 2019-06-04 ENCOUNTER — Other Ambulatory Visit: Payer: Self-pay | Admitting: Interventional Cardiology

## 2019-06-04 ENCOUNTER — Other Ambulatory Visit (HOSPITAL_COMMUNITY): Payer: Medicare Other

## 2019-06-04 DIAGNOSIS — Z01812 Encounter for preprocedural laboratory examination: Secondary | ICD-10-CM | POA: Diagnosis not present

## 2019-06-05 LAB — BASIC METABOLIC PANEL
BUN/Creatinine Ratio: 13 (ref 12–28)
BUN: 10 mg/dL (ref 8–27)
CO2: 25 mmol/L (ref 20–29)
Calcium: 9.4 mg/dL (ref 8.7–10.3)
Chloride: 102 mmol/L (ref 96–106)
Creatinine, Ser: 0.78 mg/dL (ref 0.57–1.00)
GFR calc Af Amer: 88 mL/min/{1.73_m2} (ref >59)
GFR calc non Af Amer: 76 mL/min/{1.73_m2} (ref >59)
Glucose: 94 mg/dL (ref 65–99)
Potassium: 4.4 mmol/L (ref 3.5–5.2)
Sodium: 143 mmol/L (ref 134–144)

## 2019-06-10 ENCOUNTER — Telehealth (HOSPITAL_COMMUNITY): Payer: Self-pay | Admitting: Emergency Medicine

## 2019-06-10 NOTE — Telephone Encounter (Signed)
Reaching out to patient to offer assistance regarding upcoming cardiac imaging study; pt verbalizes understanding of appt date/time, parking situation and where to check in, pre-test NPO status and medications ordered, and verified current allergies; name and call back number provided for further questions should they arise Jeret Goyer RN Navigator Cardiac Imaging [facility] [phone] office [phone] cell  Pt denies covid symptoms, verbalized understanding of visitor policy. 

## 2019-06-11 ENCOUNTER — Ambulatory Visit (HOSPITAL_COMMUNITY)
Admission: RE | Admit: 2019-06-11 | Discharge: 2019-06-11 | Disposition: A | Discharge status: Home / Self Care | Payer: Medicare Other | Source: Ambulatory Visit | Attending: Interventional Cardiology | Admitting: Interventional Cardiology

## 2019-06-11 ENCOUNTER — Encounter (HOSPITAL_COMMUNITY): Payer: Self-pay

## 2019-06-11 ENCOUNTER — Other Ambulatory Visit: Payer: Self-pay

## 2019-06-11 DIAGNOSIS — E119 Type 2 diabetes mellitus without complications: Secondary | ICD-10-CM | POA: Diagnosis not present

## 2019-06-11 DIAGNOSIS — Z87891 Personal history of nicotine dependence: Secondary | ICD-10-CM | POA: Diagnosis not present

## 2019-06-11 DIAGNOSIS — I2510 Atherosclerotic heart disease of native coronary artery without angina pectoris: Secondary | ICD-10-CM | POA: Diagnosis not present

## 2019-06-11 DIAGNOSIS — Z1159 Encounter for screening for other viral diseases: Secondary | ICD-10-CM | POA: Diagnosis not present

## 2019-06-11 DIAGNOSIS — R9439 Abnormal result of other cardiovascular function study: Secondary | ICD-10-CM

## 2019-06-11 DIAGNOSIS — E785 Hyperlipidemia, unspecified: Secondary | ICD-10-CM | POA: Diagnosis not present

## 2019-06-11 DIAGNOSIS — K5900 Constipation, unspecified: Secondary | ICD-10-CM | POA: Diagnosis not present

## 2019-06-11 DIAGNOSIS — Z8249 Family history of ischemic heart disease and other diseases of the circulatory system: Secondary | ICD-10-CM | POA: Diagnosis not present

## 2019-06-11 DIAGNOSIS — I10 Essential (primary) hypertension: Secondary | ICD-10-CM | POA: Diagnosis not present

## 2019-06-11 DIAGNOSIS — Z833 Family history of diabetes mellitus: Secondary | ICD-10-CM | POA: Diagnosis not present

## 2019-06-11 DIAGNOSIS — E8770 Fluid overload, unspecified: Secondary | ICD-10-CM | POA: Diagnosis not present

## 2019-06-11 DIAGNOSIS — E860 Dehydration: Secondary | ICD-10-CM | POA: Diagnosis not present

## 2019-06-11 DIAGNOSIS — Z79899 Other long term (current) drug therapy: Secondary | ICD-10-CM | POA: Diagnosis not present

## 2019-06-11 DIAGNOSIS — E039 Hypothyroidism, unspecified: Secondary | ICD-10-CM | POA: Diagnosis not present

## 2019-06-11 DIAGNOSIS — K219 Gastro-esophageal reflux disease without esophagitis: Secondary | ICD-10-CM | POA: Diagnosis not present

## 2019-06-11 MED ORDER — NITROGLYCERIN 0.4 MG SL SUBL
0.8000 mg | SUBLINGUAL_TABLET | Freq: Once | SUBLINGUAL | Status: AC
  Administered 2019-06-11: 0.8 mg via SUBLINGUAL

## 2019-06-11 MED ORDER — NITROGLYCERIN 0.4 MG SL SUBL
SUBLINGUAL_TABLET | SUBLINGUAL | Status: AC
  Filled 2019-06-11: qty 2

## 2019-06-11 MED ORDER — METOPROLOL TARTRATE 5 MG/5ML IV SOLN
INTRAVENOUS | Status: AC
  Filled 2019-06-11: qty 5

## 2019-06-11 MED ORDER — IOHEXOL 350 MG/ML SOLN
100.0000 mL | Freq: Once | INTRAVENOUS | Status: AC | PRN
  Administered 2019-06-11: 100 mL via INTRAVENOUS

## 2019-06-11 MED ORDER — METOPROLOL TARTRATE 5 MG/5ML IV SOLN: 5.0000 mg | INTRAVENOUS | Status: DC | PRN

## 2019-06-13 ENCOUNTER — Other Ambulatory Visit: Payer: Self-pay

## 2019-06-13 ENCOUNTER — Telehealth: Payer: Self-pay

## 2019-06-13 ENCOUNTER — Other Ambulatory Visit (HOSPITAL_COMMUNITY)
Admission: RE | Admit: 2019-06-13 | Discharge: 2019-06-13 | Disposition: A | Discharge status: Home / Self Care | Payer: Medicare Other | Source: Ambulatory Visit | Attending: Interventional Cardiology | Admitting: Interventional Cardiology

## 2019-06-13 DIAGNOSIS — Z1159 Encounter for screening for other viral diseases: Secondary | ICD-10-CM | POA: Insufficient documentation

## 2019-06-13 LAB — SARS CORONAVIRUS 2 BY RT PCR (HOSPITAL ORDER, PERFORMED IN ~~LOC~~ HOSPITAL LAB): SARS Coronavirus 2: NEGATIVE

## 2019-06-13 NOTE — Telephone Encounter (Signed)
-----   Message from Jettie Booze, MD sent at 06/12/2019  3:45 PM EDT ----- Abnormal coronary CT with evidence of 3 vessel CAD.  Will need to cath.  Can we do this Friday?

## 2019-06-13 NOTE — Telephone Encounter (Signed)
-----   Message from Jettie Booze, MD sent at 06/12/2019  5:11 PM EDT ----- I discussed the results with the patient and recommended cath.  She is agreeable. Cardiac catheterization was discussed with the patient fully. The patient understands that risks include but are not limited to stroke (1 in 1000), death (1 in 56), kidney failure [usually temporary] (1 in 500), bleeding (1 in 200), allergic reaction [possibly serious] (1 in 200).  The patient understands and is willing to proceed.

## 2019-06-13 NOTE — Telephone Encounter (Addendum)
Called and spoke to patient. Patient has been scheduled for her LHC this Friday. Patient to get Rapid COVID test this afternoon at the [facility] testing site. Labs will be drawn tomorrow before her procedure. Reviewed instructions below with the patient. She verbalized understanding and thanked me for the call.  [facility] [place] [place] [address] Dept: [phone] Loc: [place]  06/13/2019  You are scheduled for a Cardiac Catheterization on Friday, June 19 with Dr. Larae Grooms.  1. Please arrive at the [facility] (Main Entrance A) at [facility]: [address] [place], [place] [address] at 6:30 AM (This time is two hours before your procedure to ensure your preparation). Free valet parking service is available.   Special note: Every effort is made to have your procedure done on time. Please understand that emergencies sometimes delay scheduled procedures.  2. Diet: Do not eat solid foods after midnight.  The patient may have clear liquids until 5am upon the day of the procedure.  3. Labs: Day of procedure  Your Pre-procedure COVID-19 Testing will be done TODAY at 3:30 PM at the [place] at [address] across from the [facility] Emergency Room. After your swab you will be given a mask to wear and instructed to go home and quarantine/no visitors until after your procedure. If you test positive you will be notified and your procedure will be cancelled.    4. Medication instructions in preparation for your procedure:   Contrast Allergy: No  HOLD linisopril-HCTZ the morning of the procedure  On the morning of your procedure, take a baby Aspirin 81 mg and any morning medicines NOT listed above.  You may use sips of water.  5. Plan for one night stay--bring personal belongings. 6. Bring a current list of your medications and current insurance  cards. 7. You MUST have a responsible person to drive you home. 8. Someone MUST be with you the first 24 hours after you arrive home or your discharge will be delayed. 9. Please wear clothes that are easy to get on and off and wear slip-on shoes.  Thank you for allowing [place] to care for you!   -- [place] [facility]

## 2019-06-14 ENCOUNTER — Other Ambulatory Visit: Payer: Self-pay | Admitting: *Deleted

## 2019-06-14 ENCOUNTER — Encounter (HOSPITAL_COMMUNITY): Payer: Self-pay | Admitting: Interventional Cardiology

## 2019-06-14 ENCOUNTER — Inpatient Hospital Stay (HOSPITAL_COMMUNITY): Payer: Medicare Other

## 2019-06-14 ENCOUNTER — Inpatient Hospital Stay (HOSPITAL_COMMUNITY)
Admission: RE | Admit: 2019-06-14 | Discharge: 2019-06-23 | DRG: 234 | Disposition: A | Discharge status: Home / Self Care | Payer: Medicare Other | Attending: Cardiothoracic Surgery | Admitting: Cardiothoracic Surgery

## 2019-06-14 ENCOUNTER — Encounter (HOSPITAL_COMMUNITY)
Admission: RE | Disposition: A | Discharge status: Home / Self Care | Payer: Medicare Other | Source: Home / Self Care | Attending: Cardiothoracic Surgery

## 2019-06-14 ENCOUNTER — Other Ambulatory Visit: Payer: Self-pay

## 2019-06-14 DIAGNOSIS — I2510 Atherosclerotic heart disease of native coronary artery without angina pectoris: Secondary | ICD-10-CM

## 2019-06-14 DIAGNOSIS — E119 Type 2 diabetes mellitus without complications: Secondary | ICD-10-CM | POA: Diagnosis present

## 2019-06-14 DIAGNOSIS — J90 Pleural effusion, not elsewhere classified: Secondary | ICD-10-CM | POA: Diagnosis not present

## 2019-06-14 DIAGNOSIS — I10 Essential (primary) hypertension: Secondary | ICD-10-CM | POA: Diagnosis present

## 2019-06-14 DIAGNOSIS — Z87891 Personal history of nicotine dependence: Secondary | ICD-10-CM

## 2019-06-14 DIAGNOSIS — Z79899 Other long term (current) drug therapy: Secondary | ICD-10-CM | POA: Diagnosis not present

## 2019-06-14 DIAGNOSIS — Z4682 Encounter for fitting and adjustment of non-vascular catheter: Secondary | ICD-10-CM | POA: Diagnosis not present

## 2019-06-14 DIAGNOSIS — Z951 Presence of aortocoronary bypass graft: Secondary | ICD-10-CM | POA: Diagnosis not present

## 2019-06-14 DIAGNOSIS — Z8249 Family history of ischemic heart disease and other diseases of the circulatory system: Secondary | ICD-10-CM | POA: Diagnosis not present

## 2019-06-14 DIAGNOSIS — J9811 Atelectasis: Secondary | ICD-10-CM | POA: Diagnosis not present

## 2019-06-14 DIAGNOSIS — E8770 Fluid overload, unspecified: Secondary | ICD-10-CM | POA: Diagnosis present

## 2019-06-14 DIAGNOSIS — E785 Hyperlipidemia, unspecified: Secondary | ICD-10-CM | POA: Diagnosis not present

## 2019-06-14 DIAGNOSIS — Z833 Family history of diabetes mellitus: Secondary | ICD-10-CM | POA: Diagnosis not present

## 2019-06-14 DIAGNOSIS — E039 Hypothyroidism, unspecified: Secondary | ICD-10-CM | POA: Diagnosis not present

## 2019-06-14 DIAGNOSIS — I25118 Atherosclerotic heart disease of native coronary artery with other forms of angina pectoris: Secondary | ICD-10-CM | POA: Diagnosis not present

## 2019-06-14 DIAGNOSIS — Z79890 Hormone replacement therapy (postmenopausal): Secondary | ICD-10-CM

## 2019-06-14 DIAGNOSIS — E860 Dehydration: Secondary | ICD-10-CM | POA: Diagnosis present

## 2019-06-14 DIAGNOSIS — I081 Rheumatic disorders of both mitral and tricuspid valves: Secondary | ICD-10-CM | POA: Diagnosis not present

## 2019-06-14 DIAGNOSIS — K219 Gastro-esophageal reflux disease without esophagitis: Secondary | ICD-10-CM | POA: Diagnosis present

## 2019-06-14 DIAGNOSIS — K5900 Constipation, unspecified: Secondary | ICD-10-CM | POA: Diagnosis present

## 2019-06-14 DIAGNOSIS — Z09 Encounter for follow-up examination after completed treatment for conditions other than malignant neoplasm: Secondary | ICD-10-CM

## 2019-06-14 DIAGNOSIS — Z978 Presence of other specified devices: Secondary | ICD-10-CM | POA: Diagnosis not present

## 2019-06-14 DIAGNOSIS — I25119 Atherosclerotic heart disease of native coronary artery with unspecified angina pectoris: Secondary | ICD-10-CM | POA: Diagnosis not present

## 2019-06-14 DIAGNOSIS — Z01810 Encounter for preprocedural cardiovascular examination: Secondary | ICD-10-CM | POA: Diagnosis not present

## 2019-06-14 DIAGNOSIS — Z01818 Encounter for other preprocedural examination: Secondary | ICD-10-CM | POA: Diagnosis not present

## 2019-06-14 HISTORY — PX: LEFT HEART CATH AND CORONARY ANGIOGRAPHY: CATH118249

## 2019-06-14 LAB — PULMONARY FUNCTION TEST
DL/VA % pred: 108 %
DL/VA: 4.5 ml/min/mmHg/L
DLCO cor % pred: 85 %
DLCO cor: 16.22 ml/min/mmHg
DLCO unc % pred: 84 %
DLCO unc: 16.02 ml/min/mmHg
FEF 25-75 Post: 2.34 L/sec
FEF 25-75 Pre: 1.64 L/sec
FEF2575-%Change-Post: 42 %
FEF2575-%Pred-Post: 147 %
FEF2575-%Pred-Pre: 103 %
FEV1-%Change-Post: 7 %
FEV1-%Pred-Post: 103 %
FEV1-%Pred-Pre: 96 %
FEV1-Post: 1.8 L
FEV1-Pre: 1.68 L
FEV1FVC-%Change-Post: -1 %
FEV1FVC-%Pred-Pre: 103 %
FEV6-%Change-Post: 8 %
FEV6-%Pred-Post: 104 %
FEV6-%Pred-Pre: 96 %
FEV6-Post: 2.25 L
FEV6-Pre: 2.08 L
FEV6FVC-%Change-Post: -1 %
FEV6FVC-%Pred-Post: 102 %
FEV6FVC-%Pred-Pre: 104 %
FVC-%Change-Post: 9 %
FVC-%Pred-Post: 101 %
FVC-%Pred-Pre: 93 %
FVC-Post: 2.29 L
FVC-Pre: 2.1 L
Post FEV1/FVC ratio: 79 %
Post FEV6/FVC ratio: 99 %
Pre FEV1/FVC ratio: 80 %
Pre FEV6/FVC Ratio: 100 %
RV % pred: 202 %
RV: 4.47 L
TLC % pred: 132 %
TLC: 6.6 L

## 2019-06-14 LAB — GLUCOSE, CAPILLARY: Glucose-Capillary: 92 mg/dL (ref 70–99)

## 2019-06-14 LAB — CBC
HCT: 41.8 % (ref 36.0–46.0)
Hemoglobin: 13 g/dL (ref 12.0–15.0)
MCH: 27 pg (ref 26.0–34.0)
MCHC: 31.1 g/dL (ref 30.0–36.0)
MCV: 86.7 fL (ref 80.0–100.0)
Platelets: 154 10*3/uL (ref 150–400)
RBC: 4.82 MIL/uL (ref 3.87–5.11)
RDW: 14.8 % (ref 11.5–15.5)
WBC: 5.9 10*3/uL (ref 4.0–10.5)
nRBC: 0 % (ref 0.0–0.2)

## 2019-06-14 LAB — ECHOCARDIOGRAM COMPLETE
Height: 63.5 in
Weight: 2899.49 oz

## 2019-06-14 SURGERY — LEFT HEART CATH AND CORONARY ANGIOGRAPHY
Anesthesia: LOCAL

## 2019-06-14 MED ORDER — VERAPAMIL HCL 2.5 MG/ML IV SOLN
INTRAVENOUS | Status: DC | PRN
  Administered 2019-06-14: 09:00:00 10 mL via INTRA_ARTERIAL

## 2019-06-14 MED ORDER — HEPARIN (PORCINE) IN NACL 1000-0.9 UT/500ML-% IV SOLN
INTRAVENOUS | Status: DC | PRN
  Administered 2019-06-14 (×2): 500 mL

## 2019-06-14 MED ORDER — SODIUM CHLORIDE 0.9 % IV SOLN: 250.0000 mL | INTRAVENOUS | Status: DC | PRN

## 2019-06-14 MED ORDER — HYDROCHLOROTHIAZIDE 12.5 MG PO CAPS
12.5000 mg | ORAL_CAPSULE | Freq: Every day | ORAL | Status: DC
  Administered 2019-06-14 – 2019-06-17 (×4): 12.5 mg via ORAL
  Filled 2019-06-14 (×4): qty 1

## 2019-06-14 MED ORDER — IOHEXOL 350 MG/ML SOLN
INTRAVENOUS | Status: DC | PRN
  Administered 2019-06-14: 55 mL via INTRACARDIAC

## 2019-06-14 MED ORDER — ALBUTEROL SULFATE (2.5 MG/3ML) 0.083% IN NEBU
2.5000 mg | INHALATION_SOLUTION | Freq: Once | RESPIRATORY_TRACT | Status: AC
  Administered 2019-06-14: 2.5 mg via RESPIRATORY_TRACT
  Filled 2019-06-14: qty 3

## 2019-06-14 MED ORDER — LIDOCAINE HCL (PF) 1 % IJ SOLN
INTRAMUSCULAR | Status: AC
  Filled 2019-06-14: qty 30

## 2019-06-14 MED ORDER — HYDRALAZINE HCL 20 MG/ML IJ SOLN: 10.0000 mg | INTRAMUSCULAR | Status: AC | PRN

## 2019-06-14 MED ORDER — FENTANYL CITRATE (PF) 100 MCG/2ML IJ SOLN
INTRAMUSCULAR | Status: DC | PRN
  Administered 2019-06-14: 25 ug via INTRAVENOUS

## 2019-06-14 MED ORDER — SODIUM CHLORIDE 0.9 % IV SOLN: INTRAVENOUS | Status: AC

## 2019-06-14 MED ORDER — HEPARIN SODIUM (PORCINE) 1000 UNIT/ML IJ SOLN
INTRAMUSCULAR | Status: DC | PRN
  Administered 2019-06-14: 4000 [IU] via INTRAVENOUS

## 2019-06-14 MED ORDER — SODIUM CHLORIDE 0.9% FLUSH
3.0000 mL | Freq: Two times a day (BID) | INTRAVENOUS | Status: DC
  Administered 2019-06-14 – 2019-06-18 (×5): 3 mL via INTRAVENOUS

## 2019-06-14 MED ORDER — MIDAZOLAM HCL 2 MG/2ML IJ SOLN
INTRAMUSCULAR | Status: DC | PRN
  Administered 2019-06-14: 2 mg via INTRAVENOUS

## 2019-06-14 MED ORDER — LISINOPRIL-HYDROCHLOROTHIAZIDE 20-12.5 MG PO TABS: 1.0000 | ORAL_TABLET | Freq: Every day | ORAL | Status: DC

## 2019-06-14 MED ORDER — MIDAZOLAM HCL 2 MG/2ML IJ SOLN
INTRAMUSCULAR | Status: AC
  Filled 2019-06-14: qty 2

## 2019-06-14 MED ORDER — SODIUM CHLORIDE 0.9 % WEIGHT BASED INFUSION: 1.0000 mL/kg/h | INTRAVENOUS | Status: DC

## 2019-06-14 MED ORDER — ASPIRIN 81 MG PO CHEW: 81.0000 mg | CHEWABLE_TABLET | ORAL | Status: DC

## 2019-06-14 MED ORDER — LABETALOL HCL 5 MG/ML IV SOLN
10.0000 mg | INTRAVENOUS | Status: AC | PRN
  Administered 2019-06-14: 10 mg via INTRAVENOUS
  Filled 2019-06-14: qty 4

## 2019-06-14 MED ORDER — SODIUM CHLORIDE 0.9% FLUSH: 3.0000 mL | INTRAVENOUS | Status: DC | PRN

## 2019-06-14 MED ORDER — LEVOTHYROXINE SODIUM 88 MCG PO TABS
88.0000 ug | ORAL_TABLET | Freq: Every day | ORAL | Status: DC
  Administered 2019-06-15 – 2019-06-18 (×4): 88 ug via ORAL
  Filled 2019-06-14 (×4): qty 1

## 2019-06-14 MED ORDER — ACETAMINOPHEN 325 MG PO TABS: 650.0000 mg | ORAL_TABLET | ORAL | Status: DC | PRN

## 2019-06-14 MED ORDER — SODIUM CHLORIDE 0.9 % WEIGHT BASED INFUSION
3.0000 mL/kg/h | INTRAVENOUS | Status: DC
  Administered 2019-06-14: 3 mL/kg/h via INTRAVENOUS

## 2019-06-14 MED ORDER — SODIUM CHLORIDE 0.9% FLUSH
3.0000 mL | Freq: Two times a day (BID) | INTRAVENOUS | Status: DC
  Administered 2019-06-14 – 2019-06-17 (×7): 3 mL via INTRAVENOUS

## 2019-06-14 MED ORDER — LIDOCAINE HCL (PF) 1 % IJ SOLN
INTRAMUSCULAR | Status: DC | PRN
  Administered 2019-06-14: 2 mL

## 2019-06-14 MED ORDER — ONDANSETRON HCL 4 MG/2ML IJ SOLN: 4.0000 mg | Freq: Four times a day (QID) | INTRAMUSCULAR | Status: DC | PRN

## 2019-06-14 MED ORDER — VERAPAMIL HCL 2.5 MG/ML IV SOLN
INTRAVENOUS | Status: AC
  Filled 2019-06-14: qty 2

## 2019-06-14 MED ORDER — FENTANYL CITRATE (PF) 100 MCG/2ML IJ SOLN
INTRAMUSCULAR | Status: AC
  Filled 2019-06-14: qty 2

## 2019-06-14 MED ORDER — LISINOPRIL 20 MG PO TABS
20.0000 mg | ORAL_TABLET | Freq: Every day | ORAL | Status: DC
  Administered 2019-06-14 – 2019-06-17 (×4): 20 mg via ORAL
  Filled 2019-06-14 (×4): qty 1

## 2019-06-14 SURGICAL SUPPLY — 10 items
CATH 5FR JL3.5 JR4 ANG PIG MP (CATHETERS) ×2 IMPLANT
DEVICE RAD COMP TR BAND LRG (VASCULAR PRODUCTS) ×2 IMPLANT
GLIDESHEATH SLEND SS 6F .021 (SHEATH) ×2 IMPLANT
GUIDEWIRE INQWIRE 1.5J.035X260 (WIRE) ×1 IMPLANT
INQWIRE 1.5J .035X260CM (WIRE) ×2
KIT HEART LEFT (KITS) ×2 IMPLANT
PACK CARDIAC CATHETERIZATION (CUSTOM PROCEDURE TRAY) ×2 IMPLANT
SHEATH PROBE COVER 6X72 (BAG) ×2 IMPLANT
TRANSDUCER W/STOPCOCK (MISCELLANEOUS) ×2 IMPLANT
TUBING CIL FLEX 10 FLL-RA (TUBING) ×2 IMPLANT

## 2019-06-14 NOTE — Progress Notes (Signed)
    TCTS consultation placed per Levonne Spiller.    Kathyrn Drown NP-C [place] Pager: [phone]

## 2019-06-14 NOTE — Progress Notes (Signed)
  Echocardiogram 2D Echocardiogram has been performed.  Jennette Dubin 06/14/2019, 1:44 PM

## 2019-06-14 NOTE — Plan of Care (Signed)
  Problem: Education: Goal: Knowledge of General Education information will improve Description Including pain rating scale, medication(s)/side effects and non-pharmacologic comfort measures Outcome: Progressing   

## 2019-06-14 NOTE — Consult Note (Addendum)
[address]       [place], [address]             [phone]        Keeley W Ostrosky [place] Medical Record #[id number] Date of Birth: 1946-04-30  Referring: Dr Irish Lack Primary Care:Rory Percy, MD Primary Cardiologist:No primary care provider on file.  Chief Complaint: Syncope  History of Present Illness:      Ms. Stetzer is a 73 year old female with a past medical history significant for diverticulitis, hx of colonic polyps, previous tobacco abuse-quit in 1985, spondylolisthesis of the lumbar region, and hx of heme + stool who presented to Dr. Hassell Done office on 6/1 with previous syncope back in March while working in her yard. She was scheduled for a coronary CT at this time and encouraged to continue with daily exercise and to reduce her salt intake. The CT was performed and showed evidence of 3 vessel CAD. She was then scheduled for a cardiac catheterization. She underwent a cardiac catheterization today which showed 70% stenosis of the ostial to proximal circumflex, 75% stenosis of the proximal to mid circumflex, 70% stenosis of the ostial LAD to proximal LAD, 95% stenosis of the ostial RCA to proximal RCA, mid LAD stenosis of 80%, and 75% stenosis of the second diagonal lesion.  Left ventricular ejection fraction is 55 to 65%. She has not had an Echocardiogram recently. She has not had issues prior to March of this year. She gets around the house okay and is able to take care of herself. We are consulted for possible coronary revascularization.   She has a strong family history of CAD, including her brother who was on the phone during my interview with the patient.    Current Activity/ Functional Status: Patient was independent with mobility/ambulation, transfers, ADL's, IADL's.   Zubrod Score: At the time of surgery this patient's most appropriate activity status/level should be described as: []     0    Normal activity, no symptoms [x]     1     Restricted in physical strenuous activity but ambulatory, able to do out light work []     2    Ambulatory and capable of self care, unable to do work activities, up and about                 more than 50%  Of the time                            []     3    Only limited self care, in bed greater than 50% of waking hours []     4    Completely disabled, no self care, confined to bed or chair []     5    Moribund  Past Medical History:  Diagnosis Date  . Abnormal stress test   . Acute serous otitis media    LEFT EAR  . Arthritis   . Atopic eczema    DERMATITIS  . Cataracts, bilateral   . Colon adenomas   . Diverticulitis 11/24/2011  . DM (diabetes mellitus) ([facility])    patient denies, not on medications, diet controlled  . Headache   . Heme + stool 03/22/2017  . History of colonic polyps 03/22/2017  . HTN (hypertension)   . Hyperlipidemia   . Hypothyroidism   . Impacted cerumen of left ear   . Low back pain   .  Melena   . PONV (postoperative nausea and vomiting)   . Spondylolisthesis of lumbar region 09/06/2016  . Syncope     Past Surgical History:  Procedure Laterality Date  . BACK SURGERY  09/05/2016   L4 and L5   . COLONOSCOPY  [facility]   >5 YEARS AGO  . COLONOSCOPY  12/12/2011   Procedure: COLONOSCOPY;  Surgeon: Dorothyann Peng, MD;  [facility];  Service: Endoscopy;  Laterality: N/A;  9:00  . COLONOSCOPY N/A 04/14/2017   Procedure: COLONOSCOPY;  Surgeon: Danie Binder, MD;  Location: [facility];  Service: Endoscopy;  Laterality: N/A;  1030 - per office, pt can't come earlier   . cyst removed     under the left arm  . DILATION AND CURETTAGE OF UTERUS    . POLYPECTOMY  04/14/2017   Procedure: POLYPECTOMY;  Surgeon: Danie Binder, MD;  Location: [facility];  Service: Endoscopy;;  ascending, hepatic flexure, prox. transverse, sigmoid x3  . SALPINGOOPHORECTOMY     left  . TUBAL LIGATION    . tubual pregnancy      Social History   Tobacco Use  Smoking  Status Former Smoker  . Packs/day: 1.50  . Types: Cigarettes  . Quit date: 03/09/1984  . Years since quitting: 35.2  Smokeless Tobacco Never Used    Social History   Substance and Sexual Activity  Alcohol Use No     Allergies  Allergen Reactions  . Darvocet [Propoxyphene N-Acetaminophen] Nausea Only and Other (See Comments)  . Methionine   . Ultram [Tramadol Hcl] Nausea And Vomiting  . Latex Rash    Current Facility-Administered Medications  Medication Dose Route Frequency Provider Last Rate Last Dose  . 0.9 %  sodium chloride infusion   Intravenous Continuous Jettie Booze, MD 75 mL/hr at 06/14/19 0950    . 0.9 %  sodium chloride infusion  250 mL Intravenous PRN Jettie Booze, MD      . acetaminophen (TYLENOL) tablet 650 mg  650 mg Oral Q4H PRN Jettie Booze, MD      . hydrALAZINE (APRESOLINE) injection 10 mg  10 mg Intravenous Q20 Min PRN Jettie Booze, MD      . labetalol (NORMODYNE) injection 10 mg  10 mg Intravenous Q10 min PRN Jettie Booze, MD      . Derrill Memo ON 06/15/2019] levothyroxine (SYNTHROID) tablet 88 mcg  88 mcg Oral QAC breakfast Jettie Booze, MD      . lisinopril-hydrochlorothiazide (ZESTORETIC) 20-12.5 MG per tablet 1 tablet  1 tablet Oral Daily Jettie Booze, MD      . ondansetron [facility]) injection 4 mg  4 mg Intravenous Q6H PRN Larae Grooms S, MD      . sodium chloride flush (NS) 0.9 % injection 3 mL  3 mL Intravenous Q12H Jettie Booze, MD   3 mL at 06/14/19 1028  . sodium chloride flush (NS) 0.9 % injection 3 mL  3 mL Intravenous Q12H Larae Grooms S, MD      . sodium chloride flush (NS) 0.9 % injection 3 mL  3 mL Intravenous PRN Jettie Booze, MD        Medications Prior to Admission  Medication Sig Dispense Refill Last Dose  . acetaminophen (TYLENOL) 650 MG CR tablet Take 1,300 mg by mouth every 8 (eight) hours as needed for pain.    Past Month at Unknown time  . Cholecalciferol  (VITAMIN D3) 2000 units TABS Take  2,000 Units by mouth daily.   06/13/2019 at Unknown time  . levothyroxine (SYNTHROID, LEVOTHROID) 88 MCG tablet Take 88 mcg by mouth daily before breakfast.    06/14/2019 at 0530  . lisinopril-hydrochlorothiazide (PRINZIDE,ZESTORETIC) 20-12.5 MG per tablet Take 1 tablet by mouth daily.     06/13/2019 at Unknown time  . Misc Natural Products (TART CHERRY ADVANCED PO) Take 1 capsule by mouth daily.   06/13/2019 at Unknown time  . Multiple Vitamins-Minerals (MULTIVITAMIN PO) Take 1 tablet by mouth daily.   06/13/2019 at Unknown time  . naproxen sodium (ANAPROX) 220 MG tablet Take 440 mg by mouth every 8 (eight) hours as needed (for pain).    Past Month at Unknown time  . OVER THE COUNTER MEDICATION Take 1 capsule by mouth daily. Hem-Care   06/13/2019 at Unknown time  . pravastatin (PRAVACHOL) 40 MG tablet Take 40 mg by mouth daily.     06/13/2019 at Unknown time  . Probiotic Product (PROBIOTIC FORMULA PO) Take 1 capsule by mouth daily.     06/13/2019 at Unknown time  . TURMERIC CURCUMIN PO Take 1 capsule by mouth daily.   06/13/2019 at Unknown time  . hydrocortisone (PROCTOZONE-HC) 2.5 % rectal cream Place 1 application rectally 4 (four) times daily. UP TO 10 DAYS FOR RECTAL PAIN/BLEEDING 30 g 0 More than a month at Unknown time  . metoprolol tartrate (LOPRESSOR) 25 MG tablet Take 1 tablet 2 hours prior to Cardiac CT 1 tablet 0 Unknown at Unknown time    Family History  Problem Relation Age of Onset  . Heart attack Mother   . Hypertension Mother   . Thyroid disease Mother   . CAD Mother   . Diabetes Mellitus II Mother   . Kidney disease Brother   . Stroke Brother   . Heart attack Brother   . Hypertension Daughter   . Colon cancer Neg Hx   . Colon polyps Neg Hx      Review of Systems:   Review of Systems  Constitutional: Positive for malaise/fatigue.  Respiratory: Negative for cough, sputum production and shortness of breath.   Cardiovascular: Negative for  chest pain and leg swelling.  Neurological: Positive for dizziness (previous syncopal episode in March 2020).   Pertinent items are noted in HPI.       Physical Exam: BP (!) 150/84   Pulse 74   Temp 97.9 F (36.6 C) (Oral)   Resp 20   Ht 5' 3.5" (1.613 m)   Wt 82.2 kg   SpO2 100%   BMI 31.60 kg/m    General appearance: alert, cooperative and no distress Resp: clear to auscultation bilaterally Cardio: regular rate and rhythm, S1, S2 normal, no murmur, click, rub or gallop GI: soft, non-tender; bowel sounds normal; no masses,  no organomegaly Extremities: extremities normal, atraumatic, no cyanosis or edema Neurologic: Grossly normal Appears to have adequate vein for bypass lower extremities   Diagnostic Studies & Laboratory data:  Cardiac Catherization 6/19   Ost Cx to Prox Cx lesion is 70% stenosed.  Prox Cx to Mid Cx lesion is 75% stenosed.  Ost LAD to Prox LAD lesion is 70% stenosed.  Ost RCA to Prox RCA lesion is 95% stenosed.  Mid LAD lesion is 80% stenosed.  2nd Diag lesion is 75% stenosed.  The left ventricular systolic function is normal.  LV end diastolic pressure is normal.  The left ventricular ejection fraction is 55-65% by visual estimate.  There is no aortic valve stenosis.  Severe three vessel disease.    Plan for cardiac surgery consult.   Will keep as inpatient.  She is reporting frequent GERD sx, which could be cardiac.  She has left main equivalent disease with an ostial RCA lesion as well.        Recent Radiology Findings:  Ct Coronary Morph W/cta Cor W/score W/ca W/cm &/or Wo/cm  Addendum Date: 06/12/2019   ADDENDUM REPORT: 06/12/2019 10:42 HISTORY: abnormal stress test EXAM: Cardiac/Coronary  CT TECHNIQUE: The patient was scanned on a [employer]. PROTOCOL: A 120 kV prospective scan was triggered in the descending thoracic aorta at 111 HU's. Axial non-contrast 3 mm slices were carried out through the heart. The data set  was analyzed on a dedicated work station and scored using the Sanders. Gantry rotation speed was 250 msecs and collimation was .6 mm. Beta blockade and 0.8 mg of sl NTG was given. The 3D data set was reconstructed in 5% intervals of the 67-82 % of the R-R cycle. Diastolic phases were analyzed on a dedicated work station using MPR, MIP and VRT modes. The patient received 14mL OMNIPAQUE IOHEXOL 350 MG/ML SOLN of contrast. FINDINGS: Coronary calcium score: The patient's coronary artery calcium score is 1712, which places the patient in the 99th percentile. Coronary arteries: Normal coronary origins.  Right dominance. Right Coronary Artery: Severe ostial and proximal calcified plaque, severe 70-99% stenosis. Mid and distal vessel are contrast opacified, suggesting patency, with diffuse disease. Left Main Coronary Artery: Mild mixed atherosclerotic plaque in the proximal LM, 25-49% stenosis. Left Anterior Descending Coronary Artery: Moderate calcified plaque at the LAD ostium, 50-69% stenosis. Immediately distal to the ostium there is a probable severe mixed atherosclerotic plaque 70-99% stenosis in the proximal LAD. Also in the proximal LAD, just prior to the takeoff of the first septal perforator, there is a severe mixed atherosclerotic plaque 70-99% stenosis. In the mid LAD, around the takeoff of the first diagonal branch, there is a long, irregular severe lesion, 70-99% stenosis. The distal vessel is diffusely diseased, with one probable severe mixed atherosclerotic plaque 70-99% stenosis. Left Circumflex Artery: Severe stenosis in the proximal circumflex, 70-99% stenosis. There is a second, severe atherosclerotic plaque in the proximal circumflex, 70-99% stenosis. A third severe, heavily calcified mixed atherosclerotic plaque is seen in the proximal circumflex artery 70-99% stenosis. OM appears patent and distal circumflex is diffusely diseased. Aorta: Normal size, 29 mm at the mid ascending aorta (level of  the PA bifurcation) measured double oblique. Moderate-severe mixed atherosclerotic plaque in the ascending aorta. No dissection. Aortic Valve: No calcifications. Other findings: Normal pulmonary vein drainage into the left atrium. Normal left atrial appendage without a thrombus. Normal size of the pulmonary artery. Trivial anterior pericardial effusion. Mild-moderate biatrial chamber enlargement. Mild basal septal asymmetric hypertrophy. IMPRESSION: 1. Severe CAD, CADRADS = 4. Severe, proximal and ostial three vessel stenosis, 70-99% stenosis. CT FFR analysis will be performed. Consider coronary angiography. 2. The patient's coronary artery calcium score is 1712, which places the patient in the 99th percentile for age and sex matched control. 3. Normal coronary origin with right dominance. Electronically Signed   By: Cherlynn Kaiser   On: 06/12/2019 10:42   Result Date: 06/12/2019 EXAM: OVER-READ INTERPRETATION  CT CHEST The following report is an over-read performed by radiologist Dr. Suzy Bouchard of [facility], [place] on 06/11/2019. This over-read does not include interpretation of cardiac or coronary anatomy or pathology. The coronary CTA interpretation by the cardiologist is attached. COMPARISON:  None  FINDINGS: Limited view of the lung parenchyma demonstrates no suspicious nodularity. Airways are normal. Limited view of the mediastinum demonstrates no adenopathy. Esophagus normal. Limited view of the upper abdomen unremarkable. Limited view of the skeleton and chest wall is unremarkable. IMPRESSION: No significant extracardiac findings. Electronically Signed: By: Suzy Bouchard M.D. On: 06/11/2019 09:46   Ct Coronary Fractional Flow Reserve Fluid Analysis  Result Date: 06/12/2019 EXAM: CT FFR ANALYSIS CLINICAL DATA:  abnormal stress test FINDINGS: FFRct analysis was performed on the original cardiac CT angiogram dataset. Diagrammatic representation of the FFRct analysis is provided in a  separate PDF document in PACS. This dictation was created using the PDF document and an interactive 3D model of the results. 3D model is not available in the EMR/PACS. Normal FFR range is >0.80. 1. Left Main:  No significant stenosis. FFR = 0.97 2. LAD: Proximal FFR = 0.87, Mid FFR = 0.65, Distal FFR = < 0.50 3. LCX: Proximal FFR = 0.71, Distal FFR = <0.50 4. RCA: Proximal FFR = 0.61, Mid FFR = 0.58, Distal FFR = could not be mapped IMPRESSION: 1. CT FFR analysis showed hemodynamically significant lesions in the ostial RCA, mid LAD, and proximal circumflex. Referring physician informed of findings. Consider coronary angiography for severe multivessel disease. Electronically Signed   By: Cherlynn Kaiser   On: 06/12/2019 16:42    I have independently reviewed the above radiologic studies and discussed with the patient   Recent Lab Findings: Lab Results  Component Value Date   WBC 5.9 06/14/2019   HGB 13.0 06/14/2019   HCT 41.8 06/14/2019   PLT 154 06/14/2019   GLUCOSE 94 06/04/2019   NA 143 06/04/2019   K 4.4 06/04/2019   CL 102 06/04/2019   CREATININE 0.78 06/04/2019   BUN 10 06/04/2019   CO2 25 06/04/2019   Echocardiogram: ECHOCARDIOGRAM REPORT       Patient Name:   KRISTEN FROMM Date of Exam: 06/14/2019 Medical Rec #:  [id number]     Height:       63.5 in Accession #:    [id number]    Weight:       181.2 lb Date of Birth:  1946/01/09    BSA:          1.87 m Patient Age:    63 years      BP:           153/71 mmHg Patient Gender: F             HR:           85 bpm. Exam Location:  Inpatient    Procedure: 2D Echo  Indications:    CAD, Preop testing.   History:        Patient has no prior history of Echocardiogram examinations.                 Signs/Symptoms: Syncope Risk Factors: Hypertension, Dyslipidemia                 and Diabetes.   Sonographer:    Mikki Santee RDCS (AE) Referring Phys: [id number] Kenshin Splawn B Maciel Kegg  IMPRESSIONS    1. The left ventricle has  normal systolic function with an ejection fraction of 60-65%. The cavity size was normal. Left ventricular diastolic Doppler parameters are indeterminate. No evidence of left ventricular regional wall motion abnormalities.  2. The right ventricle has normal systolic function. The cavity was normal. There is no increase in right ventricular wall thickness. Right  ventricular systolic pressure is normal.  3. Mild calcification of the mitral valve leaflet. There is mild mitral annular calcification present. No evidence of mitral valve stenosis.  4. The aortic valve is tricuspid. No stenosis of the aortic valve. Moderate aortic annular calcification noted.  FINDINGS  Left Ventricle: The left ventricle has normal systolic function, with an ejection fraction of 60-65%. The cavity size was normal. There is no increase in left ventricular wall thickness. Left ventricular diastolic Doppler parameters are indeterminate.  No evidence of left ventricular regional wall motion abnormalities..  Right Ventricle: The right ventricle has normal systolic function. The cavity was normal. There is no increase in right ventricular wall thickness. Right ventricular systolic pressure is normal.  Left Atrium: Left atrial size was normal in size.  Right Atrium: Right atrial size was normal in size. Right atrial pressure is estimated at 10 mmHg.  Interatrial Septum: No atrial level shunt detected by color flow Doppler.  Pericardium: There is no evidence of pericardial effusion.  Mitral Valve: The mitral valve is normal in structure. Mild calcification of the mitral valve leaflet. There is mild mitral annular calcification present. Mitral valve regurgitation is trivial by color flow Doppler. No evidence of mitral valve stenosis.  Tricuspid Valve: The tricuspid valve is normal in structure. Tricuspid valve regurgitation was not visualized by color flow Doppler.  Aortic Valve: The aortic valve is tricuspid Aortic  valve regurgitation was not visualized by color flow Doppler. There is No stenosis of the aortic valve. Moderate aortic annular calcification noted.  Pulmonic Valve: The pulmonic valve was grossly normal. Pulmonic valve regurgitation is trivial by color flow Doppler.  Pulmonary Artery: The pulmonary artery is not well seen.  Venous: The inferior vena cava is normal in size with greater than 50% respiratory variability.    +--------------+--------++ LEFT VENTRICLE         +----------------+---------++ +--------------+--------++ Diastology                PLAX 2D                +----------------+---------++ +--------------+--------++ LV e' lateral:  9.14 cm/s LVIDd:        4.40 cm  +----------------+---------++ +--------------+--------++ LV E/e' lateral:8.4       LVIDs:        2.60 cm  +----------------+---------++ +--------------+--------++ LV e' medial:   6.96 cm/s LV PW:        1.00 cm  +----------------+---------++ +--------------+--------++ LV E/e' medial: 11.1      LV IVS:       1.00 cm  +----------------+---------++ +--------------+--------++ LVOT diam:    2.30 cm  +--------------+--------++ LV SV:        63 ml    +--------------+--------++ LV SV Index:  32.36    +--------------+--------++ LVOT Area:    4.15 cm +--------------+--------++                        +--------------+--------++  +---------------+----------++ RIGHT VENTRICLE           +---------------+----------++ RV S prime:    12.10 cm/s +---------------+----------++ TAPSE (M-mode):1.8 cm     +---------------+----------++  +-------------+-------++-----------++ LEFT ATRIUM         Index       +-------------+-------++-----------++ LA diam:     3.20 cm1.72 cm/m  +-------------+-------++-----------++ LA Vol (A2C):26.5 ml14.21 ml/m +-------------+-------++-----------++ LA Vol (A4C):30.6 ml16.41  ml/m +-------------+-------++-----------++ +------------+---------++----------++ RIGHT ATRIUM         Index      +------------+---------++----------++  RA Area:    10.60 cm           +------------+---------++----------++ RA Volume:  17.70 ml 9.49 ml/m +------------+---------++----------++  +------------+-----------++ AORTIC VALVE            +------------+-----------++ LVOT Vmax:  85.80 cm/s  +------------+-----------++ LVOT Vmean: 62.800 cm/s +------------+-----------++ LVOT VTI:   0.211 m     +------------+-----------++   +-------------+-------++ AORTA                +-------------+-------++ Ao Root diam:3.00 cm +-------------+-------++  +--------------+----------++ MITRAL VALVE              +--------------+-------+ +--------------+----------++  SHUNTS                MV Area (PHT):2.83 cm    +--------------+-------+ +--------------+----------++  Systemic VTI: 0.21 m  MV PHT:       77.72 msec  +--------------+-------+ +--------------+----------++  Systemic Diam:2.30 cm MV Decel Time:268 msec    +--------------+-------+ +--------------+----------++ +--------------+-----------++ MV E velocity:77.10 cm/s  +--------------+-----------++ MV A velocity:106.00 cm/s +--------------+-----------++ MV E/A ratio: 0.73        +--------------+-----------++    Buford Dresser MD Electronically signed by Buford Dresser MD Signature Date/Time: 06/14/2019/2:13:24 PM      Assessment / Plan:      1. Multivessel CAD- Continue asa and statin therapy. Currently no chest pain.  2. Diverticulitis-no current issues.  3. Hypothyroidism- continue home dose of levothyroxine 4. Hypertension- on lisinopril-HCTZ at home. Trying to hold ACEI in the periop period. Continue hydralazine IV PRN and Labetalol IV.  5.  Heme + stool- The patients states this was years ago where she was having colonic  polyps.    Plan: On the OR schedule for Tuesday at 7:30am.   Patient has been seen examined , cath films reviewed with patient and daughter (over face chat).  With "borderline " DM and severe 3 vessel disease I agree with cardiology recommendation to proceed with CABG. Risks and options and expectations of procedure including recovery time discussed.   Grace Isaac MD      [address] [place] [address] Office [phone]   [place]

## 2019-06-14 NOTE — Interval H&P Note (Signed)
Cath Lab Visit (complete for each Cath Lab visit)  Clinical Evaluation Leading to the Procedure:   ACS: No.  Non-ACS:    Anginal Classification: CCS III  Anti-ischemic medical therapy: Minimal Therapy (1 class of medications)  Non-Invasive Test Results: High-risk stress test findings: cardiac mortality >3%/year  Prior CABG: No previous CABG   3 vessel disease on coronary CT scan   History and Physical Interval Note:  06/14/2019 8:52 AM  Kathleen Terrell  has presented today for surgery, with the diagnosis of abnormal stress test.  The various methods of treatment have been discussed with the patient and family. After consideration of risks, benefits and other options for treatment, the patient has consented to  Procedure(s): LEFT HEART CATH AND CORONARY ANGIOGRAPHY (N/A) as a surgical intervention.  The patient's history has been reviewed, patient examined, no change in status, stable for surgery.  I have reviewed the patient's chart and labs.  Questions were answered to the patient's satisfaction.     Larae Grooms

## 2019-06-15 ENCOUNTER — Other Ambulatory Visit (HOSPITAL_COMMUNITY): Payer: Medicare Other

## 2019-06-15 DIAGNOSIS — I2510 Atherosclerotic heart disease of native coronary artery without angina pectoris: Principal | ICD-10-CM

## 2019-06-15 MED ORDER — ATORVASTATIN CALCIUM 80 MG PO TABS
80.0000 mg | ORAL_TABLET | Freq: Every day | ORAL | Status: DC
  Administered 2019-06-15 – 2019-06-17 (×3): 80 mg via ORAL
  Filled 2019-06-15 (×3): qty 1

## 2019-06-15 NOTE — Progress Notes (Signed)
   Progress Note  Patient Name: Kathleen Terrell Date of Encounter: 06/15/2019  Primary Cardiologist: No primary care provider on file.   Subjective   "denies chest pain or sob."  Inpatient Medications    Scheduled Meds: . hydrochlorothiazide  12.5 mg Oral Daily  . levothyroxine  88 mcg Oral QAC breakfast  . lisinopril  20 mg Oral Daily  . sodium chloride flush  3 mL Intravenous Q12H  . sodium chloride flush  3 mL Intravenous Q12H   Continuous Infusions: . sodium chloride     PRN Meds: sodium chloride, acetaminophen, ondansetron (ZOFRAN) IV, sodium chloride flush   Vital Signs    Vitals:   06/15/19 0452 06/15/19 0453 06/15/19 0806 06/15/19 1144  BP: (!) 152/62  (!) 143/71 (!) 153/74  Pulse: 65  85 72  Resp: 18  18 18   Temp: 98.9 F (37.2 C)  98.9 F (37.2 C) 99 F (37.2 C)  TempSrc: Oral  Oral Oral  SpO2: 99%  100% 100%  Weight:  81.9 kg    Height:        Intake/Output Summary (Last 24 hours) at 06/15/2019 1249 Last data filed at 06/15/2019 1145 Gross per 24 hour  Intake 812.5 ml  Output 1400 ml  Net -587.5 ml   Filed Weights   06/14/19 0654 06/14/19 1015 06/15/19 0453  Weight: 82.1 kg 82.2 kg 81.9 kg    Telemetry    nsr - Personally Reviewed  ECG    none - Personally Reviewed  Physical Exam   GEN: No acute distress.   Neck: 6 cm JVD Cardiac: RRR, no murmurs, rubs, or gallops.  Respiratory: Clear to auscultation bilaterally. GI: Soft, nontender, non-distended  MS: No edema; No deformity. Neuro:  Nonfocal  Psych: Normal affect   Labs    ChemistryNo results for input(s): NA, K, CL, CO2, GLUCOSE, BUN, CREATININE, CALCIUM, PROT, ALBUMIN, AST, ALT, ALKPHOS, BILITOT, GFRNONAA, GFRAA, ANIONGAP in the last 168 hours.   Hematology Recent Labs  Lab 06/14/19 0720  WBC 5.9  RBC 4.82  HGB 13.0  HCT 41.8  MCV 86.7  MCH 27.0  MCHC 31.1  RDW 14.8  PLT 154    Cardiac EnzymesNo results for input(s): TROPONINI in the last 168 hours. No results for  input(s): TROPIPOC in the last 168 hours.   BNPNo results for input(s): BNP, PROBNP in the last 168 hours.   DDimer No results for input(s): DDIMER in the last 168 hours.   Radiology    No results found.  Cardiac Studies   none  Patient Profile     73 y.o. female admitted for left heart cath found to have 3 vessel disease.  Assessment & Plan    1. 3 Vessel CAD - she is asymptomatic at rest. She is pending CABG on Tuesday. 2. HTN - her pressure is elevated. She will need bp lowering as an outpatient. 3. Dyslipidemia - she will need aggessive lipid lowering.    For questions or updates, please contact [place] Please consult [email or web address] for contact info under Cardiology/STEMI.      Signed, Cristopher Peru, MD  06/15/2019, 12:49 PM  Patient ID: [name], female   DOB: 1946/04/05, 73 y.o.   MRN: [id number]

## 2019-06-16 ENCOUNTER — Other Ambulatory Visit (HOSPITAL_COMMUNITY): Payer: Medicare Other

## 2019-06-16 ENCOUNTER — Encounter (HOSPITAL_COMMUNITY): Payer: Self-pay | Admitting: Cardiology

## 2019-06-16 NOTE — Progress Notes (Signed)
   Progress Note  Patient Name: Kathleen Terrell Date of Encounter: 06/16/2019  Primary Cardiologist: No primary care provider on file.   Subjective   No chest pain or sob.   Inpatient Medications    Scheduled Meds: . atorvastatin  80 mg Oral q1800  . hydrochlorothiazide  12.5 mg Oral Daily  . levothyroxine  88 mcg Oral QAC breakfast  . lisinopril  20 mg Oral Daily  . sodium chloride flush  3 mL Intravenous Q12H  . sodium chloride flush  3 mL Intravenous Q12H   Continuous Infusions: . sodium chloride     PRN Meds: sodium chloride, acetaminophen, ondansetron (ZOFRAN) IV, sodium chloride flush   Vital Signs    Vitals:   06/15/19 1704 06/15/19 2011 06/16/19 0455 06/16/19 0456  BP: 106/64 (!) 154/67 (!) 147/75   Pulse: 68 71 78   Resp:  18 18   Temp:  98.2 F (36.8 C) 98 F (36.7 C)   TempSrc:  Oral Oral   SpO2:  100% 97%   Weight:    81.1 kg  Height:        Intake/Output Summary (Last 24 hours) at 06/16/2019 1115 Last data filed at 06/16/2019 0930 Gross per 24 hour  Intake 483 ml  Output 2150 ml  Net -1667 ml   Filed Weights   06/14/19 1015 06/15/19 0453 06/16/19 0456  Weight: 82.2 kg 81.9 kg 81.1 kg    Telemetry    NSR - Personally Reviewed  ECG    none - Personally Reviewed  Physical Exam   GEN: No acute distress.   Neck: No JVD Cardiac: RRR, no murmurs, rubs, or gallops.  Respiratory: Clear to auscultation bilaterally. GI: Soft, nontender, non-distended  MS: No edema; No deformity. Neuro:  Nonfocal  Psych: Normal affect   Labs    ChemistryNo results for input(s): NA, K, CL, CO2, GLUCOSE, BUN, CREATININE, CALCIUM, PROT, ALBUMIN, AST, ALT, ALKPHOS, BILITOT, GFRNONAA, GFRAA, ANIONGAP in the last 168 hours.   Hematology Recent Labs  Lab 06/14/19 0720  WBC 5.9  RBC 4.82  HGB 13.0  HCT 41.8  MCV 86.7  MCH 27.0  MCHC 31.1  RDW 14.8  PLT 154    Cardiac EnzymesNo results for input(s): TROPONINI in the last 168 hours. No results for  input(s): TROPIPOC in the last 168 hours.   BNPNo results for input(s): BNP, PROBNP in the last 168 hours.   DDimer No results for input(s): DDIMER in the last 168 hours.   Radiology    No results found.  Cardiac Studies   none  Patient Profile     73 y.o. female admitted for heart cath and found to have 3 vessel disease.  Assessment & Plan    1. 3 Vessel CAD - for CABG on Tuesday. 2. HTN - her bp is a little better today. We will follow.  For questions or updates, please contact [place] Please consult [email or web address] for contact info under Cardiology/STEMI.      Signed, Cristopher Peru, MD  06/16/2019, 11:15 AM  Patient ID: Kathleen Terrell, female   DOB: 01/06/1946, 73 y.o.   MRN: [id number]

## 2019-06-17 ENCOUNTER — Inpatient Hospital Stay (HOSPITAL_COMMUNITY): Payer: Medicare Other

## 2019-06-17 DIAGNOSIS — Z01810 Encounter for preprocedural cardiovascular examination: Secondary | ICD-10-CM

## 2019-06-17 DIAGNOSIS — I25118 Atherosclerotic heart disease of native coronary artery with other forms of angina pectoris: Secondary | ICD-10-CM

## 2019-06-17 LAB — BLOOD GAS, ARTERIAL
Acid-Base Excess: 3 mmol/L — ABNORMAL HIGH (ref 0.0–2.0)
Bicarbonate: 26.8 mmol/L (ref 20.0–28.0)
O2 Saturation: 97 %
Patient temperature: 98.6
pCO2 arterial: 39.9 mmHg (ref 32.0–48.0)
pH, Arterial: 7.443 (ref 7.350–7.450)
pO2, Arterial: 88.5 mmHg (ref 83.0–108.0)

## 2019-06-17 LAB — URINALYSIS, ROUTINE W REFLEX MICROSCOPIC
Bilirubin Urine: NEGATIVE
Glucose, UA: NEGATIVE mg/dL
Hgb urine dipstick: NEGATIVE
Ketones, ur: NEGATIVE mg/dL
Nitrite: NEGATIVE
Protein, ur: NEGATIVE mg/dL
Specific Gravity, Urine: 1.006 (ref 1.005–1.030)
pH: 6 (ref 5.0–8.0)

## 2019-06-17 LAB — TYPE AND SCREEN
ABO/RH(D): O POS
Antibody Screen: NEGATIVE

## 2019-06-17 LAB — SURGICAL PCR SCREEN
MRSA, PCR: NEGATIVE
Staphylococcus aureus: NEGATIVE

## 2019-06-17 MED ORDER — SODIUM CHLORIDE 0.9 % IV SOLN
750.0000 mg | INTRAVENOUS | Status: DC
  Filled 2019-06-17: qty 750

## 2019-06-17 MED ORDER — MAGNESIUM SULFATE 50 % IJ SOLN
40.0000 meq | INTRAMUSCULAR | Status: DC
  Filled 2019-06-17: qty 9.85

## 2019-06-17 MED ORDER — SODIUM CHLORIDE 0.9 % IV SOLN
INTRAVENOUS | Status: DC
  Filled 2019-06-17: qty 30

## 2019-06-17 MED ORDER — METOPROLOL TARTRATE 12.5 MG HALF TABLET
12.5000 mg | ORAL_TABLET | Freq: Once | ORAL | Status: AC
  Administered 2019-06-18: 12.5 mg via ORAL
  Filled 2019-06-17: qty 1

## 2019-06-17 MED ORDER — CHLORHEXIDINE GLUCONATE 0.12 % MT SOLN
15.0000 mL | Freq: Once | OROMUCOSAL | Status: AC
  Administered 2019-06-18: 15 mL via OROMUCOSAL
  Filled 2019-06-17: qty 15

## 2019-06-17 MED ORDER — NOREPINEPHRINE 4 MG/250ML-% IV SOLN
0.0000 ug/min | INTRAVENOUS | Status: DC
  Filled 2019-06-17: qty 250

## 2019-06-17 MED ORDER — TRANEXAMIC ACID 1000 MG/10ML IV SOLN
1.5000 mg/kg/h | INTRAVENOUS | Status: AC
  Administered 2019-06-18: 1.5 mg/kg/h via INTRAVENOUS
  Filled 2019-06-17: qty 25

## 2019-06-17 MED ORDER — DEXMEDETOMIDINE HCL IN NACL 400 MCG/100ML IV SOLN
0.1000 ug/kg/h | INTRAVENOUS | Status: AC
  Administered 2019-06-18: 0.7 ug/kg/h via INTRAVENOUS
  Filled 2019-06-17: qty 100

## 2019-06-17 MED ORDER — MILRINONE LACTATE IN DEXTROSE 20-5 MG/100ML-% IV SOLN
0.3000 ug/kg/min | INTRAVENOUS | Status: DC
  Filled 2019-06-17: qty 100

## 2019-06-17 MED ORDER — TEMAZEPAM 15 MG PO CAPS
15.0000 mg | ORAL_CAPSULE | Freq: Once | ORAL | Status: AC | PRN
  Administered 2019-06-17: 15 mg via ORAL
  Filled 2019-06-17: qty 1

## 2019-06-17 MED ORDER — CHLORHEXIDINE GLUCONATE CLOTH 2 % EX PADS
6.0000 | MEDICATED_PAD | Freq: Once | CUTANEOUS | Status: AC
  Administered 2019-06-18: 6 via TOPICAL

## 2019-06-17 MED ORDER — TRANEXAMIC ACID (OHS) PUMP PRIME SOLUTION
2.0000 mg/kg | INTRAVENOUS | Status: DC
  Filled 2019-06-17: qty 1.62

## 2019-06-17 MED ORDER — MUPIROCIN 2 % EX OINT
1.0000 "application " | TOPICAL_OINTMENT | Freq: Two times a day (BID) | CUTANEOUS | Status: DC
  Administered 2019-06-17: 1 via NASAL
  Filled 2019-06-17: qty 22

## 2019-06-17 MED ORDER — PLASMA-LYTE 148 IV SOLN
INTRAVENOUS | Status: AC
  Administered 2019-06-18: 500 mL
  Filled 2019-06-17: qty 2.5

## 2019-06-17 MED ORDER — TRANEXAMIC ACID (OHS) BOLUS VIA INFUSION
15.0000 mg/kg | INTRAVENOUS | Status: AC
  Administered 2019-06-18: 1216.5 mg via INTRAVENOUS
  Filled 2019-06-17: qty 1217

## 2019-06-17 MED ORDER — INSULIN REGULAR(HUMAN) IN NACL 100-0.9 UT/100ML-% IV SOLN
INTRAVENOUS | Status: AC
  Administered 2019-06-18: .9 [IU]/h via INTRAVENOUS
  Filled 2019-06-17: qty 100

## 2019-06-17 MED ORDER — CHLORHEXIDINE GLUCONATE CLOTH 2 % EX PADS
6.0000 | MEDICATED_PAD | Freq: Once | CUTANEOUS | Status: AC
  Administered 2019-06-17: 6 via TOPICAL

## 2019-06-17 MED ORDER — DOPAMINE-DEXTROSE 3.2-5 MG/ML-% IV SOLN
0.0000 ug/kg/min | INTRAVENOUS | Status: DC
  Filled 2019-06-17: qty 250

## 2019-06-17 MED ORDER — BISACODYL 5 MG PO TBEC
5.0000 mg | DELAYED_RELEASE_TABLET | Freq: Once | ORAL | Status: AC
  Administered 2019-06-17: 5 mg via ORAL
  Filled 2019-06-17: qty 1

## 2019-06-17 MED ORDER — NITROGLYCERIN IN D5W 200-5 MCG/ML-% IV SOLN
2.0000 ug/min | INTRAVENOUS | Status: AC
  Administered 2019-06-18: 16.6 ug/min via INTRAVENOUS
  Filled 2019-06-17: qty 250

## 2019-06-17 MED ORDER — SODIUM CHLORIDE 0.9 % IV SOLN
1.5000 g | INTRAVENOUS | Status: AC
  Administered 2019-06-18: 1.5 g via INTRAVENOUS
  Filled 2019-06-17: qty 1.5

## 2019-06-17 MED ORDER — POTASSIUM CHLORIDE 2 MEQ/ML IV SOLN
80.0000 meq | INTRAVENOUS | Status: DC
  Filled 2019-06-17: qty 40

## 2019-06-17 MED ORDER — PHENYLEPHRINE HCL-NACL 20-0.9 MG/250ML-% IV SOLN
30.0000 ug/min | INTRAVENOUS | Status: AC
  Administered 2019-06-18: 10 ug/min via INTRAVENOUS
  Filled 2019-06-17: qty 250

## 2019-06-17 MED ORDER — VANCOMYCIN HCL 10 G IV SOLR
1250.0000 mg | INTRAVENOUS | Status: AC
  Administered 2019-06-18: 1250 mg via INTRAVENOUS
  Filled 2019-06-17: qty 1250

## 2019-06-17 MED ORDER — EPINEPHRINE PF 1 MG/ML IJ SOLN
0.0000 ug/min | INTRAVENOUS | Status: DC
  Filled 2019-06-17: qty 4

## 2019-06-17 NOTE — Progress Notes (Signed)
[address]       [place] [address]             [phone]                 3 Days Post-Op Procedure(s) (LRB): LEFT HEART CATH AND CORONARY ANGIOGRAPHY (N/A)  LOS: 3 days   Subjective: No chest pain today  Objective: Vital signs in last 24 hours: Patient Vitals for the past 24 hrs:  BP Temp Temp src Pulse Resp SpO2 Weight  06/17/19 1249 132/73 98.1 F (36.7 C) Oral 99 15 99 % -  06/17/19 0610 129/66 97.8 F (36.6 C) Oral 77 18 - 81.1 kg  06/16/19 2028 127/71 98.2 F (36.8 C) Oral 81 18 100 % -    Filed Weights   06/15/19 0453 06/16/19 0456 06/17/19 0610  Weight: 81.9 kg 81.1 kg 81.1 kg    Hemodynamic parameters for last 24 hours:    Intake/Output from previous day: 06/21 0701 - 06/22 0700 In: 923 [P.O.:920; I.V.:3] Out: 2700 [Urine:2700] Intake/Output this shift: Total I/O In: 580 [P.O.:580] Out: 800 [Urine:800]  Scheduled Meds: . atorvastatin  80 mg Oral q1800  . [START ON 06/18/2019] heparin-papaverine-plasmalyte irrigation   Irrigation To OR  . hydrochlorothiazide  12.5 mg Oral Daily  . [START ON 06/18/2019] insulin   Intravenous To OR  . levothyroxine  88 mcg Oral QAC breakfast  . lisinopril  20 mg Oral Daily  . [START ON 06/18/2019] magnesium sulfate  40 mEq Other To OR  . mupirocin ointment  1 application Nasal BID  . [START ON 06/18/2019] phenylephrine  30-200 mcg/min Intravenous To OR  . [START ON 06/18/2019] potassium chloride  80 mEq Other To OR  . sodium chloride flush  3 mL Intravenous Q12H  . sodium chloride flush  3 mL Intravenous Q12H  . [START ON 06/18/2019] tranexamic acid  15 mg/kg Intravenous To OR  . [START ON 06/18/2019] tranexamic acid  2 mg/kg Intracatheter To OR     PRN Meds:.sodium chloride, acetaminophen, ondansetron (ZOFRAN) IV, sodium chloride flush  General appearance: alert and cooperative Neurologic: intact Heart: regular rate and rhythm, S1, S2 normal, no murmur, click, rub or gallop Lungs: clear to  auscultation bilaterally Abdomen: soft, non-tender; bowel sounds normal; no masses,  no organomegaly Extremities: extremities normal, atraumatic, no cyanosis or edema and Homans sign is negative, no sign of DVT Wound: right radial cath site intact  Lab Results: CBC:No results for input(s): WBC, HGB, HCT, PLT in the last 72 hours. BMET: No results for input(s): NA, K, CL, CO2, GLUCOSE, BUN, CREATININE, CALCIUM in the last 72 hours.  PT/INR: No results for input(s): LABPROT, INR in the last 72 hours.   Radiology Vas US Doppler Pre Cabg  Result Date: 06/17/2019 PREOPERATIVE VASCULAR EVALUATION  Risk Factors:     Hypertension, hyperlipidemia, Diabetes, coronary artery                   disease. Comparison Study: No prior study on file for comparison. Performing Technologist: Sharion Dove RVS  Examination Guidelines: A complete evaluation includes B-mode imaging, spectral Doppler, color Doppler, and power Doppler as needed of all accessible portions of each vessel. Bilateral testing is considered an integral part of a complete examination. Limited examinations for reoccurring indications may be performed as noted.  Right Carotid Findings: +----------+--------+--------+--------+--------+------------------+           PSV cm/sEDV cm/sStenosisDescribeComments           +----------+--------+--------+--------+--------+------------------+  CCA Prox  113     16                      intimal thickening +----------+--------+--------+--------+--------+------------------+ CCA Distal80      17                      intimal thickening +----------+--------+--------+--------+--------+------------------+ ICA Prox  91      22              calcific                   +----------+--------+--------+--------+--------+------------------+ ICA Distal106     27                                         +----------+--------+--------+--------+--------+------------------+ ECA       61      9                                           +----------+--------+--------+--------+--------+------------------+ Portions of this table do not appear on this page. +----------+--------+-------+--------+------------+           PSV cm/sEDV cmsDescribeArm Pressure +----------+--------+-------+--------+------------+ Subclavian71                     140          +----------+--------+-------+--------+------------+ +---------+--------+--+--------+--+ VertebralPSV cm/s54EDV cm/s11 +---------+--------+--+--------+--+ Left Carotid Findings: +----------+--------+--------+--------+------------+------------------+           PSV cm/sEDV cm/sStenosisDescribe    Comments           +----------+--------+--------+--------+------------+------------------+ CCA Prox  94      18                          intimal thickening +----------+--------+--------+--------+------------+------------------+ CCA Distal97      18                          intimal thickening +----------+--------+--------+--------+------------+------------------+ ICA Prox  101     32              heterogenous                   +----------+--------+--------+--------+------------+------------------+ ICA Distal92      18                                             +----------+--------+--------+--------+------------+------------------+ ECA       78      7                                              +----------+--------+--------+--------+------------+------------------+ +----------+--------+--------+--------+------------+ SubclavianPSV cm/sEDV cm/sDescribeArm Pressure +----------+--------+--------+--------+------------+           44                      141          +----------+--------+--------+--------+------------+ +---------+--------+--+--------+--+ VertebralPSV cm/s59EDV cm/s16 +---------+--------+--+--------+--+  ABI Findings: +--------+------------------+-----+---------+--------+ Right   Rt Pressure  (  mmHg)IndexWaveform Comment  +--------+------------------+-----+---------+--------+ Brachial140                    triphasic         +--------+------------------+-----+---------+--------+ PTA     142               1.01 triphasic         +--------+------------------+-----+---------+--------+ DP      159               1.13 triphasic         +--------+------------------+-----+---------+--------+ +--------+------------------+-----+---------+-------+ Left    Lt Pressure (mmHg)IndexWaveform Comment +--------+------------------+-----+---------+-------+ [id number]                    triphasic        +--------+------------------+-----+---------+-------+ PTA     139               0.99 triphasic        +--------+------------------+-----+---------+-------+ DP      140               0.99 triphasic        +--------+------------------+-----+---------+-------+  Right Doppler Findings: +-----------+--------+-----+---------+-----------------------------------------+ Site       PressureIndexDoppler  Comments                                  +-----------+--------+-----+---------+-----------------------------------------+ Brachial   140          triphasic                                          +-----------+--------+-----+---------+-----------------------------------------+ Radial                  triphasic                                          +-----------+--------+-----+---------+-----------------------------------------+ Ulnar                   triphasic                                          +-----------+--------+-----+---------+-----------------------------------------+ Palmar Arch                      Doppler signal obliterates with radial                                     compression and remains normal with ulnar                                  compression                                +-----------+--------+-----+---------+-----------------------------------------+  Left Doppler Findings: +-----------+--------+-----+---------+-----------------------------------------+ Site       PressureIndexDoppler  Comments                                  +-----------+--------+-----+---------+-----------------------------------------+  Brachial   141          triphasic                                          +-----------+--------+-----+---------+-----------------------------------------+ Radial                  triphasic                                          +-----------+--------+-----+---------+-----------------------------------------+ Ulnar                   triphasic                                          +-----------+--------+-----+---------+-----------------------------------------+ Palmar Arch                      Doppler signal reverses with radial                                        compression and diminishes >50% with                                       ulnar compression                         +-----------+--------+-----+---------+-----------------------------------------+  Summary: Right Carotid: Velocities in the right ICA are consistent with a 1-39% stenosis. Left Carotid: Velocities in the left ICA are consistent with a 1-39% stenosis. Vertebrals:  Bilateral vertebral arteries demonstrate antegrade flow. Subclavians: Normal flow hemodynamics were seen in bilateral subclavian              arteries. Right Upper Extremity: Doppler waveform obliterate with right radial compression. Doppler waveforms remain within normal limits with right ulnar compression. Left Upper Extremity: Doppler waveforms remain within normal limits with left radial compression. Doppler waveforms decrease <50% with left ulnar compression.     Preliminary      Assessment/Plan: S/P Procedure(s) (LRB): LEFT HEART CATH AND CORONARY ANGIOGRAPHY (N/A)  Plan CABG tomorrow  . The goals risks and alternatives of the planned surgical procedure CABG  have been discussed with the patient in detail. The risks of the procedure including death, infection, stroke, myocardial infarction, bleeding, blood transfusion have all been discussed specifically.  I have quoted [name] a 2% of perioperative mortality and a complication rate as high as 30 %. The patient's questions have been answered.Walsie Smeltz Vrba is willing  to proceed with the planned procedure.    Grace Isaac MD 06/17/2019 3:06 PM

## 2019-06-17 NOTE — Progress Notes (Signed)
VASCULAR LAB PRELIMINARY  PRELIMINARY  PRELIMINARY  PRELIMINARY  Pre CABG Dopplers completed.    Preliminary report:  See CV proc for preliminary results.   Kanasia Gayman, RVT 06/17/2019, 2:23 PM

## 2019-06-17 NOTE — Progress Notes (Signed)
Per the nurse's request, I completed an initial visit with the patient. I visited the patient's room and shared introductions. I shared words of encouragement and shared that the Chaplain is available for additional support as needed or requested.    06/17/19 1130  Clinical Encounter Type  Visited With Patient  Visit Type Spiritual support;Initial  Referral From Nurse  Consult/Referral To Chaplain  Spiritual Encounters  Spiritual Needs Prayer    Chaplain Dr Redgie Grayer

## 2019-06-17 NOTE — Care Management Important Message (Signed)
Important Message  Patient Details  Name: Kathleen Terrell MRN: [id number] Date of Birth: 07-Jun-1946   Medicare Important Message Given:  Yes     Shelda Altes 06/17/2019, 12:42 PM

## 2019-06-17 NOTE — Plan of Care (Signed)
  Problem: Clinical Measurements: Goal: Will remain free from infection Outcome: Completed/Met   Problem: Nutrition: Goal: Adequate nutrition will be maintained Outcome: Completed/Met   Problem: Elimination: Goal: Will not experience complications related to bowel motility Outcome: Completed/Met Goal: Will not experience complications related to urinary retention Outcome: Completed/Met   Problem: Pain Managment: Goal: General experience of comfort will improve Outcome: Completed/Met   Problem: Safety: Goal: Ability to remain free from injury will improve Outcome: Completed/Met   Problem: Skin Integrity: Goal: Risk for impaired skin integrity will decrease Outcome: Completed/Met

## 2019-06-17 NOTE — Progress Notes (Signed)
Inpatient Diabetes Program Recommendations  AACE/ADA: New Consensus Statement on Inpatient Glycemic Control (2015)  Target Ranges:  Prepandial:   less than 140 mg/dL      Peak postprandial:   less than 180 mg/dL (1-2 hours)      Critically ill patients:  140 - 180 mg/dL   Lab Results  Component Value Date   GLUCAP 92 06/14/2019    Review of Glycemic Control  Diabetes history: DM2 Outpatient Diabetes medications: None Current orders for Inpatient glycemic control: IV insulin drip to start prior to CABG tomorrow  Inpatient Diabetes Program Recommendations:   Noted patient has hx DM but not on medications. Please consider: -A1c to determine prehospital glycemic control -Glycemic control order set with Novolog correcton sensitive tid prior to starting on IV insulin drip.  Thank you, Nani Gasser. Glema Takaki, RN, MSN, CDE  Diabetes Coordinator Inpatient Glycemic Control Team Team Pager [phone] (8am-5pm) 06/17/2019 10:49 AM

## 2019-06-17 NOTE — Progress Notes (Signed)
Progress Note  Patient Name: Kathleen Terrell Date of Encounter: 06/17/2019  Primary Cardiologist: No primary care provider on file. Bettey Muraoka  Subjective   Some GERD sx but no chest tightness.   Inpatient Medications    Scheduled Meds: . atorvastatin  80 mg Oral q1800  . [START ON 06/18/2019] heparin-papaverine-plasmalyte irrigation   Irrigation To OR  . hydrochlorothiazide  12.5 mg Oral Daily  . [START ON 06/18/2019] insulin   Intravenous To OR  . levothyroxine  88 mcg Oral QAC breakfast  . lisinopril  20 mg Oral Daily  . [START ON 06/18/2019] magnesium sulfate  40 mEq Other To OR  . mupirocin ointment  1 application Nasal BID  . [START ON 06/18/2019] phenylephrine  30-200 mcg/min Intravenous To OR  . [START ON 06/18/2019] potassium chloride  80 mEq Other To OR  . sodium chloride flush  3 mL Intravenous Q12H  . sodium chloride flush  3 mL Intravenous Q12H  . [START ON 06/18/2019] tranexamic acid  15 mg/kg Intravenous To OR  . [START ON 06/18/2019] tranexamic acid  2 mg/kg Intracatheter To OR   Continuous Infusions: . sodium chloride    . [START ON 06/18/2019] cefUROXime (ZINACEF)  IV    . cefUROXime (ZINACEF)  IV    . [START ON 06/18/2019] dexmedetomidine    . [START ON 06/18/2019] DOPamine    . [START ON 06/18/2019] epinephrine    . [START ON 06/18/2019] heparin 30,000 units/NS 1000 mL solution for CELLSAVER    . [START ON 06/18/2019] milrinone    . [START ON 06/18/2019] nitroGLYCERIN    . [START ON 06/18/2019] norepinephrine    . [START ON 06/18/2019] tranexamic acid (CYKLOKAPRON) infusion ([facility])    . [START ON 06/18/2019] vancomycin     PRN Meds: sodium chloride, acetaminophen, ondansetron (ZOFRAN) IV, sodium chloride flush   Vital Signs    Vitals:   06/16/19 0456 06/16/19 1215 06/16/19 2028 06/17/19 0610  BP:  (!) 142/81 127/71 129/66  Pulse:  82 81 77  Resp:  16 18 18   Temp:  98.2 F (36.8 C) 98.2 F (36.8 C) 97.8 F (36.6 C)  TempSrc:  Oral Oral Oral  SpO2:  98% 100%    Weight: 81.1 kg   81.1 kg  Height:        Intake/Output Summary (Last 24 hours) at 06/17/2019 0935 Last data filed at 06/17/2019 0814 Gross per 24 hour  Intake 920 ml  Output 2400 ml  Net -1480 ml   Last 3 Weights 06/17/2019 06/16/2019 06/15/2019  Weight (lbs) 178 lb 11.2 oz 178 lb 11.2 oz 180 lb 8 oz  Weight (kg) 81.058 kg 81.058 kg 81.874 kg      Telemetry    NSR- Personally Reviewed  ECG      Physical Exam   GEN: No acute distress.   Neck: No JVD Cardiac: RRR, no murmurs, rubs, or gallops.  Respiratory: Clear to auscultation bilaterally. GI: Soft, nontender, non-distended  MS: No edema; No deformity. Neuro:  Nonfocal  Psych: Normal affect   Labs    ChemistryNo results for input(s): NA, K, CL, CO2, GLUCOSE, BUN, CREATININE, CALCIUM, PROT, ALBUMIN, AST, ALT, ALKPHOS, BILITOT, GFRNONAA, GFRAA, ANIONGAP in the last 168 hours.   Hematology Recent Labs  Lab 06/14/19 0720  WBC 5.9  RBC 4.82  HGB 13.0  HCT 41.8  MCV 86.7  MCH 27.0  MCHC 31.1  RDW 14.8  PLT 154    Cardiac EnzymesNo results for input(s): TROPONINI in the  last 168 hours. No results for input(s): TROPIPOC in the last 168 hours.   BNPNo results for input(s): BNP, PROBNP in the last 168 hours.   DDimer No results for input(s): DDIMER in the last 168 hours.   Radiology    No results found.  Cardiac Studies   3 vessel CAD.  Patient Profile     73 y.o. female with 3 vessel CAD.  Abnormal FFR in all three distributions by CT FFR.  Assessment & Plan    Plan for CABG in AM.   For questions or updates, please contact [facility] Please consult [email or web address] for contact info under        Signed, Larae Grooms, MD  06/17/2019, 9:35 AM

## 2019-06-17 NOTE — Progress Notes (Signed)
[id number] Discussed with pt the importance of sternal precautions and staying in the tube. Handout given. Gave pt IS and she demonstrated 600-750 ml correctly. Discussed importance of mobility and IS after surgery. Pt will have family available after discharge to assist with care. Gave pt [facility] booklet, care guide and wrote down how to view pre op video. Pt has been walking in room without difficulty. Will follow up after surgery. Pt voiced understanding of ed. Graylon Good RN BSN 06/17/2019 11:53 AM

## 2019-06-18 ENCOUNTER — Inpatient Hospital Stay (HOSPITAL_COMMUNITY): Payer: Medicare Other | Admitting: Certified Registered"

## 2019-06-18 ENCOUNTER — Encounter (HOSPITAL_COMMUNITY): Payer: Self-pay | Admitting: Certified Registered"

## 2019-06-18 ENCOUNTER — Inpatient Hospital Stay (HOSPITAL_COMMUNITY): Payer: Medicare Other

## 2019-06-18 ENCOUNTER — Inpatient Hospital Stay (HOSPITAL_COMMUNITY)
Admission: RE | Disposition: A | Discharge status: Home / Self Care | Payer: Self-pay | Source: Home / Self Care | Attending: Cardiothoracic Surgery

## 2019-06-18 DIAGNOSIS — Z951 Presence of aortocoronary bypass graft: Secondary | ICD-10-CM

## 2019-06-18 DIAGNOSIS — I2510 Atherosclerotic heart disease of native coronary artery without angina pectoris: Secondary | ICD-10-CM

## 2019-06-18 HISTORY — PX: TEE WITHOUT CARDIOVERSION: SHX5443

## 2019-06-18 HISTORY — PX: CORONARY ARTERY BYPASS GRAFT: SHX141

## 2019-06-18 LAB — POCT I-STAT 7, (LYTES, BLD GAS, ICA,H+H)
Acid-Base Excess: 5 mmol/L — ABNORMAL HIGH (ref 0.0–2.0)
Acid-Base Excess: 4 mmol/L — ABNORMAL HIGH (ref 0.0–2.0)
Acid-base deficit: 2 mmol/L (ref 0.0–2.0)
Acid-base deficit: 3 mmol/L — ABNORMAL HIGH (ref 0.0–2.0)
Acid-base deficit: 3 mmol/L — ABNORMAL HIGH (ref 0.0–2.0)
Bicarbonate: 29.8 mmol/L — ABNORMAL HIGH (ref 20.0–28.0)
Bicarbonate: 27.9 mmol/L (ref 20.0–28.0)
Bicarbonate: 23.6 mmol/L (ref 20.0–28.0)
Bicarbonate: 23.6 mmol/L (ref 20.0–28.0)
Bicarbonate: 22.9 mmol/L (ref 20.0–28.0)
Calcium, Ion: 0.98 mmol/L — ABNORMAL LOW (ref 1.15–1.40)
Calcium, Ion: 1.09 mmol/L — ABNORMAL LOW (ref 1.15–1.40)
Calcium, Ion: 1.14 mmol/L — ABNORMAL LOW (ref 1.15–1.40)
Calcium, Ion: 1.18 mmol/L (ref 1.15–1.40)
Calcium, Ion: 1.17 mmol/L (ref 1.15–1.40)
HCT: 25 % — ABNORMAL LOW (ref 36.0–46.0)
HCT: 25 % — ABNORMAL LOW (ref 36.0–46.0)
HCT: 27 % — ABNORMAL LOW (ref 36.0–46.0)
HCT: 29 % — ABNORMAL LOW (ref 36.0–46.0)
HCT: 27 % — ABNORMAL LOW (ref 36.0–46.0)
Hemoglobin: 8.5 g/dL — ABNORMAL LOW (ref 12.0–15.0)
Hemoglobin: 8.5 g/dL — ABNORMAL LOW (ref 12.0–15.0)
Hemoglobin: 9.2 g/dL — ABNORMAL LOW (ref 12.0–15.0)
Hemoglobin: 9.9 g/dL — ABNORMAL LOW (ref 12.0–15.0)
Hemoglobin: 9.2 g/dL — ABNORMAL LOW (ref 12.0–15.0)
O2 Saturation: 100 %
O2 Saturation: 100 %
O2 Saturation: 98 %
O2 Saturation: 97 %
O2 Saturation: 97 %
Patient temperature: 35.8
Patient temperature: 36.8
Patient temperature: 37.1
Potassium: 4.1 mmol/L (ref 3.5–5.1)
Potassium: 3.9 mmol/L (ref 3.5–5.1)
Potassium: 3.9 mmol/L (ref 3.5–5.1)
Potassium: 5 mmol/L (ref 3.5–5.1)
Potassium: 4.7 mmol/L (ref 3.5–5.1)
Sodium: 143 mmol/L (ref 135–145)
Sodium: 142 mmol/L (ref 135–145)
Sodium: 143 mmol/L (ref 135–145)
Sodium: 143 mmol/L (ref 135–145)
Sodium: 143 mmol/L (ref 135–145)
TCO2: 31 mmol/L (ref 22–32)
TCO2: 29 mmol/L (ref 22–32)
TCO2: 25 mmol/L (ref 22–32)
TCO2: 25 mmol/L (ref 22–32)
TCO2: 24 mmol/L (ref 22–32)
pCO2 arterial: 42.2 mmHg (ref 32.0–48.0)
pCO2 arterial: 36.3 mmHg (ref 32.0–48.0)
pCO2 arterial: 38.3 mmHg (ref 32.0–48.0)
pCO2 arterial: 45.4 mmHg (ref 32.0–48.0)
pCO2 arterial: 44.5 mmHg (ref 32.0–48.0)
pH, Arterial: 7.457 — ABNORMAL HIGH (ref 7.350–7.450)
pH, Arterial: 7.494 — ABNORMAL HIGH (ref 7.350–7.450)
pH, Arterial: 7.392 (ref 7.350–7.450)
pH, Arterial: 7.322 — ABNORMAL LOW (ref 7.350–7.450)
pH, Arterial: 7.32 — ABNORMAL LOW (ref 7.350–7.450)
pO2, Arterial: 385 mmHg — ABNORMAL HIGH (ref 83.0–108.0)
pO2, Arterial: 408 mmHg — ABNORMAL HIGH (ref 83.0–108.0)
pO2, Arterial: 108 mmHg (ref 83.0–108.0)
pO2, Arterial: 97 mmHg (ref 83.0–108.0)
pO2, Arterial: 104 mmHg (ref 83.0–108.0)

## 2019-06-18 LAB — GLUCOSE, CAPILLARY
Glucose-Capillary: 100 mg/dL — ABNORMAL HIGH (ref 70–99)
Glucose-Capillary: 121 mg/dL — ABNORMAL HIGH (ref 70–99)
Glucose-Capillary: 124 mg/dL — ABNORMAL HIGH (ref 70–99)
Glucose-Capillary: 130 mg/dL — ABNORMAL HIGH (ref 70–99)
Glucose-Capillary: 132 mg/dL — ABNORMAL HIGH (ref 70–99)
Glucose-Capillary: 138 mg/dL — ABNORMAL HIGH (ref 70–99)
Glucose-Capillary: 138 mg/dL — ABNORMAL HIGH (ref 70–99)
Glucose-Capillary: 145 mg/dL — ABNORMAL HIGH (ref 70–99)
Glucose-Capillary: 151 mg/dL — ABNORMAL HIGH (ref 70–99)

## 2019-06-18 LAB — POCT I-STAT 4, (NA,K, GLUC, HGB,HCT)
Glucose, Bld: 105 mg/dL — ABNORMAL HIGH (ref 70–99)
Glucose, Bld: 100 mg/dL — ABNORMAL HIGH (ref 70–99)
Glucose, Bld: 84 mg/dL (ref 70–99)
Glucose, Bld: 100 mg/dL — ABNORMAL HIGH (ref 70–99)
Glucose, Bld: 118 mg/dL — ABNORMAL HIGH (ref 70–99)
Glucose, Bld: 116 mg/dL — ABNORMAL HIGH (ref 70–99)
HCT: 34 % — ABNORMAL LOW (ref 36.0–46.0)
HCT: 32 % — ABNORMAL LOW (ref 36.0–46.0)
HCT: 28 % — ABNORMAL LOW (ref 36.0–46.0)
HCT: 25 % — ABNORMAL LOW (ref 36.0–46.0)
HCT: 26 % — ABNORMAL LOW (ref 36.0–46.0)
HCT: 28 % — ABNORMAL LOW (ref 36.0–46.0)
Hemoglobin: 11.6 g/dL — ABNORMAL LOW (ref 12.0–15.0)
Hemoglobin: 10.9 g/dL — ABNORMAL LOW (ref 12.0–15.0)
Hemoglobin: 9.5 g/dL — ABNORMAL LOW (ref 12.0–15.0)
Hemoglobin: 8.5 g/dL — ABNORMAL LOW (ref 12.0–15.0)
Hemoglobin: 8.8 g/dL — ABNORMAL LOW (ref 12.0–15.0)
Hemoglobin: 9.5 g/dL — ABNORMAL LOW (ref 12.0–15.0)
Potassium: 4 mmol/L (ref 3.5–5.1)
Potassium: 3.6 mmol/L (ref 3.5–5.1)
Potassium: 4 mmol/L (ref 3.5–5.1)
Potassium: 4.7 mmol/L (ref 3.5–5.1)
Potassium: 3.8 mmol/L (ref 3.5–5.1)
Potassium: 3.8 mmol/L (ref 3.5–5.1)
Sodium: 139 mmol/L (ref 135–145)
Sodium: 140 mmol/L (ref 135–145)
Sodium: 143 mmol/L (ref 135–145)
Sodium: 138 mmol/L (ref 135–145)
Sodium: 141 mmol/L (ref 135–145)
Sodium: 142 mmol/L (ref 135–145)

## 2019-06-18 LAB — CBC
HCT: 40 % (ref 36.0–46.0)
HCT: 28.3 % — ABNORMAL LOW (ref 36.0–46.0)
HCT: 27.7 % — ABNORMAL LOW (ref 36.0–46.0)
Hemoglobin: 12.9 g/dL (ref 12.0–15.0)
Hemoglobin: 9 g/dL — ABNORMAL LOW (ref 12.0–15.0)
Hemoglobin: 8.8 g/dL — ABNORMAL LOW (ref 12.0–15.0)
MCH: 27.6 pg (ref 26.0–34.0)
MCH: 27.1 pg (ref 26.0–34.0)
MCH: 27.4 pg (ref 26.0–34.0)
MCHC: 32.3 g/dL (ref 30.0–36.0)
MCHC: 31.8 g/dL (ref 30.0–36.0)
MCHC: 31.8 g/dL (ref 30.0–36.0)
MCV: 85.7 fL (ref 80.0–100.0)
MCV: 85.2 fL (ref 80.0–100.0)
MCV: 86.3 fL (ref 80.0–100.0)
Platelets: 219 10*3/uL (ref 150–400)
Platelets: 130 10*3/uL — ABNORMAL LOW (ref 150–400)
Platelets: 125 10*3/uL — ABNORMAL LOW (ref 150–400)
RBC: 4.67 MIL/uL (ref 3.87–5.11)
RBC: 3.32 MIL/uL — ABNORMAL LOW (ref 3.87–5.11)
RBC: 3.21 MIL/uL — ABNORMAL LOW (ref 3.87–5.11)
RDW: 15 % (ref 11.5–15.5)
RDW: 14.8 % (ref 11.5–15.5)
RDW: 15 % (ref 11.5–15.5)
WBC: 6.9 10*3/uL (ref 4.0–10.5)
WBC: 9.7 10*3/uL (ref 4.0–10.5)
WBC: 10.3 10*3/uL (ref 4.0–10.5)
nRBC: 0 % (ref 0.0–0.2)
nRBC: 0 % (ref 0.0–0.2)
nRBC: 0 % (ref 0.0–0.2)

## 2019-06-18 LAB — HEMOGLOBIN A1C
Hgb A1c MFr Bld: 6.5 % — ABNORMAL HIGH (ref 4.8–5.6)
Mean Plasma Glucose: 139.85 mg/dL

## 2019-06-18 LAB — POCT I-STAT, CHEM 8
BUN: 13 mg/dL (ref 8–23)
Calcium, Ion: 1.14 mmol/L — ABNORMAL LOW (ref 1.15–1.40)
Chloride: 108 mmol/L (ref 98–111)
Creatinine, Ser: 0.6 mg/dL (ref 0.44–1.00)
Glucose, Bld: 142 mg/dL — ABNORMAL HIGH (ref 70–99)
HCT: 26 % — ABNORMAL LOW (ref 36.0–46.0)
Hemoglobin: 8.8 g/dL — ABNORMAL LOW (ref 12.0–15.0)
Potassium: 4.5 mmol/L (ref 3.5–5.1)
Sodium: 143 mmol/L (ref 135–145)
TCO2: 25 mmol/L (ref 22–32)

## 2019-06-18 LAB — BASIC METABOLIC PANEL
Anion gap: 7 (ref 5–15)
BUN: 17 mg/dL (ref 8–23)
CO2: 27 mmol/L (ref 22–32)
Calcium: 9.4 mg/dL (ref 8.9–10.3)
Chloride: 103 mmol/L (ref 98–111)
Creatinine, Ser: 0.9 mg/dL (ref 0.44–1.00)
GFR calc Af Amer: >60 mL/min (ref >60)
GFR calc non Af Amer: >60 mL/min (ref >60)
Glucose, Bld: 114 mg/dL — ABNORMAL HIGH (ref 70–99)
Potassium: 3.8 mmol/L (ref 3.5–5.1)
Sodium: 137 mmol/L (ref 135–145)

## 2019-06-18 LAB — APTT
aPTT: 30 seconds (ref 24–36)
aPTT: 36 s (ref 24–36)

## 2019-06-18 LAB — PROTIME-INR
INR: 1 (ref 0.8–1.2)
INR: 1.6 — ABNORMAL HIGH (ref 0.8–1.2)
Prothrombin Time: 12.5 seconds (ref 11.4–15.2)
Prothrombin Time: 18.6 s — ABNORMAL HIGH (ref 11.4–15.2)

## 2019-06-18 LAB — HEMOGLOBIN AND HEMATOCRIT, BLOOD
HCT: 24.5 % — ABNORMAL LOW (ref 36.0–46.0)
Hemoglobin: 7.9 g/dL — ABNORMAL LOW (ref 12.0–15.0)

## 2019-06-18 LAB — CREATININE, SERUM
Creatinine, Ser: 0.83 mg/dL (ref 0.44–1.00)
GFR calc Af Amer: >60 mL/min (ref >60)
GFR calc non Af Amer: >60 mL/min (ref >60)

## 2019-06-18 LAB — PLATELET COUNT: Platelets: 145 10*3/uL — ABNORMAL LOW (ref 150–400)

## 2019-06-18 LAB — MAGNESIUM: Magnesium: 3 mg/dL — ABNORMAL HIGH (ref 1.7–2.4)

## 2019-06-18 SURGERY — CORONARY ARTERY BYPASS GRAFTING (CABG)
Anesthesia: General | Site: Chest

## 2019-06-18 MED ORDER — PROTAMINE SULFATE 10 MG/ML IV SOLN
INTRAVENOUS | Status: AC
  Filled 2019-06-18: qty 25

## 2019-06-18 MED ORDER — MIDAZOLAM HCL (PF) 10 MG/2ML IJ SOLN
INTRAMUSCULAR | Status: AC
  Filled 2019-06-18: qty 2

## 2019-06-18 MED ORDER — POTASSIUM CHLORIDE 10 MEQ/50ML IV SOLN
10.0000 meq | INTRAVENOUS | Status: AC
  Administered 2019-06-18 (×3): 10 meq via INTRAVENOUS

## 2019-06-18 MED ORDER — SODIUM CHLORIDE 0.9% FLUSH: 3.0000 mL | INTRAVENOUS | Status: DC | PRN

## 2019-06-18 MED ORDER — HEPARIN SODIUM (PORCINE) 1000 UNIT/ML IJ SOLN
INTRAMUSCULAR | Status: AC
  Filled 2019-06-18: qty 1

## 2019-06-18 MED ORDER — ACETAMINOPHEN 160 MG/5ML PO SOLN: 1000.0000 mg | Freq: Four times a day (QID) | ORAL | Status: DC

## 2019-06-18 MED ORDER — DOCUSATE SODIUM 100 MG PO CAPS
200.0000 mg | ORAL_CAPSULE | Freq: Every day | ORAL | Status: DC
  Administered 2019-06-19: 200 mg via ORAL
  Filled 2019-06-18: qty 2

## 2019-06-18 MED ORDER — CHLORHEXIDINE GLUCONATE 0.12 % MT SOLN
15.0000 mL | OROMUCOSAL | Status: AC
  Administered 2019-06-18: 15 mL via OROMUCOSAL

## 2019-06-18 MED ORDER — HEMOSTATIC AGENTS (NO CHARGE) OPTIME
TOPICAL | Status: DC | PRN
  Administered 2019-06-18 (×2): 1 via TOPICAL

## 2019-06-18 MED ORDER — ALBUMIN HUMAN 5 % IV SOLN
INTRAVENOUS | Status: DC | PRN
  Administered 2019-06-18 (×2): via INTRAVENOUS

## 2019-06-18 MED ORDER — FAMOTIDINE IN NACL 20-0.9 MG/50ML-% IV SOLN
20.0000 mg | Freq: Two times a day (BID) | INTRAVENOUS | Status: DC
  Administered 2019-06-18: 20 mg via INTRAVENOUS

## 2019-06-18 MED ORDER — CHLORHEXIDINE GLUCONATE CLOTH 2 % EX PADS
6.0000 | MEDICATED_PAD | Freq: Every day | CUTANEOUS | Status: DC
  Administered 2019-06-18 – 2019-06-19 (×2): 6 via TOPICAL

## 2019-06-18 MED ORDER — METOPROLOL TARTRATE 5 MG/5ML IV SOLN: 2.5000 mg | INTRAVENOUS | Status: DC | PRN

## 2019-06-18 MED ORDER — OXYCODONE HCL 5 MG PO TABS
5.0000 mg | ORAL_TABLET | ORAL | Status: DC | PRN
  Administered 2019-06-18 – 2019-06-19 (×2): 5 mg via ORAL
  Administered 2019-06-19: 10 mg via ORAL
  Administered 2019-06-19 (×4): 5 mg via ORAL
  Administered 2019-06-20: 10 mg via ORAL
  Filled 2019-06-18: qty 1
  Filled 2019-06-18: qty 2
  Filled 2019-06-18 (×2): qty 1
  Filled 2019-06-18: qty 2
  Filled 2019-06-18 (×3): qty 1

## 2019-06-18 MED ORDER — PANTOPRAZOLE SODIUM 40 MG PO TBEC: 40.0000 mg | DELAYED_RELEASE_TABLET | Freq: Every day | ORAL | Status: DC

## 2019-06-18 MED ORDER — ONDANSETRON HCL 4 MG/2ML IJ SOLN
4.0000 mg | Freq: Four times a day (QID) | INTRAMUSCULAR | Status: DC | PRN
  Administered 2019-06-18 – 2019-06-19 (×3): 4 mg via INTRAVENOUS
  Filled 2019-06-18 (×4): qty 2

## 2019-06-18 MED ORDER — INSULIN REGULAR BOLUS VIA INFUSION
0.0000 [IU] | Freq: Three times a day (TID) | INTRAVENOUS | Status: DC
  Filled 2019-06-18: qty 10

## 2019-06-18 MED ORDER — INSULIN REGULAR(HUMAN) IN NACL 100-0.9 UT/100ML-% IV SOLN
INTRAVENOUS | Status: DC
  Administered 2019-06-18: 0.6 [IU]/h via INTRAVENOUS

## 2019-06-18 MED ORDER — 0.9 % SODIUM CHLORIDE (POUR BTL) OPTIME
TOPICAL | Status: DC | PRN
  Administered 2019-06-18: 5000 mL

## 2019-06-18 MED ORDER — ACETAMINOPHEN 650 MG RE SUPP: 650.0000 mg | Freq: Once | RECTAL | Status: AC

## 2019-06-18 MED ORDER — METOPROLOL TARTRATE 12.5 MG HALF TABLET
12.5000 mg | ORAL_TABLET | Freq: Two times a day (BID) | ORAL | Status: DC
  Administered 2019-06-19: 12.5 mg via ORAL
  Filled 2019-06-18: qty 1

## 2019-06-18 MED ORDER — PROTAMINE SULFATE 10 MG/ML IV SOLN
INTRAVENOUS | Status: AC
  Filled 2019-06-18: qty 5

## 2019-06-18 MED ORDER — ORAL CARE MOUTH RINSE
15.0000 mL | Freq: Two times a day (BID) | OROMUCOSAL | Status: DC
  Administered 2019-06-18: 15 mL via OROMUCOSAL

## 2019-06-18 MED ORDER — FENTANYL CITRATE (PF) 100 MCG/2ML IJ SOLN
25.0000 ug | INTRAMUSCULAR | Status: DC | PRN
  Administered 2019-06-19: 25 ug via INTRAVENOUS
  Filled 2019-06-18: qty 2

## 2019-06-18 MED ORDER — DOPAMINE-DEXTROSE 3.2-5 MG/ML-% IV SOLN
2.5000 ug/kg/min | INTRAVENOUS | Status: DC
  Administered 2019-06-18: 2.5 ug/kg/min via INTRAVENOUS

## 2019-06-18 MED ORDER — MIDAZOLAM HCL 2 MG/2ML IJ SOLN: 2.0000 mg | INTRAMUSCULAR | Status: DC | PRN

## 2019-06-18 MED ORDER — SODIUM CHLORIDE 0.9 % IV SOLN: 250.0000 mL | INTRAVENOUS | Status: DC

## 2019-06-18 MED ORDER — PHENYLEPHRINE HCL-NACL 20-0.9 MG/250ML-% IV SOLN
0.0000 ug/min | INTRAVENOUS | Status: DC
  Administered 2019-06-18: 5 ug/min via INTRAVENOUS

## 2019-06-18 MED ORDER — VECURONIUM BROMIDE 10 MG IV SOLR
INTRAVENOUS | Status: AC
  Filled 2019-06-18: qty 10

## 2019-06-18 MED ORDER — SUFENTANIL CITRATE 50 MCG/ML IV SOLN
INTRAVENOUS | Status: DC | PRN
  Administered 2019-06-18: 20 ug via INTRAVENOUS
  Administered 2019-06-18: 40 ug via INTRAVENOUS
  Administered 2019-06-18: 5 ug via INTRAVENOUS
  Administered 2019-06-18: 10 ug via INTRAVENOUS
  Administered 2019-06-18: 30 ug via INTRAVENOUS
  Administered 2019-06-18: 10 ug via INTRAVENOUS
  Administered 2019-06-18: 20 ug via INTRAVENOUS
  Administered 2019-06-18: 45 ug via INTRAVENOUS

## 2019-06-18 MED ORDER — PROTAMINE SULFATE 10 MG/ML IV SOLN
INTRAVENOUS | Status: DC | PRN
  Administered 2019-06-18: 10 mg via INTRAVENOUS
  Administered 2019-06-18 (×2): 140 mg via INTRAVENOUS

## 2019-06-18 MED ORDER — ASPIRIN EC 325 MG PO TBEC
325.0000 mg | DELAYED_RELEASE_TABLET | Freq: Every day | ORAL | Status: DC
  Administered 2019-06-19: 325 mg via ORAL
  Filled 2019-06-18: qty 1

## 2019-06-18 MED ORDER — BISACODYL 5 MG PO TBEC
10.0000 mg | DELAYED_RELEASE_TABLET | Freq: Every day | ORAL | Status: DC
  Administered 2019-06-19: 10 mg via ORAL
  Filled 2019-06-18: qty 2

## 2019-06-18 MED ORDER — ACETAMINOPHEN 500 MG PO TABS
1000.0000 mg | ORAL_TABLET | Freq: Four times a day (QID) | ORAL | Status: DC
  Administered 2019-06-18 – 2019-06-20 (×6): 1000 mg via ORAL
  Filled 2019-06-18 (×6): qty 2

## 2019-06-18 MED ORDER — MIDAZOLAM HCL 5 MG/5ML IJ SOLN
INTRAMUSCULAR | Status: DC | PRN
  Administered 2019-06-18: 1 mg via INTRAVENOUS
  Administered 2019-06-18 (×3): 3 mg via INTRAVENOUS

## 2019-06-18 MED ORDER — SODIUM CHLORIDE 0.9% FLUSH
3.0000 mL | Freq: Two times a day (BID) | INTRAVENOUS | Status: DC
  Administered 2019-06-19 (×2): 3 mL via INTRAVENOUS

## 2019-06-18 MED ORDER — ACETAMINOPHEN 160 MG/5ML PO SOLN
650.0000 mg | Freq: Once | ORAL | Status: AC
  Administered 2019-06-18: 650 mg

## 2019-06-18 MED ORDER — LEVOTHYROXINE SODIUM 88 MCG PO TABS
88.0000 ug | ORAL_TABLET | Freq: Every day | ORAL | Status: DC
  Administered 2019-06-19 – 2019-06-23 (×5): 88 ug via ORAL
  Filled 2019-06-18 (×5): qty 1

## 2019-06-18 MED ORDER — SODIUM CHLORIDE 0.45 % IV SOLN
INTRAVENOUS | Status: DC | PRN
  Administered 2019-06-18: 13:00:00 via INTRAVENOUS

## 2019-06-18 MED ORDER — PROPOFOL 10 MG/ML IV BOLUS
INTRAVENOUS | Status: AC
  Filled 2019-06-18: qty 20

## 2019-06-18 MED ORDER — LACTATED RINGERS IV SOLN
INTRAVENOUS | Status: DC
  Administered 2019-06-18: 14:00:00 via INTRAVENOUS

## 2019-06-18 MED ORDER — PROPOFOL 10 MG/ML IV BOLUS
INTRAVENOUS | Status: DC | PRN
  Administered 2019-06-18: 110 mg via INTRAVENOUS

## 2019-06-18 MED ORDER — HEPARIN SODIUM (PORCINE) 1000 UNIT/ML IJ SOLN
INTRAMUSCULAR | Status: DC | PRN
  Administered 2019-06-18: 29000 [IU] via INTRAVENOUS

## 2019-06-18 MED ORDER — ASPIRIN 81 MG PO CHEW: 324.0000 mg | CHEWABLE_TABLET | Freq: Every day | ORAL | Status: DC

## 2019-06-18 MED ORDER — MAGNESIUM SULFATE 4 GM/100ML IV SOLN
4.0000 g | Freq: Once | INTRAVENOUS | Status: AC
  Administered 2019-06-18: 4 g via INTRAVENOUS
  Filled 2019-06-18: qty 100

## 2019-06-18 MED ORDER — SODIUM CHLORIDE 0.9 % IV SOLN
INTRAVENOUS | Status: DC | PRN
  Administered 2019-06-18: 750 mg via INTRAVENOUS

## 2019-06-18 MED ORDER — VECURONIUM BROMIDE 10 MG IV SOLR
INTRAVENOUS | Status: DC | PRN
  Administered 2019-06-18 (×2): 2 mg via INTRAVENOUS
  Administered 2019-06-18: 10 mg via INTRAVENOUS
  Administered 2019-06-18: 2 mg via INTRAVENOUS

## 2019-06-18 MED ORDER — LACTATED RINGERS IV SOLN
INTRAVENOUS | Status: DC | PRN
  Administered 2019-06-18 (×2): via INTRAVENOUS

## 2019-06-18 MED ORDER — SODIUM CHLORIDE 0.9 % IV SOLN
INTRAVENOUS | Status: DC
  Administered 2019-06-18 – 2019-06-19 (×2): via INTRAVENOUS

## 2019-06-18 MED ORDER — SUFENTANIL CITRATE 250 MCG/5ML IV SOLN
INTRAVENOUS | Status: AC
  Filled 2019-06-18: qty 5

## 2019-06-18 MED ORDER — DEXMEDETOMIDINE HCL IN NACL 200 MCG/50ML IV SOLN
0.0000 ug/kg/h | INTRAVENOUS | Status: DC
  Administered 2019-06-18: 0.5 ug/kg/h via INTRAVENOUS

## 2019-06-18 MED ORDER — SODIUM CHLORIDE 0.9 % IV SOLN
1.5000 g | Freq: Two times a day (BID) | INTRAVENOUS | Status: AC
  Administered 2019-06-18 – 2019-06-20 (×4): 1.5 g via INTRAVENOUS
  Filled 2019-06-18 (×4): qty 1.5

## 2019-06-18 MED ORDER — SODIUM CHLORIDE (PF) 0.9 % IJ SOLN
OROMUCOSAL | Status: DC | PRN
  Administered 2019-06-18 (×3): 4 mL via TOPICAL

## 2019-06-18 MED ORDER — LACTATED RINGERS IV SOLN: 500.0000 mL | Freq: Once | INTRAVENOUS | Status: DC | PRN

## 2019-06-18 MED ORDER — ROCURONIUM BROMIDE 10 MG/ML (PF) SYRINGE
PREFILLED_SYRINGE | INTRAVENOUS | Status: AC
  Filled 2019-06-18: qty 10

## 2019-06-18 MED ORDER — LACTATED RINGERS IV SOLN: INTRAVENOUS | Status: DC

## 2019-06-18 MED ORDER — ATORVASTATIN CALCIUM 80 MG PO TABS
80.0000 mg | ORAL_TABLET | Freq: Every day | ORAL | Status: DC
  Administered 2019-06-19 – 2019-06-22 (×4): 80 mg via ORAL
  Filled 2019-06-18 (×4): qty 1

## 2019-06-18 MED ORDER — NITROGLYCERIN IN D5W 200-5 MCG/ML-% IV SOLN: 0.0000 ug/min | INTRAVENOUS | Status: DC

## 2019-06-18 MED ORDER — VANCOMYCIN HCL IN DEXTROSE 1-5 GM/200ML-% IV SOLN
1000.0000 mg | Freq: Once | INTRAVENOUS | Status: AC
  Administered 2019-06-18: 1000 mg via INTRAVENOUS

## 2019-06-18 MED ORDER — ALBUMIN HUMAN 5 % IV SOLN
250.0000 mL | INTRAVENOUS | Status: AC | PRN
  Administered 2019-06-18 (×4): 12.5 g via INTRAVENOUS
  Filled 2019-06-18 (×2): qty 250

## 2019-06-18 MED ORDER — BISACODYL 10 MG RE SUPP: 10.0000 mg | Freq: Every day | RECTAL | Status: DC

## 2019-06-18 MED ORDER — METOPROLOL TARTRATE 25 MG/10 ML ORAL SUSPENSION: 12.5000 mg | Freq: Two times a day (BID) | ORAL | Status: DC

## 2019-06-18 SURGICAL SUPPLY — 71 items
BAG DECANTER FOR FLEXI CONT (MISCELLANEOUS) ×3 IMPLANT
BANDAGE ACE 4X5 VEL STRL LF (GAUZE/BANDAGES/DRESSINGS) ×2 IMPLANT
BANDAGE ACE 6X5 VEL STRL LF (GAUZE/BANDAGES/DRESSINGS) ×2 IMPLANT
BANDAGE ELASTIC 4 VELCRO ST LF (GAUZE/BANDAGES/DRESSINGS) ×1 IMPLANT
BANDAGE ELASTIC 6 VELCRO ST LF (GAUZE/BANDAGES/DRESSINGS) ×1 IMPLANT
BLADE STERNUM SYSTEM 6 (BLADE) ×3 IMPLANT
BLADE SURG 11 STRL SS (BLADE) ×1 IMPLANT
BNDG GAUZE ELAST 4 BULKY (GAUZE/BANDAGES/DRESSINGS) ×3 IMPLANT
CANISTER SUCT 3000ML PPV (MISCELLANEOUS) ×3 IMPLANT
CATH CPB KIT GERHARDT (MISCELLANEOUS) ×3 IMPLANT
CATH THORACIC 28FR (CATHETERS) ×3 IMPLANT
COVER WAND RF STERILE (DRAPES) ×2 IMPLANT
CRADLE DONUT ADULT HEAD (MISCELLANEOUS) ×3 IMPLANT
DRAIN CHANNEL 28F RND 3/8 FF (WOUND CARE) ×3 IMPLANT
DRAPE CARDIOVASCULAR INCISE (DRAPES) ×1
DRAPE SLUSH/WARMER DISC (DRAPES) ×3 IMPLANT
DRAPE SRG 135X102X78XABS (DRAPES) ×2 IMPLANT
DRSG AQUACEL AG ADV 3.5X14 (GAUZE/BANDAGES/DRESSINGS) ×3 IMPLANT
ELECT BLADE 4.0 EZ CLEAN MEGAD (MISCELLANEOUS) ×3
ELECT REM PT RETURN 9FT ADLT (ELECTROSURGICAL) ×6
ELECTRODE BLDE 4.0 EZ CLN MEGD (MISCELLANEOUS) ×2 IMPLANT
ELECTRODE REM PT RTRN 9FT ADLT (ELECTROSURGICAL) ×4 IMPLANT
FELT TEFLON 1X6 (MISCELLANEOUS) ×5 IMPLANT
GAUZE SPONGE 4X4 12PLY STRL (GAUZE/BANDAGES/DRESSINGS) ×6 IMPLANT
GLOVE BIO SURGEON STRL SZ 6.5 (GLOVE) ×6 IMPLANT
GOWN STRL REUS W/ TWL LRG LVL3 (GOWN DISPOSABLE) ×8 IMPLANT
GOWN STRL REUS W/TWL LRG LVL3 (GOWN DISPOSABLE) ×8
HEMOSTAT POWDER SURGIFOAM 1G (HEMOSTASIS) ×9 IMPLANT
HEMOSTAT SURGICEL 2X14 (HEMOSTASIS) ×3 IMPLANT
KIT BASIN OR (CUSTOM PROCEDURE TRAY) ×3 IMPLANT
KIT CATH SUCT 8FR (CATHETERS) ×3 IMPLANT
KIT SUCTION CATH 14FR (SUCTIONS) ×6 IMPLANT
KIT TURNOVER KIT B (KITS) ×3 IMPLANT
KIT VASOVIEW HEMOPRO 2 VH 4000 (KITS) ×3 IMPLANT
LEAD PACING MYOCARDI (MISCELLANEOUS) ×3 IMPLANT
MARKER GRAFT CORONARY BYPASS (MISCELLANEOUS) ×9 IMPLANT
MATRIX HEMOSTAT SURGIFLO (HEMOSTASIS) ×1 IMPLANT
NS IRRIG 1000ML POUR BTL (IV SOLUTION) ×15 IMPLANT
PACK E OPEN HEART (SUTURE) ×3 IMPLANT
PACK OPEN HEART (CUSTOM PROCEDURE TRAY) ×3 IMPLANT
PAD ARMBOARD 7.5X6 YLW CONV (MISCELLANEOUS) ×5 IMPLANT
PAD ELECT DEFIB RADIOL ZOLL (MISCELLANEOUS) ×3 IMPLANT
PENCIL BUTTON HOLSTER BLD 10FT (ELECTRODE) ×3 IMPLANT
PUNCH AORTIC ROTATE  4.5MM 8IN (MISCELLANEOUS) ×1 IMPLANT
SET CARDIOPLEGIA MPS 5001102 (MISCELLANEOUS) ×1 IMPLANT
SOLUTION ANTI FOG 6CC (MISCELLANEOUS) ×1 IMPLANT
SPONGE LAP 18X18 RF (DISPOSABLE) ×2 IMPLANT
SUT BONE WAX W31G (SUTURE) ×3 IMPLANT
SUT MNCRL AB 4-0 PS2 18 (SUTURE) ×1 IMPLANT
SUT PROLENE 3 0 SH1 36 (SUTURE) ×3 IMPLANT
SUT PROLENE 4 0 TF (SUTURE) ×6 IMPLANT
SUT PROLENE 6 0 C 1 30 (SUTURE) ×2 IMPLANT
SUT PROLENE 6 0 CC (SUTURE) ×6 IMPLANT
SUT PROLENE 7 0 BV1 MDA (SUTURE) ×4 IMPLANT
SUT PROLENE 8 0 BV175 6 (SUTURE) ×2 IMPLANT
SUT STEEL 6MS V (SUTURE) ×3 IMPLANT
SUT STEEL SZ 6 DBL 3X14 BALL (SUTURE) ×3 IMPLANT
SUT VIC AB 1 CTX 18 (SUTURE) ×6 IMPLANT
SUT VIC AB 2-0 CT1 27 (SUTURE) ×1
SUT VIC AB 2-0 CT1 TAPERPNT 27 (SUTURE) IMPLANT
SYSTEM SAHARA CHEST DRAIN ATS (WOUND CARE) ×3 IMPLANT
TAPE CLOTH SOFT 2X10 (GAUZE/BANDAGES/DRESSINGS) ×1 IMPLANT
TAPE CLOTH SURG 4X10 WHT LF (GAUZE/BANDAGES/DRESSINGS) ×2 IMPLANT
TOWEL GREEN STERILE (TOWEL DISPOSABLE) ×3 IMPLANT
TOWEL GREEN STERILE FF (TOWEL DISPOSABLE) ×3 IMPLANT
TRAY FOLEY SLVR 16FR LF STAT (SET/KITS/TRAYS/PACK) ×1 IMPLANT
TRAY FOLEY SLVR 16FR TEMP STAT (SET/KITS/TRAYS/PACK) ×2 IMPLANT
TUBE SUCT INTRACARD DLP 20F (MISCELLANEOUS) ×1 IMPLANT
TUBING LAP HI FLOW INSUFFLATIO (TUBING) ×3 IMPLANT
UNDERPAD 30X30 (UNDERPADS AND DIAPERS) ×3 IMPLANT
WATER STERILE IRR 1000ML POUR (IV SOLUTION) ×6 IMPLANT

## 2019-06-18 NOTE — Procedures (Signed)
Extubation Procedure Note  Patient Details:   Name: Kathleen Terrell DOB: 10-Oct-1946 MRN: [id number]   Airway Documentation:    Vent end date: 06/18/19 Vent end time: 1725   Evaluation  O2 sats: stable throughout Complications: No apparent complications Patient did tolerate procedure well. Bilateral Breath Sounds: Clear   Yes   Pt was extubated at 1725, there was a cuff leak present prior to removal. NIF -21 VC 5.5L prior to extubation. Pt was able to say her name afterwards. Pt placed on 4L [place] at this time. Will continue to monitor.  Nona Gracey A Terrion Poblano 06/18/2019, 5:34 PM

## 2019-06-18 NOTE — Progress Notes (Signed)
      [address]       [place] [address]             [phone]     Pre Procedure note for inpatients:   Kathleen Terrell has been scheduled for Procedure(s): CORONARY ARTERY BYPASS GRAFTING (CABG) (N/A) TRANSESOPHAGEAL ECHOCARDIOGRAM (TEE) (N/A) today. The various methods of treatment have been discussed with the patient. After consideration of the risks, benefits and treatment options the patient has consented to the planned procedure.   The patient has been seen and labs reviewed. There are no changes in the patient's condition to prevent proceeding with the planned procedure today.  Recent labs:  Lab Results  Component Value Date   WBC 6.9 06/18/2019   HGB 12.9 06/18/2019   HCT 40.0 06/18/2019   PLT 219 06/18/2019   GLUCOSE 114 (H) 06/18/2019   NA 137 06/18/2019   K 3.8 06/18/2019   CL 103 06/18/2019   CREATININE 0.90 06/18/2019   BUN 17 06/18/2019   CO2 27 06/18/2019   INR 1.0 06/18/2019   HGBA1C 6.5 (H) 06/18/2019    Grace Isaac, MD 06/18/2019 7:11 AM

## 2019-06-18 NOTE — Brief Op Note (Signed)
      [address]       [place], [address]             [phone]       06/18/2019  3:28 PM  PATIENT:  Kathleen Terrell  73 y.o. female  PRE-OPERATIVE DIAGNOSIS:  CAD  POST-OPERATIVE DIAGNOSIS:  CAD  PROCEDURE:  Procedure(s): CORONARY ARTERY BYPASS GRAFTING (CABG) x 3, ON PUMP, LIMA TO LAD, SVG TO RCA, SVG TO OM 1, USING LEFT INTERNAL MAMMARY ARTERY AND RIGHT GREATER SAPHENOUS VEIN HARVESTED ENDOSCOPICALLY (N/A) TRANSESOPHAGEAL ECHOCARDIOGRAM (TEE) (N/A)  SURGEON:  Surgeon(s) and Role:    * Grace Isaac, MD - Primary    * Lightfoot, Lucile Crater, MD - Assisting  PHYSICIAN ASSISTANT:  Nicholes Rough, PA-C   ANESTHESIA:   general  EBL:  650 mL   BLOOD ADMINISTERED:none  DRAINS: routine   LOCAL MEDICATIONS USED:  NONE  SPECIMEN:  No Specimen  DISPOSITION OF SPECIMEN:  PATHOLOGY  COUNTS:  YES  DICTATION: .Dragon Dictation  PLAN OF CARE: Admit to inpatient   PATIENT DISPOSITION:  ICU - intubated and hemodynamically stable.   Delay start of Pharmacological VTE agent (>24hrs) due to surgical blood loss or risk of bleeding: yes

## 2019-06-18 NOTE — Anesthesia Procedure Notes (Signed)
Arterial Line Insertion Start/End6/23/2020 7:00 AM, 06/18/2019 7:10 AM Performed by: Moshe Salisbury, CRNA, CRNA  Patient location: Pre-op. Preanesthetic checklist: patient identified, IV checked, site marked, risks and benefits discussed, surgical consent, monitors and equipment checked, pre-op evaluation, timeout performed and anesthesia consent Lidocaine 1% used for infiltration and patient sedated Right, radial was placed Catheter size: 20 G Hand hygiene performed , maximum sterile barriers used  and Seldinger technique used Allen's test indicative of satisfactory collateral circulation Attempts: 1 Procedure performed without using ultrasound guided technique. Following insertion, dressing applied and Biopatch. Post procedure assessment: normal  Patient tolerated the procedure well with no immediate complications.

## 2019-06-18 NOTE — Transfer of Care (Signed)
Immediate Anesthesia Transfer of Care Note  Patient: Kathleen Terrell  Procedure(s) Performed: CORONARY ARTERY BYPASS GRAFTING (CABG) x 3, ON PUMP, LIMA TO LAD, SVG TO RCA, SVG TO OM 1, USING LEFT INTERNAL MAMMARY ARTERY AND RIGHT GREATER SAPHENOUS VEIN HARVESTED ENDOSCOPICALLY (N/A Chest) TRANSESOPHAGEAL ECHOCARDIOGRAM (TEE) (N/A )  Patient Location: ICU  Anesthesia Type:General  Level of Consciousness: unresponsive and Patient remains intubated per anesthesia plan  Airway & Oxygen Therapy: Patient remains intubated per anesthesia plan and Patient placed on Ventilator (see vital sign flow sheet for setting)  Post-op Assessment: Report given to RN, Post -op Vital signs reviewed and stable and Patient moving all extremities  Post vital signs: Reviewed and stable  Last Vitals:  Vitals Value Taken Time  BP 100/61 06/18/19 1315  Temp 35.9 C 06/18/19 1316  Pulse 89 06/18/19 1316  Resp 12 06/18/19 1316  SpO2 100 % 06/18/19 1316  Vitals shown include unvalidated device data.  Last Pain:  Vitals:   06/18/19 0507  TempSrc: Oral  PainSc:       Patients Stated Pain Goal: 2 (52/77/82 4235)  Complications: No apparent anesthesia complications

## 2019-06-18 NOTE — Progress Notes (Signed)
Assisted tele visit to patient with daughters, [name] and [name].  Maryelizabeth Rowan, RN

## 2019-06-18 NOTE — Anesthesia Procedure Notes (Signed)
Central Venous Catheter Insertion Performed by: Roderic Palau, MD, anesthesiologist Start/End6/23/2020 6:35 AM, 06/18/2019 6:50 AM Patient location: Pre-op. Preanesthetic checklist: patient identified, IV checked, site marked, risks and benefits discussed, surgical consent, monitors and equipment checked, pre-op evaluation, timeout performed and anesthesia consent Hand hygiene performed  and maximum sterile barriers used  PA cath was placed.Swan type:thermodilution PA Cath depth:50 Procedure performed without using ultrasound guided technique. Attempts: 1 Patient tolerated the procedure well with no immediate complications.

## 2019-06-18 NOTE — Progress Notes (Signed)
I completed a follow up visit with the patient post surgery. I visited the patient's room with her nurse present. I shared words of encouragement and prayer. I shared that the Chaplain is available for additional support as needed or requested.    06/18/19 1800  Clinical Encounter Type  Visited With Patient  Visit Type Follow-up;Spiritual support  Referral From Nurse  Consult/Referral To Chaplain  Spiritual Encounters  Spiritual Needs Prayer    Chaplain Dr. Redgie Grayer

## 2019-06-18 NOTE — Progress Notes (Signed)
      [address]       [place] [address]             [phone]      S/p CABG x 3  Alert and talking with family by video  BP 107/63   Pulse 97   Temp 98.8 F (37.1 C)   Resp 14   Ht 5' 3.5" (1.613 m)   Wt 80.2 kg Comment: scale a  SpO2 100%   BMI 30.81 kg/m  23/15 CI= 2.6 on dopamine @ 2.5  Hct= 28  Doing well early postop  Librada Castronovo C. Roxan Hockey, MD [facility] [phone]

## 2019-06-18 NOTE — Progress Notes (Signed)
PT transferred to OR, report given to anesthisiologist.  Pt belongings given to [place] -Pt daughter.

## 2019-06-18 NOTE — Anesthesia Procedure Notes (Signed)
Procedure Name: Intubation Date/Time: 06/18/2019 7:51 AM Performed by: Moshe Salisbury, CRNA Pre-anesthesia Checklist: Patient identified, Emergency Drugs available, Suction available and Patient being monitored Patient Re-evaluated:Patient Re-evaluated prior to induction Oxygen Delivery Method: Circle System Utilized Preoxygenation: Pre-oxygenation with 100% oxygen Induction Type: IV induction Ventilation: Mask ventilation without difficulty Laryngoscope Size: Mac and 3 Grade View: Grade II Tube type: Oral Tube size: 8.0 mm Number of attempts: 1 Airway Equipment and Method: Stylet Placement Confirmation: ETT inserted through vocal cords under direct vision,  positive ETCO2 and breath sounds checked- equal and bilateral Secured at: 20 cm Tube secured with: Tape Dental Injury: Teeth and Oropharynx as per pre-operative assessment

## 2019-06-18 NOTE — Anesthesia Procedure Notes (Signed)
Central Venous Catheter Insertion Performed by: Roderic Palau, MD, anesthesiologist Start/End6/23/2020 6:35 AM, 06/18/2019 6:50 AM Patient location: Pre-op. Preanesthetic checklist: patient identified, IV checked, site marked, risks and benefits discussed, surgical consent, monitors and equipment checked, pre-op evaluation, timeout performed and anesthesia consent Position: Trendelenburg Lidocaine 1% used for infiltration and patient sedated Hand hygiene performed , maximum sterile barriers used  and Seldinger technique used Catheter size: 9 Fr Total catheter length 10. Central line was placed.MAC introducer Swan type:thermodilution Procedure performed using ultrasound guided technique. Ultrasound Notes:anatomy identified, needle tip was noted to be adjacent to the nerve/plexus identified, no ultrasound evidence of intravascular and/or intraneural injection and image(s) printed for medical record Attempts: 1 Following insertion, line sutured, dressing applied and Biopatch. Post procedure assessment: blood return through all ports, free fluid flow and no air  Patient tolerated the procedure well with no immediate complications.

## 2019-06-18 NOTE — Plan of Care (Signed)
  Problem: Clinical Measurements: Goal: Ability to maintain clinical measurements within normal limits will improve Outcome: Progressing Goal: Respiratory complications will improve Outcome: Progressing Goal: Cardiovascular complication will be avoided Outcome: Progressing   Problem: Activity: Goal: Risk for activity intolerance will decrease Outcome: Progressing   

## 2019-06-18 NOTE — Progress Notes (Signed)
Assisted tele visit to patient with family member.  Nhan Qualley R, RN  

## 2019-06-18 NOTE — Anesthesia Preprocedure Evaluation (Signed)
Anesthesia Evaluation  Patient identified by MRN, date of birth, ID band Patient awake    Reviewed: Allergy & Precautions, H&P , NPO status , Patient's Chart, lab work & pertinent test results  History of Anesthesia Complications (+) PONV and history of anesthetic complications  Airway Mallampati: II   Neck ROM: full    Dental   Pulmonary former smoker,    breath sounds clear to auscultation       Cardiovascular hypertension, + CAD   Rhythm:regular Rate:Normal     Neuro/Psych  Headaches,    GI/Hepatic   Endo/Other  diabetes, Type 2Hypothyroidism   Renal/GU      Musculoskeletal   Abdominal   Peds  Hematology   Anesthesia Other Findings   Reproductive/Obstetrics                             Anesthesia Physical Anesthesia Plan  ASA: III  Anesthesia Plan: General   Post-op Pain Management:    Induction: Intravenous  PONV Risk Score and Plan: 4 or greater and Ondansetron, Dexamethasone, Midazolam and Treatment may vary due to age or medical condition  Airway Management Planned: Oral ETT  Additional Equipment: Arterial line, CVP, PA Cath, TEE and Ultrasound Guidance Line Placement  Intra-op Plan:   Post-operative Plan: Post-operative intubation/ventilation  Informed Consent: I have reviewed the patients History and Physical, chart, labs and discussed the procedure including the risks, benefits and alternatives for the proposed anesthesia with the patient or authorized representative who has indicated his/her understanding and acceptance.       Plan Discussed with: CRNA, Anesthesiologist and Surgeon  Anesthesia Plan Comments:         Anesthesia Quick Evaluation

## 2019-06-19 ENCOUNTER — Encounter (HOSPITAL_COMMUNITY): Payer: Self-pay | Admitting: Cardiothoracic Surgery

## 2019-06-19 ENCOUNTER — Inpatient Hospital Stay (HOSPITAL_COMMUNITY): Payer: Medicare Other

## 2019-06-19 LAB — BASIC METABOLIC PANEL
Anion gap: 6 (ref 5–15)
Anion gap: 5 (ref 5–15)
BUN: 8 mg/dL (ref 8–23)
BUN: 8 mg/dL (ref 8–23)
CO2: 24 mmol/L (ref 22–32)
CO2: 25 mmol/L (ref 22–32)
Calcium: 8.2 mg/dL — ABNORMAL LOW (ref 8.9–10.3)
Calcium: 8.2 mg/dL — ABNORMAL LOW (ref 8.9–10.3)
Chloride: 111 mmol/L (ref 98–111)
Chloride: 110 mmol/L (ref 98–111)
Creatinine, Ser: 0.64 mg/dL (ref 0.44–1.00)
Creatinine, Ser: 0.62 mg/dL (ref 0.44–1.00)
GFR calc Af Amer: >60 mL/min (ref >60)
GFR calc Af Amer: >60 mL/min (ref >60)
GFR calc non Af Amer: >60 mL/min (ref >60)
GFR calc non Af Amer: >60 mL/min (ref >60)
Glucose, Bld: 114 mg/dL — ABNORMAL HIGH (ref 70–99)
Glucose, Bld: 117 mg/dL — ABNORMAL HIGH (ref 70–99)
Potassium: 4.1 mmol/L (ref 3.5–5.1)
Potassium: 3.9 mmol/L (ref 3.5–5.1)
Sodium: 141 mmol/L (ref 135–145)
Sodium: 140 mmol/L (ref 135–145)

## 2019-06-19 LAB — GLUCOSE, CAPILLARY
Glucose-Capillary: 104 mg/dL — ABNORMAL HIGH (ref 70–99)
Glucose-Capillary: 105 mg/dL — ABNORMAL HIGH (ref 70–99)
Glucose-Capillary: 105 mg/dL — ABNORMAL HIGH (ref 70–99)
Glucose-Capillary: 108 mg/dL — ABNORMAL HIGH (ref 70–99)
Glucose-Capillary: 110 mg/dL — ABNORMAL HIGH (ref 70–99)
Glucose-Capillary: 110 mg/dL — ABNORMAL HIGH (ref 70–99)
Glucose-Capillary: 111 mg/dL — ABNORMAL HIGH (ref 70–99)
Glucose-Capillary: 114 mg/dL — ABNORMAL HIGH (ref 70–99)
Glucose-Capillary: 122 mg/dL — ABNORMAL HIGH (ref 70–99)
Glucose-Capillary: 133 mg/dL — ABNORMAL HIGH (ref 70–99)
Glucose-Capillary: 87 mg/dL (ref 70–99)
Glucose-Capillary: 89 mg/dL (ref 70–99)
Glucose-Capillary: 92 mg/dL (ref 70–99)
Glucose-Capillary: 92 mg/dL (ref 70–99)
Glucose-Capillary: 97 mg/dL (ref 70–99)

## 2019-06-19 LAB — POCT I-STAT 7, (LYTES, BLD GAS, ICA,H+H)
Bicarbonate: 23.1 mmol/L (ref 20.0–28.0)
Calcium, Ion: 1.09 mmol/L — ABNORMAL LOW (ref 1.15–1.40)
HCT: 26 % — ABNORMAL LOW (ref 36.0–46.0)
Hemoglobin: 8.8 g/dL — ABNORMAL LOW (ref 12.0–15.0)
O2 Saturation: 99 %
Potassium: 3.8 mmol/L (ref 3.5–5.1)
Sodium: 143 mmol/L (ref 135–145)
TCO2: 24 mmol/L (ref 22–32)
pCO2 arterial: 32.2 mmHg (ref 32.0–48.0)
pH, Arterial: 7.463 — ABNORMAL HIGH (ref 7.350–7.450)
pO2, Arterial: 152 mmHg — ABNORMAL HIGH (ref 83.0–108.0)

## 2019-06-19 LAB — CBC
HCT: 26.7 % — ABNORMAL LOW (ref 36.0–46.0)
HCT: 26.3 % — ABNORMAL LOW (ref 36.0–46.0)
Hemoglobin: 8.5 g/dL — ABNORMAL LOW (ref 12.0–15.0)
Hemoglobin: 8.1 g/dL — ABNORMAL LOW (ref 12.0–15.0)
MCH: 27.4 pg (ref 26.0–34.0)
MCH: 26.7 pg (ref 26.0–34.0)
MCHC: 31.8 g/dL (ref 30.0–36.0)
MCHC: 30.8 g/dL (ref 30.0–36.0)
MCV: 86.1 fL (ref 80.0–100.0)
MCV: 86.8 fL (ref 80.0–100.0)
Platelets: 130 10*3/uL — ABNORMAL LOW (ref 150–400)
Platelets: 116 10*3/uL — ABNORMAL LOW (ref 150–400)
RBC: 3.1 MIL/uL — ABNORMAL LOW (ref 3.87–5.11)
RBC: 3.03 MIL/uL — ABNORMAL LOW (ref 3.87–5.11)
RDW: 15.2 % (ref 11.5–15.5)
RDW: 15.5 % (ref 11.5–15.5)
WBC: 9.5 10*3/uL (ref 4.0–10.5)
WBC: 9.2 10*3/uL (ref 4.0–10.5)
nRBC: 0 % (ref 0.0–0.2)
nRBC: 0 % (ref 0.0–0.2)

## 2019-06-19 LAB — ECHO INTRAOPERATIVE TEE
Height: 63.5 in
Weight: 2827.2 oz

## 2019-06-19 LAB — MAGNESIUM
Magnesium: 2.5 mg/dL — ABNORMAL HIGH (ref 1.7–2.4)
Magnesium: 2.5 mg/dL — ABNORMAL HIGH (ref 1.7–2.4)

## 2019-06-19 MED ORDER — ORAL CARE MOUTH RINSE
15.0000 mL | Freq: Two times a day (BID) | OROMUCOSAL | Status: DC
  Administered 2019-06-19 – 2019-06-23 (×5): 15 mL via OROMUCOSAL

## 2019-06-19 MED ORDER — INSULIN ASPART 100 UNIT/ML ~~LOC~~ SOLN
0.0000 [IU] | SUBCUTANEOUS | Status: DC
  Administered 2019-06-19: 2 [IU] via SUBCUTANEOUS

## 2019-06-19 MED ORDER — CHLORHEXIDINE GLUCONATE CLOTH 2 % EX PADS: 6.0000 | MEDICATED_PAD | Freq: Every day | CUTANEOUS | Status: DC

## 2019-06-19 MED FILL — Potassium Chloride Inj 2 mEq/ML: INTRAVENOUS | Qty: 40 | Status: AC

## 2019-06-19 MED FILL — Heparin Sodium (Porcine) Inj 1000 Unit/ML: INTRAMUSCULAR | Qty: 30 | Status: AC

## 2019-06-19 MED FILL — Magnesium Sulfate Inj 50%: INTRAMUSCULAR | Qty: 10 | Status: AC

## 2019-06-19 NOTE — Anesthesia Postprocedure Evaluation (Signed)
Anesthesia Post Note  Patient: Kathleen Terrell  Procedure(s) Performed: CORONARY ARTERY BYPASS GRAFTING (CABG) x 3, ON PUMP, LIMA TO LAD, SVG TO RCA, SVG TO OM 1, USING LEFT INTERNAL MAMMARY ARTERY AND RIGHT GREATER SAPHENOUS VEIN HARVESTED ENDOSCOPICALLY (N/A Chest) TRANSESOPHAGEAL ECHOCARDIOGRAM (TEE) (N/A )     Patient location during evaluation: SICU Anesthesia Type: General Level of consciousness: sedated Pain management: pain level controlled Vital Signs Assessment: post-procedure vital signs reviewed and stable Respiratory status: patient remains intubated per anesthesia plan Cardiovascular status: stable Postop Assessment: no apparent nausea or vomiting Anesthetic complications: no    Last Vitals:  Vitals:   06/19/19 1745 06/19/19 1800  BP:  (!) 91/55  Pulse: 65 (!) 101  Resp: (!) 25 (!) 24  Temp:    SpO2: 100% 98%    Last Pain:  Vitals:   06/19/19 1629  TempSrc:   PainSc: Asleep                 Ajene Carchi S

## 2019-06-19 NOTE — Progress Notes (Signed)
[facility] DAILY ICU PROGRESS NOTE                   [address]            [place] [address]          [phone]   1 Day Post-Op Procedure(s) (LRB): CORONARY ARTERY BYPASS GRAFTING (CABG) x 3, ON PUMP, LIMA TO LAD, SVG TO RCA, SVG TO OM 1, USING LEFT INTERNAL MAMMARY ARTERY AND RIGHT GREATER SAPHENOUS VEIN HARVESTED ENDOSCOPICALLY (N/A) TRANSESOPHAGEAL ECHOCARDIOGRAM (TEE) (N/A)  Total Length of Stay:  LOS: 5 days   Subjective: Feels okay this morning. Pain is well controlled.   Objective: Vital signs in last 24 hours: Temp:  [95.9 F (35.5 C)-100 F (37.8 C)] 99.3 F (37.4 C) (06/24 0700) Pulse Rate:  [87-104] 94 (06/24 0700) Cardiac Rhythm: Normal sinus rhythm (06/24 0400) Resp:  [11-30] 20 (06/24 0700) BP: (94-138)/(56-80) 119/58 (06/24 0700) SpO2:  [89 %-100 %] 100 % (06/24 0700) Arterial Line BP: (96-206)/(42-131) 136/45 (06/24 0700) FiO2 (%):  [40 %-50 %] 40 % (06/23 1651) Weight:  [85.1 kg] 85.1 kg (06/24 0500)  Filed Weights   06/17/19 0610 06/18/19 0507 06/19/19 0500  Weight: 81.1 kg 80.2 kg 85.1 kg    Weight change: 4.949 kg   Hemodynamic parameters for last 24 hours: PAP: (15-31)/(3-20) 24/11 CO:  [2.2 L/min-5 L/min] 4.7 L/min CI:  [1.2 L/min/m2-2.7 L/min/m2] 2.5 L/min/m2  Intake/Output from previous day: 06/23 0701 - 06/24 0700 In: 5022.8 [I.V.:3492.5; Blood:320; IV Piggyback:1210.4] Out: 3734 [Urine:3015; Blood:650; Chest Tube:316]  Intake/Output this shift: No intake/output data recorded.  Current Meds: Scheduled Meds: . acetaminophen  1,000 mg Oral Q6H   Or  . acetaminophen (TYLENOL) oral liquid 160 mg/5 mL  1,000 mg Per Tube Q6H  . aspirin EC  325 mg Oral Daily   Or  . aspirin  324 mg Per Tube Daily  . atorvastatin  80 mg Oral q1800  . bisacodyl  10 mg Oral Daily   Or  . bisacodyl  10 mg Rectal Daily  . Chlorhexidine Gluconate Cloth  6 each Topical Daily  . docusate sodium  200 mg Oral Daily  . insulin regular  0-10  Units Intravenous TID WC  . levothyroxine  88 mcg Oral QAC breakfast  . mouth rinse  15 mL Mouth Rinse q12n4p  . metoprolol tartrate  12.5 mg Oral BID   Or  . metoprolol tartrate  12.5 mg Per Tube BID  . [START ON 06/20/2019] pantoprazole  40 mg Oral Daily  . sodium chloride flush  3 mL Intravenous Q12H   Continuous Infusions: . sodium chloride 20 mL/hr at 06/19/19 0600  . sodium chloride    . sodium chloride 10 mL/hr at 06/18/19 1417  . cefUROXime (ZINACEF)  IV Stopped (06/19/19 0159)  . dexmedetomidine (PRECEDEX) IV infusion Stopped (06/18/19 1557)  . DOPamine 2.5 mcg/kg/min (06/19/19 0600)  . insulin 1.4 mL/hr at 06/19/19 0600  . lactated ringers    . lactated ringers Stopped (06/18/19 1418)  . lactated ringers Stopped (06/19/19 0552)  . nitroGLYCERIN Stopped (06/18/19 1416)  . phenylephrine (NEO-SYNEPHRINE) Adult infusion Stopped (06/18/19 2123)   PRN Meds:.sodium chloride, fentaNYL (SUBLIMAZE) injection, lactated ringers, metoprolol tartrate, midazolam, ondansetron (ZOFRAN) IV, oxyCODONE, sodium chloride flush  General appearance: alert, cooperative and no distress Heart: regular rate and rhythm, S1, S2 normal, no murmur, click, rub or gallop Lungs: clear to auscultation bilaterally Abdomen: soft, non-tender; bowel sounds normal; no masses,  no  organomegaly Extremities: extremities normal, atraumatic, no cyanosis or edema Wound: clean and dry  Lab Results: CBC: Recent Labs    06/18/19 1910 06/18/19 1925 06/19/19 0254  WBC 10.3  --  9.5  HGB 8.8* 8.8* 8.5*  HCT 27.7* 26.0* 26.7*  PLT 125*  --  130*   BMET:  Recent Labs    06/18/19 0348  06/18/19 1925 06/19/19 0254  NA 137   < > 143 141  K 3.8   < > 4.5 4.1  CL 103  --  108 111  CO2 27  --   --  24  GLUCOSE 114*   < > 142* 114*  BUN 17  --  13 8  CREATININE 0.90   < > 0.60 0.64  CALCIUM 9.4  --   --  8.2*   < > = values in this interval not displayed.    CMET: Lab Results  Component Value Date   WBC  9.5 06/19/2019   HGB 8.5 (L) 06/19/2019   HCT 26.7 (L) 06/19/2019   PLT 130 (L) 06/19/2019   GLUCOSE 114 (H) 06/19/2019   NA 141 06/19/2019   K 4.1 06/19/2019   CL 111 06/19/2019   CREATININE 0.64 06/19/2019   BUN 8 06/19/2019   CO2 24 06/19/2019   INR 1.6 (H) 06/18/2019   HGBA1C 6.5 (H) 06/18/2019      PT/INR:  Recent Labs    06/18/19 1320  LABPROT 18.6*  INR 1.6*   Radiology: Dg Chest Port 1 View  Result Date: 06/18/2019 CLINICAL DATA:  CABG. EXAM: PORTABLE CHEST 1 VIEW COMPARISON:  Chest x-ray from yesterday. FINDINGS: Endotracheal tube in position with the tip 3.7 cm above the level of the carina. Enteric tube entering the stomach with the proximal side port in the distal esophagus. Right internal jugular Swan-Ganz catheter with the tip in the main pulmonary outflow tract. Mediastinal and left chest tubes are in good position. Interval CABG. Normal heart size. Low lung volumes with bronchovascular crowding. Minimal bibasilar atelectasis. No pleural effusion or pneumothorax. No acute osseous abnormality. IMPRESSION: 1. Enteric tube proximal side port in the distal esophagus. Recommend advancing 5 cm. 2. Other lines and tubes are appropriately positioned. 3. Interval CABG.  Minimal bibasilar atelectasis. Electronically Signed   By: Titus Dubin M.D.   On: 06/18/2019 13:51     Assessment/Plan: S/P Procedure(s) (LRB): CORONARY ARTERY BYPASS GRAFTING (CABG) x 3, ON PUMP, LIMA TO LAD, SVG TO RCA, SVG TO OM 1, USING LEFT INTERNAL MAMMARY ARTERY AND RIGHT GREATER SAPHENOUS VEIN HARVESTED ENDOSCOPICALLY (N/A) TRANSESOPHAGEAL ECHOCARDIOGRAM (TEE) (N/A)  1. CV- NSR in the 90s, BP well controlled. On dopamine. Continue ASA, statin  2. Pulm-chest xray stable with left chest tube in place. Continue to encourage incentive spirometer. 316 out of the left chest tube-keep.  3. Renal-creatinine 0.64, potassium 4.1.  4. Endo-switch insulin drip to SSI 5. H and H stable. Continue to  trend 6. Continue post-op ABX   Plan: discontinue swan-ganz catheter, arterial line, and continue chest tubes for now. See progression orders. Ambulate in the halls and OOB to chair. Doing well POD 1.     Kathleen Terrell 06/19/2019 7:42 AM

## 2019-06-19 NOTE — Progress Notes (Signed)
On afternoon rounds, Gerhardt MD ordered to d/c mediastinal chest tube, to wean the dopamine off, and to d/c arterial line.

## 2019-06-19 NOTE — Progress Notes (Addendum)
EVENING ROUNDS NOTE :     [address]       [place] [address]             [phone]                 1 Day Post-Op Procedure(s) (LRB): CORONARY ARTERY BYPASS GRAFTING (CABG) x 3, ON PUMP, LIMA TO LAD, SVG TO RCA, SVG TO OM 1, USING LEFT INTERNAL MAMMARY ARTERY AND RIGHT GREATER SAPHENOUS VEIN HARVESTED ENDOSCOPICALLY (N/A) TRANSESOPHAGEAL ECHOCARDIOGRAM (TEE) (N/A)  Total Length of Stay:  LOS: 5 days  BP (!) 108/56   Pulse 95   Temp 97.9 F (36.6 C) (Oral)   Resp 12   Ht 5' 3.5" (1.613 m)   Wt 85.1 kg   SpO2 95%   BMI 32.71 kg/m   .Intake/Output      06/24 0701 - 06/25 0700   I.V. (mL/kg) 365.9 (4.3)   Blood    IV Piggyback 100   Total Intake(mL/kg) 465.9 (5.5)   Urine (mL/kg/hr) 490 (0.5)   Blood    Chest Tube 210   Total Output 700   Net -234.1         . sodium chloride Stopped (06/19/19 0919)  . sodium chloride    . sodium chloride 20 mL/hr at 06/19/19 1512  . cefUROXime (ZINACEF)  IV Stopped (06/19/19 1543)  . dexmedetomidine (PRECEDEX) IV infusion Stopped (06/18/19 1557)  . DOPamine Stopped (06/19/19 1705)  . lactated ringers    . lactated ringers Stopped (06/18/19 1418)  . lactated ringers 20 mL/hr at 06/19/19 1900  . nitroGLYCERIN Stopped (06/18/19 1416)  . phenylephrine (NEO-SYNEPHRINE) Adult infusion Stopped (06/18/19 2123)     Lab Results  Component Value Date   WBC 9.2 06/19/2019   HGB 8.1 (L) 06/19/2019   HCT 26.3 (L) 06/19/2019   PLT 116 (L) 06/19/2019   GLUCOSE 117 (H) 06/19/2019   NA 140 06/19/2019   K 3.9 06/19/2019   CL 110 06/19/2019   CREATININE 0.62 06/19/2019   BUN 8 06/19/2019   CO2 25 06/19/2019   INR 1.6 (H) 06/18/2019   HGBA1C 6.5 (H) 06/18/2019    No complaints Off Dopamine, good U/O Pleural Chest tubes remain-210 cc output Hgb stable at 8.1  Ellwood Handler, PA-C    Office [phone] 06/19/2019 7:47 PM   I have seen and examined the patient and agree with the assessment and plan as  outlined.  Rexene Alberts, MD 06/19/2019 7:52 PM

## 2019-06-19 NOTE — Op Note (Signed)
NAME: Kathleen Terrell, Kathleen Terrell. MEDICAL RECORD VF:[id number] ACCOUNT [id number] DATE OF BIRTH:September 24, 1946 FACILITY: [facility] LOCATION: [facility] PHYSICIAN:Leovanni Bjorkman Maryruth Bun, MD  OPERATIVE REPORT  DATE OF PROCEDURE:  06/18/2019  PREOPERATIVE DIAGNOSIS:  Three-vessel coronary artery disease with history of syncope.  POSTOPERATIVE DIAGNOSIS:  Three-vessel coronary artery disease with history of syncope.  SURGICAL PROCEDURE:  Coronary artery bypass grafting x3 with the left internal mammary to the left anterior descending coronary artery, reverse saphenous vein graft to the obtuse marginal coronary artery, reverse saphenous vein graft to the right  coronary artery with right thigh greater saphenous endoscopic vein harvesting.  SURGEON:  Lanelle Bal, MD  FIRST ASSISTANT:   Melodie Bouillon MD.    SECOND ASSISTANT:  Nicholes Rough, [place].  BRIEF HISTORY:  The patient is a 73 year old female who in early May had a syncopal or near syncopal episode while working in her yard at home.  She was assisted by her neighbors.  Ultimately, she was seen in the cardiology office and because of her  episode, further evaluation was done.  Cardiac catheterization was done by Dr. Irish Lack which demonstrated significant 3-vessel coronary artery disease with 95% ostial right coronary lesion, 75-80% ostial circumflex supplying a large obtuse marginal, a  relatively small LAD with ostial 80% and mid 80% disease.  Because of the patient's significant 3-vessel coronary artery disease, coronary artery bypass grafting was recommended to the patient who agreed and signed informed consent.  DESCRIPTION OF PROCEDURE:  With Swan-Ganz and arterial line monitors in place, the patient underwent general endotracheal anesthesia without incident.  The chest and legs were prepped with Betadine, draped in the usual sterile manner.  Appropriate  timeout was performed and we proceeded with endoscopic vein harvesting of the right greater saphenous  vein in the thigh.  The vein was of good quality and caliber.  Median sternotomy was performed.  Left internal mammary artery was dissected down as a  pedicle graft.  The distal artery was divided and had good free flow.  Pericardium was opened.  Overall, ventricular function appeared preserved.  The patient did have significant calcification along the lateral aspect of her aorta.  The distal ascending  aorta felt soft and we were able to cannulate the ascending aorta away from the calcific plaque.  A dual stage venous cannula was also placed after the patient had been systemically heparinized.  The patient was placed on cardiopulmonary bypass, 2.4  liters per minute per meter square.  Sites anastomosis were selected and dissected out of the epicardium.  An aortic root cardioplegia needle was introduced into the ascending aorta.  The patient's body temperature was cooled to 32 degrees.  Aortic  crossclamp was applied and 500 mL of cold blood potassium cardioplegia was administered with diastolic arrest of the heart.  Myocardial septal temperatures monitored throughout the crossclamp.  We turned our attention first to the distal right coronary  artery.  This vessel was a relatively small.  Vessel was opened, admitted 1.5 mm probe.  Using a running 7-0 Prolene, distal anastomosis was performed.  The heart was then elevated.  The distal circumflex proper had diffuse disease.  The obtuse marginal  was a larger vessel.  This vessel was opened and admitted 1.5 mm probe distally.  Using a running 7-0 Prolene, distal anastomosis was performed.  Additional cold blood cardioplegia was administered down the vein grafts.  We then turned our attention to  the left anterior descending coronary artery, which was opened in the midportion.  The vessel was  relatively small, but admitted a 1 mm probe distally.  The vessel was approximately 1.3 mm in size.  Using a running 8-0 Prolene, the left internal mammary  artery was  anastomosed to the left anterior descending coronary artery.  With cross clamp still in place, 2 punch aortotomies were performed, avoiding the thickened ascending aorta. Using running 6-0 Prolene suture.  The 2 vein grafts were anastomosed to  the ascending aorta.  The bulldog was removed from the mammary artery with prompt rise in myocardial septal temperature.  The heart was allowed to passively fill and deaired and the proximal anastomoses were completed and aortic crossclamp was removed  with total crossclamp time of 75 minutes.  The patient spontaneously converted to a sinus rhythm.  Sites anastomosis were inspected and were free of bleeding.  The patient's body temperature was rewarmed to 37 degrees.  Atrial and ventricular pacing  wires were applied.  She was then ventilated and weaned from cardiopulmonary bypass without difficulty.  TEE probe had been placed by Dr. Marcie Bal at the beginning of the case and showed good LV function with evidence of left ventricular hypertrophy.  She  remained hemodynamically stable and was decannulated in the usual fashion.  Protamine sulfate was administered with operative field hemostatic.   A left pleural tube and a Blake mediastinal drain were left in place.  The sternum was closed with #6  stainless steel wire.  Fascia closed with interrupted 0 Vicryl, running 3-0 Vicryl subcutaneous tissue, 3-0 subcuticular stitch in skin edges.  Dry dressings were applied.  Sponge and needle count was reported as correct at completion of the procedure.    The patient tolerated the procedure without obvious complication.    Total pump time was 103 minutes.    The patient did not require any blood bank blood products during the operative procedure.    RF tag scanning was reported clear code at the completion of the case.  AN/NUANCE  D:06/19/2019 T:06/19/2019 JOB:[id number]

## 2019-06-20 ENCOUNTER — Inpatient Hospital Stay (HOSPITAL_COMMUNITY): Payer: Medicare Other

## 2019-06-20 LAB — CBC
HCT: 25.1 % — ABNORMAL LOW (ref 36.0–46.0)
Hemoglobin: 7.7 g/dL — ABNORMAL LOW (ref 12.0–15.0)
MCH: 27.4 pg (ref 26.0–34.0)
MCHC: 30.7 g/dL (ref 30.0–36.0)
MCV: 89.3 fL (ref 80.0–100.0)
Platelets: 117 10*3/uL — ABNORMAL LOW (ref 150–400)
RBC: 2.81 MIL/uL — ABNORMAL LOW (ref 3.87–5.11)
RDW: 15.6 % — ABNORMAL HIGH (ref 11.5–15.5)
WBC: 9 10*3/uL (ref 4.0–10.5)
nRBC: 0 % (ref 0.0–0.2)

## 2019-06-20 LAB — GLUCOSE, CAPILLARY
Glucose-Capillary: 95 mg/dL (ref 70–99)
Glucose-Capillary: 91 mg/dL (ref 70–99)
Glucose-Capillary: 101 mg/dL — ABNORMAL HIGH (ref 70–99)
Glucose-Capillary: 82 mg/dL (ref 70–99)
Glucose-Capillary: 149 mg/dL — ABNORMAL HIGH (ref 70–99)
Glucose-Capillary: 79 mg/dL (ref 70–99)

## 2019-06-20 LAB — BASIC METABOLIC PANEL
Anion gap: 5 (ref 5–15)
BUN: 11 mg/dL (ref 8–23)
CO2: 26 mmol/L (ref 22–32)
Calcium: 8.2 mg/dL — ABNORMAL LOW (ref 8.9–10.3)
Chloride: 108 mmol/L (ref 98–111)
Creatinine, Ser: 0.82 mg/dL (ref 0.44–1.00)
GFR calc Af Amer: >60 mL/min (ref >60)
GFR calc non Af Amer: >60 mL/min (ref >60)
Glucose, Bld: 101 mg/dL — ABNORMAL HIGH (ref 70–99)
Potassium: 4.1 mmol/L (ref 3.5–5.1)
Sodium: 139 mmol/L (ref 135–145)

## 2019-06-20 MED ORDER — ONDANSETRON HCL 4 MG/2ML IJ SOLN: 4.0000 mg | Freq: Four times a day (QID) | INTRAMUSCULAR | Status: DC | PRN

## 2019-06-20 MED ORDER — SODIUM CHLORIDE 0.9% FLUSH
3.0000 mL | Freq: Two times a day (BID) | INTRAVENOUS | Status: DC
  Administered 2019-06-20 – 2019-06-22 (×6): 3 mL via INTRAVENOUS

## 2019-06-20 MED ORDER — ENOXAPARIN SODIUM 30 MG/0.3ML ~~LOC~~ SOLN
30.0000 mg | SUBCUTANEOUS | Status: DC
  Administered 2019-06-20 – 2019-06-23 (×4): 30 mg via SUBCUTANEOUS
  Filled 2019-06-20 (×4): qty 0.3

## 2019-06-20 MED ORDER — FOLIC ACID 1 MG PO TABS
1.0000 mg | ORAL_TABLET | Freq: Every day | ORAL | Status: DC
  Administered 2019-06-20 – 2019-06-23 (×4): 1 mg via ORAL
  Filled 2019-06-20 (×4): qty 1

## 2019-06-20 MED ORDER — SODIUM CHLORIDE 0.9 % IV SOLN: 250.0000 mL | INTRAVENOUS | Status: DC | PRN

## 2019-06-20 MED ORDER — ONDANSETRON HCL 4 MG PO TABS: 4.0000 mg | ORAL_TABLET | Freq: Four times a day (QID) | ORAL | Status: DC | PRN

## 2019-06-20 MED ORDER — CHLORHEXIDINE GLUCONATE CLOTH 2 % EX PADS: 6.0000 | MEDICATED_PAD | Freq: Every day | CUTANEOUS | Status: DC

## 2019-06-20 MED ORDER — SODIUM CHLORIDE 0.9% FLUSH: 3.0000 mL | INTRAVENOUS | Status: DC | PRN

## 2019-06-20 MED ORDER — METOPROLOL TARTRATE 12.5 MG HALF TABLET
12.5000 mg | ORAL_TABLET | Freq: Two times a day (BID) | ORAL | Status: DC
  Administered 2019-06-20 (×2): 12.5 mg via ORAL
  Filled 2019-06-20 (×2): qty 1

## 2019-06-20 MED ORDER — MOVING RIGHT ALONG BOOK
Freq: Once | Status: AC
  Administered 2019-06-20: 10:00:00
  Filled 2019-06-20: qty 1

## 2019-06-20 MED ORDER — PANTOPRAZOLE SODIUM 40 MG PO TBEC
40.0000 mg | DELAYED_RELEASE_TABLET | Freq: Every day | ORAL | Status: DC
  Administered 2019-06-20 – 2019-06-23 (×4): 40 mg via ORAL
  Filled 2019-06-20 (×4): qty 1

## 2019-06-20 MED ORDER — OXYCODONE HCL 5 MG PO TABS
5.0000 mg | ORAL_TABLET | ORAL | Status: DC | PRN
  Administered 2019-06-20 – 2019-06-23 (×10): 5 mg via ORAL
  Filled 2019-06-20: qty 2
  Filled 2019-06-20 (×9): qty 1

## 2019-06-20 MED ORDER — BISACODYL 10 MG RE SUPP: 10.0000 mg | Freq: Every day | RECTAL | Status: DC | PRN

## 2019-06-20 MED ORDER — ACETAMINOPHEN 325 MG PO TABS: 650.0000 mg | ORAL_TABLET | Freq: Four times a day (QID) | ORAL | Status: DC | PRN

## 2019-06-20 MED ORDER — DOCUSATE SODIUM 100 MG PO CAPS
200.0000 mg | ORAL_CAPSULE | Freq: Every day | ORAL | Status: DC
  Administered 2019-06-20 – 2019-06-23 (×4): 200 mg via ORAL
  Filled 2019-06-20 (×4): qty 2

## 2019-06-20 MED ORDER — ASPIRIN EC 325 MG PO TBEC
325.0000 mg | DELAYED_RELEASE_TABLET | Freq: Every day | ORAL | Status: DC
  Administered 2019-06-20 – 2019-06-23 (×4): 325 mg via ORAL
  Filled 2019-06-20 (×4): qty 1

## 2019-06-20 MED ORDER — BISACODYL 5 MG PO TBEC
10.0000 mg | DELAYED_RELEASE_TABLET | Freq: Every day | ORAL | Status: DC | PRN
  Administered 2019-06-23: 10 mg via ORAL
  Filled 2019-06-20: qty 2

## 2019-06-20 MED ORDER — INSULIN ASPART 100 UNIT/ML ~~LOC~~ SOLN
0.0000 [IU] | Freq: Three times a day (TID) | SUBCUTANEOUS | Status: DC
  Administered 2019-06-20: 2 [IU] via SUBCUTANEOUS

## 2019-06-20 MED ORDER — GUAIFENESIN ER 600 MG PO TB12: 600.0000 mg | ORAL_TABLET | Freq: Two times a day (BID) | ORAL | Status: DC | PRN

## 2019-06-20 NOTE — Progress Notes (Signed)
Patient ambulated in hallway with nursing staff 200 feet with walker. Patient tolerated well back in bed. Will monitor patient. Kathleen Terrell, Bettina Gavia RN

## 2019-06-20 NOTE — Progress Notes (Signed)
Patient ID: Kathleen Terrell, female   DOB: Apr 09, 1946, 73 y.o.   MRN: [id number] T[facility] DAILY ICU PROGRESS NOTE                   [address]            [place], [address]          [phone]   2 Days Post-Op Procedure(s) (LRB): CORONARY ARTERY BYPASS GRAFTING (CABG) x 3, ON PUMP, LIMA TO LAD, SVG TO RCA, SVG TO OM 1, USING LEFT INTERNAL MAMMARY ARTERY AND RIGHT GREATER SAPHENOUS VEIN HARVESTED ENDOSCOPICALLY (N/A) TRANSESOPHAGEAL ECHOCARDIOGRAM (TEE) (N/A)  Total Length of Stay:  LOS: 6 days   Subjective: Awake and alert, walked around unit holding sinus  Objective: Vital signs in last 24 hours: Temp:  [97.4 F (36.3 C)-99 F (37.2 C)] 98.4 F (36.9 C) (06/25 0400) Pulse Rate:  [65-120] 80 (06/25 0605) Cardiac Rhythm: Normal sinus rhythm (06/25 0355) Resp:  [9-28] 21 (06/25 0605) BP: (91-140)/(49-104) 129/63 (06/25 0605) SpO2:  [62 %-100 %] 100 % (06/25 0500) Arterial Line BP: (109-155)/(37-55) 151/48 (06/24 1730) Weight:  [83.8 kg] 83.8 kg (06/25 0500)  Filed Weights   06/18/19 0507 06/19/19 0500 06/20/19 0500  Weight: 80.2 kg 85.1 kg 83.8 kg    Weight change: -1.3 kg   Hemodynamic parameters for last 24 hours: PAP: (28-30)/(12-16) 29/14  Intake/Output from previous day: 06/24 0701 - 06/25 0700 In: 1145 [P.O.:360; I.V.:584.8; IV Piggyback:200.2] Out: 1035 [Urine:795; Chest Tube:240]  Intake/Output this shift: No intake/output data recorded.  Current Meds: Scheduled Meds: . acetaminophen  1,000 mg Oral Q6H   Or  . acetaminophen (TYLENOL) oral liquid 160 mg/5 mL  1,000 mg Per Tube Q6H  . aspirin EC  325 mg Oral Daily   Or  . aspirin  324 mg Per Tube Daily  . atorvastatin  80 mg Oral q1800  . bisacodyl  10 mg Oral Daily   Or  . bisacodyl  10 mg Rectal Daily  . Chlorhexidine Gluconate Cloth  6 each Topical Daily  . Chlorhexidine Gluconate Cloth  6 each Topical Daily  . docusate sodium  200 mg Oral Daily  . insulin aspart  0-24 Units  Subcutaneous Q4H  . levothyroxine  88 mcg Oral QAC breakfast  . mouth rinse  15 mL Mouth Rinse BID  . metoprolol tartrate  12.5 mg Oral BID   Or  . metoprolol tartrate  12.5 mg Per Tube BID  . pantoprazole  40 mg Oral Daily  . sodium chloride flush  3 mL Intravenous Q12H   Continuous Infusions: . sodium chloride Stopped (06/19/19 0919)  . sodium chloride    . sodium chloride 20 mL/hr at 06/19/19 1512  . dexmedetomidine (PRECEDEX) IV infusion Stopped (06/18/19 1557)  . DOPamine Stopped (06/19/19 1705)  . lactated ringers    . lactated ringers Stopped (06/18/19 1418)  . lactated ringers 20 mL/hr at 06/19/19 2000  . nitroGLYCERIN Stopped (06/18/19 1416)  . phenylephrine (NEO-SYNEPHRINE) Adult infusion Stopped (06/18/19 2123)   PRN Meds:.sodium chloride, fentaNYL (SUBLIMAZE) injection, lactated ringers, metoprolol tartrate, midazolam, ondansetron (ZOFRAN) IV, oxyCODONE, sodium chloride flush  General appearance: alert, cooperative and no distress Neurologic: intact Heart: regular rate and rhythm, S1, S2 normal, no murmur, click, rub or gallop Lungs: diminished breath sounds bibasilar Abdomen: soft, non-tender; bowel sounds normal; no masses,  no organomegaly Extremities: extremities normal, atraumatic, no cyanosis or edema and Homans sign is negative, no sign of DVT Wound: sternum  intact  Lab Results: CBC: Recent Labs    06/19/19 1552 06/20/19 0423  WBC 9.2 9.0  HGB 8.1* 7.7*  HCT 26.3* 25.1*  PLT 116* 117*   BMET:  Recent Labs    06/19/19 1552 06/20/19 0423  NA 140 139  K 3.9 4.1  CL 110 108  CO2 25 26  GLUCOSE 117* 101*  BUN 8 11  CREATININE 0.62 0.82  CALCIUM 8.2* 8.2*    CMET: Lab Results  Component Value Date   WBC 9.0 06/20/2019   HGB 7.7 (L) 06/20/2019   HCT 25.1 (L) 06/20/2019   PLT 117 (L) 06/20/2019   GLUCOSE 101 (H) 06/20/2019   NA 139 06/20/2019   K 4.1 06/20/2019   CL 108 06/20/2019   CREATININE 0.82 06/20/2019   BUN 11 06/20/2019   CO2 26  06/20/2019   INR 1.6 (H) 06/18/2019   HGBA1C 6.5 (H) 06/18/2019      PT/INR:  Recent Labs    06/18/19 1320  LABPROT 18.6*  INR 1.6*   Radiology: Dg Chest Port 1 View  Result Date: 06/20/2019 CLINICAL DATA:  Status post coronary bypass graft. EXAM: PORTABLE CHEST 1 VIEW COMPARISON:  Radiograph of June 19, 2019. FINDINGS: Stable cardiomediastinal silhouette. Status post coronary bypass graft. Left-sided pacemaker is noted without pneumothorax. Right internal jugular Swan-Ganz catheter has been removed. Venous sheath remains. Minimal bibasilar subsegmental atelectasis is noted. Bony thorax is unremarkable. IMPRESSION: Stable position of left-sided chest tube without pneumothorax. Minimal bibasilar subsegmental atelectasis is noted. Electronically Signed   By: Marijo Conception M.D.   On: 06/20/2019 07:36   I have independently reviewed the above radiology studies  and reviewed the findings with the patient.     Assessment/Plan: S/P Procedure(s) (LRB): CORONARY ARTERY BYPASS GRAFTING (CABG) x 3, ON PUMP, LIMA TO LAD, SVG TO RCA, SVG TO OM 1, USING LEFT INTERNAL MAMMARY ARTERY AND RIGHT GREATER SAPHENOUS VEIN HARVESTED ENDOSCOPICALLY (N/A) TRANSESOPHAGEAL ECHOCARDIOGRAM (TEE) (N/A) Mobilize Diuresis Plan for transfer to step-down: see transfer orders     Grace Isaac 06/20/2019 7:46 AM

## 2019-06-20 NOTE — Progress Notes (Signed)
Spoke with patient daughter [name] and all questions answered. Will monitor patient. Mafalda Mcginniss, Bettina Gavia rN

## 2019-06-20 NOTE — Progress Notes (Signed)
Pt spoke with daughter via Facetime on Ipad.

## 2019-06-20 NOTE — Progress Notes (Signed)
Patient arrived from 2 heart to 4e15. Patient placed on monitor and vital signs obtained and CHG done. Will monitor patient. Jahziel Sinn, Bettina Gavia rN

## 2019-06-21 ENCOUNTER — Inpatient Hospital Stay (HOSPITAL_COMMUNITY): Payer: Medicare Other

## 2019-06-21 LAB — CBC
HCT: 25.9 % — ABNORMAL LOW (ref 36.0–46.0)
Hemoglobin: 8.1 g/dL — ABNORMAL LOW (ref 12.0–15.0)
MCH: 27.4 pg (ref 26.0–34.0)
MCHC: 31.3 g/dL (ref 30.0–36.0)
MCV: 87.5 fL (ref 80.0–100.0)
Platelets: 140 10*3/uL — ABNORMAL LOW (ref 150–400)
RBC: 2.96 MIL/uL — ABNORMAL LOW (ref 3.87–5.11)
RDW: 15.5 % (ref 11.5–15.5)
WBC: 8.2 10*3/uL (ref 4.0–10.5)
nRBC: 0 % (ref 0.0–0.2)

## 2019-06-21 LAB — BASIC METABOLIC PANEL
Anion gap: 9 (ref 5–15)
BUN: 9 mg/dL (ref 8–23)
CO2: 26 mmol/L (ref 22–32)
Calcium: 8.4 mg/dL — ABNORMAL LOW (ref 8.9–10.3)
Chloride: 104 mmol/L (ref 98–111)
Creatinine, Ser: 0.76 mg/dL (ref 0.44–1.00)
GFR calc Af Amer: >60 mL/min (ref >60)
GFR calc non Af Amer: >60 mL/min (ref >60)
Glucose, Bld: 103 mg/dL — ABNORMAL HIGH (ref 70–99)
Potassium: 4.2 mmol/L (ref 3.5–5.1)
Sodium: 139 mmol/L (ref 135–145)

## 2019-06-21 LAB — GLUCOSE, CAPILLARY
Glucose-Capillary: 94 mg/dL (ref 70–99)
Glucose-Capillary: 89 mg/dL (ref 70–99)
Glucose-Capillary: 44 mg/dL — CL (ref 70–99)
Glucose-Capillary: 66 mg/dL — ABNORMAL LOW (ref 70–99)
Glucose-Capillary: 106 mg/dL — ABNORMAL HIGH (ref 70–99)

## 2019-06-21 MED ORDER — POTASSIUM CHLORIDE CRYS ER 20 MEQ PO TBCR
20.0000 meq | EXTENDED_RELEASE_TABLET | Freq: Every day | ORAL | Status: AC
  Administered 2019-06-21 – 2019-06-23 (×3): 20 meq via ORAL
  Filled 2019-06-21 (×3): qty 1

## 2019-06-21 MED ORDER — FUROSEMIDE 40 MG PO TABS
40.0000 mg | ORAL_TABLET | Freq: Every day | ORAL | Status: AC
  Administered 2019-06-21 – 2019-06-23 (×3): 40 mg via ORAL
  Filled 2019-06-21: qty 2
  Filled 2019-06-21 (×2): qty 1

## 2019-06-21 MED ORDER — METOPROLOL TARTRATE 25 MG PO TABS
25.0000 mg | ORAL_TABLET | Freq: Two times a day (BID) | ORAL | Status: DC
  Administered 2019-06-21 – 2019-06-23 (×5): 25 mg via ORAL
  Filled 2019-06-21 (×5): qty 1

## 2019-06-21 MED FILL — Electrolyte-R (PH 7.4) Solution: INTRAVENOUS | Qty: 3000 | Status: AC

## 2019-06-21 MED FILL — Sodium Bicarbonate IV Soln 8.4%: INTRAVENOUS | Qty: 50 | Status: AC

## 2019-06-21 MED FILL — Mannitol IV Soln 20%: INTRAVENOUS | Qty: 500 | Status: AC

## 2019-06-21 MED FILL — Lidocaine HCl(Cardiac) IV PF Soln Pref Syr 100 MG/5ML (2%): INTRAVENOUS | Qty: 5 | Status: AC

## 2019-06-21 MED FILL — Sodium Chloride IV Soln 0.9%: INTRAVENOUS | Qty: 2000 | Status: AC

## 2019-06-21 NOTE — Care Management Important Message (Signed)
Important Message  Patient Details  Name: Kathleen Terrell MRN: [id number] Date of Birth: 06-04-1946   Medicare Important Message Given:  Yes     Orbie Pyo 06/21/2019, 3:05 PM

## 2019-06-21 NOTE — Discharge Instructions (Signed)

## 2019-06-21 NOTE — Progress Notes (Signed)
CBG checked as scheduled. CBG result 44. Pt asymptomatic, sitting up eating. CBG rechecked with different glucometer with result of 79.   [name], RN

## 2019-06-21 NOTE — Progress Notes (Signed)
EPW removed per order. VSS. Tips intact. Pt educated on 1 hour bedrest. Call light in reach. Will continue to monitor.  [name], RN

## 2019-06-21 NOTE — Plan of Care (Signed)
  Problem: Health Behavior/Discharge Planning: Goal: Ability to manage health-related needs will improve Outcome: Progressing   Problem: Clinical Measurements: Goal: Diagnostic test results will improve Outcome: Progressing Goal: Cardiovascular complication will be avoided Outcome: Progressing

## 2019-06-21 NOTE — Progress Notes (Signed)
CARDIAC REHAB PHASE I   PRE:  Rate/Rhythm: 100 ST  BP:  Sitting: 122/71      SaO2: 96 RA  MODE:  Ambulation: 470 ft   POST:  Rate/Rhythm: 108 ST  BP:  Sitting: 120/66    SaO2: 100 RA   Pt ambulated 425ft in hallway standby assist with front wheel walker. Pt took several short standing rest breaks c/o some SOB, and "a touch" of lightheadedness. Pt returned to recliner. Began d/c ed with pt. Several interruptions. Left in-the-tube sheet, exercise guidelines, heart healthy and diabetic diet sheets at bedside. Encouraged pt to continue IS use and walks. Will continue to follow.  [id number] Rufina Falco, RN BSN 06/21/2019 10:36 AM

## 2019-06-21 NOTE — Discharge Summary (Signed)
Physician Discharge Summary        [address]       [place] [address]             [id number]        Patient ID: Kathleen Terrell MRN: [id number] DOB/AGE: 1946/08/06 73 y.o.  Admit date: 06/14/2019 Discharge date: 06/23/2019  Admission Diagnoses:  Patient Active Problem List   Diagnosis Date Noted  . CAD (coronary artery disease) 06/14/2019  . Colon adenomas   . Heme + stool 03/22/2017  . History of colonic polyps 03/22/2017  . Spondylolisthesis of lumbar region 09/06/2016  . Diverticulitis 11/24/2011    Discharge Diagnoses:  Active Problems:   CAD (coronary artery disease)   S/P CABG (coronary artery bypass graft) Hypertension Dyslipidemia Hypothyroid   Discharged Condition: good  HPI:   Kathleen Terrell is a 73 year old female with a past medical history significant for diverticulitis, hx of colonic polyps, previous tobacco abuse-quit in 1985, spondylolisthesis of the lumbar region, and hx of heme + stool who presented to Dr. Hassell Done office on 6/1 with previous syncope back in March while working in her yard. She was scheduled for a coronary CT at this time and encouraged to continue with daily exercise and to reduce her salt intake. The CT was performed and showed evidence of 3 vessel CAD. She was then scheduled for a cardiac catheterization. She underwent a cardiac catheterization today which showed 70% stenosis of the ostial to proximal circumflex, 75% stenosis of the proximal to mid circumflex, 70% stenosis of the ostial LAD to proximal LAD, 95% stenosis of the ostial RCA to proximal RCA, mid LAD stenosis of 80%, and 75% stenosis of the second diagonal lesion.  Left ventricular ejection fraction is 55 to 65%. She has not had an Echocardiogram recently. She has not had issues prior to March of this year. She gets around the house okay and is able to take care of herself. We are consulted for possible coronary revascularization.   She has a strong family  history of CAD, including her brother who was on the phone during my interview with the patient.    Hospital Course:   Kathleen Terrell underwent a coronary artery bypass grafting x3 on 06/18/2019 with Dr. Servando Snare and Dr. Kipp Brood assisting.  She tolerated the procedure well and was transferred to the surgical ICU for continued care.  She was explained in time manner. POD 1 she remains on dopamine.  We discontinued her Swan-Ganz catheter, arterial line, but continued her chest tubes due to output.  We encouraged her to move out of the bed to the chair.  She was doing well postop day 1.  Postop day 2 we mobilize the patient.  We initiated diuresis for fluid overload.  Postop day 3 she did have some sinus tachycardia which we increased her metoprolol.  Since her rhythm was stable we were able to discontinue her epicardial pacing wires.  We added Lasix for fluid overload.  We continue to encourage incentive spirometry.  The patient was weaned off of oxygen support.  Her blood glucose level was well controlled on sliding scale insulin and Levemir.  We continued her Lovenox for DVT prophylaxis.  She did have some constipation therefore we continued her on her stool softener and added prune juice to her regimen.  Her chest x-ray remained stable.  Today, she is tolerating room air, ambulating with limited assistance, her incision is healing well, she is ready for discharge home with  family.  Consults: None  Significant Diagnostic Studies:    CLINICAL DATA:  Status post CABG.  EXAM: CHEST - 2 VIEW  COMPARISON:  Chest x-ray from yesterday.  FINDINGS: Interval removal of the right internal jugular sheath and left-sided chest tube. Stable cardiomediastinal silhouette status post CABG. Atherosclerotic calcification of the aortic arch. Normal pulmonary vascularity. Minimal bibasilar atelectasis and small left pleural effusion are unchanged. No consolidation or pneumothorax. No acute osseous  abnormality.  IMPRESSION: 1. Unchanged minimal bibasilar atelectasis and small left pleural effusion. 2. Interval removal of the left-sided chest tube.  No pneumothorax.   Electronically Signed   By: Titus Dubin M.D.   On: 06/21/2019 09:00   Treatments:   NAME: Kathleen Terrell. MEDICAL RECORD VV:[id number] ACCOUNT [id number] DATE OF BIRTH:16-Mar-1946 FACILITY: [facility] LOCATION: [facility] PHYSICIAN:EDWARD Maryruth Bun, MD  OPERATIVE REPORT  DATE OF PROCEDURE:  06/18/2019  PREOPERATIVE DIAGNOSIS:  Three-vessel coronary artery disease with history of syncope.  POSTOPERATIVE DIAGNOSIS:  Three-vessel coronary artery disease with history of syncope.  SURGICAL PROCEDURE:  Coronary artery bypass grafting x3 with the left internal mammary to the left anterior descending coronary artery, reverse saphenous vein graft to the obtuse marginal coronary artery, reverse saphenous vein graft to the right  coronary artery with right thigh greater saphenous endoscopic vein harvesting.  SURGEON:  Lanelle Bal, MD  FIRST ASSISTANT:   Melodie Bouillon MD.    SECOND ASSISTANT:  Nicholes Rough, [place].    Discharge Exam: Blood pressure 135/81, pulse 97, temperature 98.5 F (36.9 C), temperature source Oral, resp. rate 18, height 5' 3.5" (1.613 m), weight 83.4 kg, SpO2 97 %.    General appearance:cooperative, alert, no distress Heart:sinus tachycardia, mild at ~100-105 Lungs:clear to auscultation bilaterally Abdomen:soft, non-tender; bowel sounds normal; no masses, no organomegaly Extremities:well perfused, 1+LE edema Wound:clean and dry   Disposition:   Discharge Instructions    Amb Referral to Cardiac Rehabilitation   Complete by: As directed    Diagnosis: CABG   CABG X ___: 3   After initial evaluation and assessments completed: Virtual Based Care may be provided alone or in conjunction with Phase 2 Cardiac Rehab based on patient barriers.: Yes     Allergies as of  06/23/2019      Reactions   Methionine Other (See Comments)   UNSPECIFIED REACTION  Pt not sure   Darvocet [propoxyphene N-acetaminophen] Nausea Only, Other (See Comments)   Latex Rash   Ultram [tramadol Hcl] Nausea And Vomiting      Medication List    STOP taking these medications   acetaminophen 650 MG CR tablet Commonly known as: TYLENOL   lisinopril-hydrochlorothiazide 20-12.5 MG tablet Commonly known as: ZESTORETIC   naproxen sodium 220 MG tablet Commonly known as: ALEVE   OVER THE COUNTER MEDICATION     TAKE these medications   furosemide 40 MG tablet Commonly known as: LASIX Take 1 tablet (40 mg total) by mouth daily for 7 days.   hydrocortisone 2.5 % rectal cream Commonly known as: Proctozone-HC Place 1 application rectally 4 (four) times daily. UP TO 10 DAYS FOR RECTAL PAIN/BLEEDING   levothyroxine 88 MCG tablet Commonly known as: SYNTHROID Take 88 mcg by mouth daily before breakfast.   metoprolol tartrate 25 MG tablet Commonly known as: LOPRESSOR Take 2 tablets (50 mg total) by mouth 2 (two) times daily. What changed:   how much to take  how to take this  when to take this  additional instructions   MULTIVITAMIN PO Take 1 tablet by  mouth daily.   oxyCODONE-acetaminophen 5-325 MG tablet Commonly known as: Percocet Take 1 tablet by mouth every 4 (four) hours as needed for up to 7 days for severe pain.   potassium chloride SA 20 MEQ tablet Commonly known as: K-DUR Take 1 tablet (20 mEq total) by mouth daily for 7 days.   pravastatin 40 MG tablet Commonly known as: PRAVACHOL Take 40 mg by mouth daily.   PROBIOTIC FORMULA PO Take 1 capsule by mouth daily.   TART CHERRY ADVANCED PO Take 1 capsule by mouth daily.   TURMERIC CURCUMIN PO Take 1 capsule by mouth daily.   Vitamin D3 50 MCG (2000 UT) Tabs Take 2,000 Units by mouth daily.            Durable Medical Equipment  (From admission, onward)         Start     Ordered    06/21/19 1242  For home use only DME 3 n 1  Once     06/21/19 1241   06/21/19 1241  For home use only DME Walker rolling  Once    Question:  Patient needs a walker to treat with the following condition  Answer:  S/P CABG x 3   06/21/19 1240   06/21/19 1034  For home use only DME Walker rolling  Once    Comments: Post op  Question:  Patient needs a walker to treat with the following condition  Answer:  Weakness   06/21/19 1033         Follow-up Information    Rory Percy, MD. Call in 1 day(s).   Specialty: Family Medicine Contact information: [place] [address] [phone]        Tommie Raymond, NP Follow up.   Specialty: Cardiology Why: Your follow-up appointment is on 07/08/2019 at 2:45pm. Please bring your medication list.  Contact information: [place] [address] [phone]        [facility] Follow up.   Specialty: Cardiothoracic Surgery Why: Your appointment is on 07/22/2019 at 1pm. Please arrive at 12:30pm for a chest xray at [facility] which is on the first floor of our building.  Contact information: [place], [place] [phone]         The patient has been discharged on:   1.Beta Blocker:  Yes [ yes  ]                              No   [   ]                              If No, reason:  2.Ace Inhibitor/ARB: Yes [   ]                                     No  [ no   ]                                     If No, reason: Titrating B-blocker, will resume as outpatient when appropriate  3.Statin:   Yes [ yes  ]  No  [   ]                  If No, reason:  4.Ecasa:  Yes  [ yes  ]                  No   [   ]                  If No, reason:   Signed: Antony Odea, PA-C 06/23/2019, 8:52 AM

## 2019-06-21 NOTE — Consult Note (Signed)
   [facility] Inpatient Consult   06/21/2019  Kathleen Terrell May 06, 1946 [id number]   Patient screened for low risk score for unplanned readmission score  for hospitalizations.  Patient in the [employer].  Patient transferred out of ICU under progressive care and reviewed for long length of stay in [place].  Review of patient's medical record reveals patient is patient underwent a CABG.  Primary Care Provider is  Dr. Rory Percy     Plan:  Notified inpatient [facility] RNCM that patient is in network and eligible for services. Currently, no needs noted. Will likely follow with General EMMI calls.   Please place a [facility] Care Management consult as appropriate and for questions contact:   [name] Brood, RN BSN [facility] Liaison  [phone] business mobile phone Toll free office [phone]  Fax number: [phone] [email or web address]

## 2019-06-21 NOTE — Progress Notes (Signed)
Pt daughter, [name], updated on pt plan of care via phone.  [name], RN

## 2019-06-21 NOTE — Progress Notes (Addendum)
[address]       [place] [address]             [phone]      3 Days Post-Op Procedure(s) (LRB): CORONARY ARTERY BYPASS GRAFTING (CABG) x 3, ON PUMP, LIMA TO LAD, SVG TO RCA, SVG TO OM 1, USING LEFT INTERNAL MAMMARY ARTERY AND RIGHT GREATER SAPHENOUS VEIN HARVESTED ENDOSCOPICALLY (N/A) TRANSESOPHAGEAL ECHOCARDIOGRAM (TEE) (N/A) Subjective: Feels okay this morning. She has not had a bowel movement yet since surgery  Objective: Vital signs in last 24 hours: Temp:  [98.3 F (36.8 C)-99.3 F (37.4 C)] 98.8 F (37.1 C) (06/26 0747) Pulse Rate:  [28-109] 28 (06/26 0747) Cardiac Rhythm: Normal sinus rhythm;Sinus tachycardia (06/25 2100) Resp:  [11-25] 22 (06/26 0747) BP: (103-147)/(62-91) 147/62 (06/26 0747) SpO2:  [93 %-100 %] 98 % (06/26 0747) Weight:  [86.2 kg] 86.2 kg (06/26 0439)     Intake/Output from previous day: 06/25 0701 - 06/26 0700 In: 118 [P.O.:118] Out: 30 [Chest Tube:30] Intake/Output this shift: Total I/O In: 240 [P.O.:240] Out: -   General appearance: cooperative, alert, no distress Heart: sinus tachycardia Lungs: clear to auscultation bilaterally Abdomen: soft, non-tender; bowel sounds normal; no masses,  no organomegaly Extremities: extremities normal, atraumatic, no cyanosis or edema Wound: clean and dry  Lab Results: Recent Labs    06/20/19 0423 06/21/19 0645  WBC 9.0 8.2  HGB 7.7* 8.1*  HCT 25.1* 25.9*  PLT 117* 140*   BMET:  Recent Labs    06/20/19 0423 06/21/19 0645  NA 139 139  K 4.1 4.2  CL 108 104  CO2 26 26  GLUCOSE 101* 103*  BUN 11 9  CREATININE 0.82 0.76  CALCIUM 8.2* 8.4*    PT/INR:  Recent Labs    06/18/19 1320  LABPROT 18.6*  INR 1.6*   ABG    Component Value Date/Time   PHART 7.320 (L) 06/18/2019 1821   HCO3 22.9 06/18/2019 1821   TCO2 25 06/18/2019 1925   ACIDBASEDEF 3.0 (H) 06/18/2019 1821   O2SAT 97.0 06/18/2019 1821   CBG (last 3)  Recent Labs    06/20/19 1736  06/20/19 2116 06/21/19 0624  GLUCAP 149* 79 94    Assessment/Plan: S/P Procedure(s) (LRB): CORONARY ARTERY BYPASS GRAFTING (CABG) x 3, ON PUMP, LIMA TO LAD, SVG TO RCA, SVG TO OM 1, USING LEFT INTERNAL MAMMARY ARTERY AND RIGHT GREATER SAPHENOUS VEIN HARVESTED ENDOSCOPICALLY (N/A) TRANSESOPHAGEAL ECHOCARDIOGRAM (TEE) (N/A)  1. CV- sinus tachycardia-increase metoprolol to 25mg  BID, BP well controlled. Continue ASA, statin.  2. Pulm-chest xray stable. Tolerating room air with good oxygen saturation. Continue to encourage incentive spirometer.   3. Renal-creatinine 0.76, potassium 4.2.  Weight is up 4kg from her baseline. Will start PO Lasix 40mg  daily.  4. Endo-continue SSI and Levemir, well controlled 5. H and H stable. Continue to trend 6. Continue Lovenox for DVT prophylaxis 7. Constipation- on colace and also wants to order prune juice today which usually works for her  Plan: Discontinue EPW since rhythm has been stable. Added Lasix and increased Metoprolol to 25mg  BID. Continue ambulation in the hall. Sternotomy incision looks good. Continue to encourage incentive spirometer. Likely home this weekend if remains stable.    LOS: 7 days    Elgie Collard 06/21/2019  Poss home on Sunday  I have seen and examined [name] and agree with the above assessment  and plan.  Grace Isaac MD [facility] [phone] Office [phone] 06/21/2019  1:00 PM

## 2019-06-22 LAB — GLUCOSE, CAPILLARY
Glucose-Capillary: 101 mg/dL — ABNORMAL HIGH (ref 70–99)
Glucose-Capillary: 96 mg/dL (ref 70–99)
Glucose-Capillary: 85 mg/dL (ref 70–99)
Glucose-Capillary: 98 mg/dL (ref 70–99)

## 2019-06-22 NOTE — Progress Notes (Signed)
Pt ambulated around unit x 470 feet, pt tolerated well 

## 2019-06-22 NOTE — Progress Notes (Signed)
Pt ambulated x 470 feet around unit tolerated well

## 2019-06-22 NOTE — Progress Notes (Signed)
CARDIAC REHAB PHASE I   PRE:  Rate/Rhythm: 110 ST  BP:  Sitting: 144/68  MODE:  Ambulation: 470 ft   POST:  Rate/Rhythm: 115 ST  BP:  Sitting: 139/71     Pt ambulated 470 ft with RW. Pt had steady gait. Pt denied any complaints of CP, dizziness, or SOB. Reviewed IS use, exercise guidelines and restrictions. Pt demonstrated good sternal precautions. Pt returned to recliner with feet elevated. Will send CR Phase II referral to [facility]. Call bell within reach.   [phone]  Carma Lair MS, ACSM CEP  12:48 PM 06/22/2019

## 2019-06-22 NOTE — TOC Initial Note (Signed)
Transition of Care [facility]) - Initial/Assessment Note    Patient Details  Name: Kathleen Terrell MRN: [id number] Date of Birth: [birth year]  [facility] CM/SW Contact:    Apolonio Schneiders, RN Phone Number: 06/22/2019, 12:26 PM  Clinical Narrative:                 CM spoke with the patient at the bedside. Patient states she lives alone. She states her neighbor is able to assist her. Her children live in [place] and [place]. She will be going to stay with her daughter when she is discharged. Patient has a order for a 3N1 and RW. James at [facility] notified and will deliver the patient's room.   Expected Discharge Plan: Home/Self Care Barriers to Discharge: Continued Medical Work up   Patient Goals and CMS Choice        Expected Discharge Plan and Services Expected Discharge Plan: Home/Self Care   Discharge Planning Services: CM Consult                     DME Arranged: 3-N-1, Walker rolling DME Agency: [employer] Date DME Agency Contacted: 06/22/19   Representative spoke with at DME Agency: Jeneen Rinks            Prior Living Arrangements/Services   Lives with:: Self                   Activities of Daily Living Home Assistive Devices/Equipment: None ADL Screening (condition at time of admission) Patient's cognitive ability adequate to safely complete daily activities?: Yes Is the patient deaf or have difficulty hearing?: No Does the patient have difficulty seeing, even when wearing glasses/contacts?: No Does the patient have difficulty concentrating, remembering, or making decisions?: No Patient able to express need for assistance with ADLs?: Yes Does the patient have difficulty dressing or bathing?: No Independently performs ADLs?: Yes (appropriate for developmental age) Does the patient have difficulty walking or climbing stairs?: No Weakness of Legs: None Weakness of Arms/Hands: None  Permission Sought/Granted                   Emotional Assessment              Admission diagnosis:  CAD (coronary artery disease) [I25.10] Patient Active Problem List   Diagnosis Date Noted  . S/P CABG (coronary artery bypass graft) 06/18/2019  . CAD (coronary artery disease) 06/14/2019  . Colon adenomas   . Heme + stool 03/22/2017  . History of colonic polyps 03/22/2017  . Spondylolisthesis of lumbar region 09/06/2016  . Diverticulitis 11/24/2011   PCP:  Rory Percy, MD Pharmacy:   [facility] [address] [place] [address] Phone: [phone] Fax: [phone]     Social Determinants of Health (SDOH) Interventions    Readmission Risk Interventions No flowsheet data found.

## 2019-06-22 NOTE — Progress Notes (Addendum)
4 Days Post-Op Procedure(s) (LRB): CORONARY ARTERY BYPASS GRAFTING (CABG) x 3, ON PUMP, LIMA TO LAD, SVG TO RCA, SVG TO OM 1, USING LEFT INTERNAL MAMMARY ARTERY AND RIGHT GREATER SAPHENOUS VEIN HARVESTED ENDOSCOPICALLY (N/A) TRANSESOPHAGEAL ECHOCARDIOGRAM (TEE) (N/A) Subjective: Up in the bedside chair after walking in the hall this morning. Says she feels well and has no concerns.  Objective: Vital signs in last 24 hours: Temp:  [98.6 F (37 C)-99.9 F (37.7 C)] 98.8 F (37.1 C) (06/27 0756) Pulse Rate:  [89-110] 110 (06/27 0756) Cardiac Rhythm: Normal sinus rhythm (06/27 0701) Resp:  [14-24] 24 (06/27 0756) BP: (118-143)/(57-80) 135/80 (06/27 0756) SpO2:  [94 %-99 %] 98 % (06/27 0756) Weight:  [84.6 kg] 84.6 kg (06/27 0445)     Intake/Output from previous day: 06/26 0701 - 06/27 0700 In: 480 [P.O.:480] Out: -  Intake/Output this shift: No intake/output data recorded.  General appearance: cooperative, alert, no distress Heart: sinus tachycardia, mild at ~100-105 Lungs: clear to auscultation bilaterally Abdomen: soft, non-tender; bowel sounds normal; no masses,  no organomegaly Extremities: extremities normal, atraumatic, no cyanosis or edema Wound: clean and dry  Lab Results: Recent Labs    06/20/19 0423 06/21/19 0645  WBC 9.0 8.2  HGB 7.7* 8.1*  HCT 25.1* 25.9*  PLT 117* 140*   BMET:  Recent Labs    06/20/19 0423 06/21/19 0645  NA 139 139  K 4.1 4.2  CL 108 104  CO2 26 26  GLUCOSE 101* 103*  BUN 11 9  CREATININE 0.82 0.76  CALCIUM 8.2* 8.4*    PT/INR: No results for input(s): LABPROT, INR in the last 72 hours. ABG    Component Value Date/Time   PHART 7.320 (L) 06/18/2019 1821   HCO3 22.9 06/18/2019 1821   TCO2 25 06/18/2019 1925   ACIDBASEDEF 3.0 (H) 06/18/2019 1821   O2SAT 97.0 06/18/2019 1821   CBG (last 3)  Recent Labs    06/21/19 1843 06/21/19 2219 06/22/19 0606  GLUCAP 66* 106* 101*    Assessment/Plan: S/P Procedure(s)  (LRB): CORONARY ARTERY BYPASS GRAFTING (CABG) x 3, ON PUMP, LIMA TO LAD, SVG TO RCA, SVG TO OM 1, USING LEFT INTERNAL MAMMARY ARTERY AND RIGHT GREATER SAPHENOUS VEIN HARVESTED ENDOSCOPICALLY (N/A) TRANSESOPHAGEAL ECHOCARDIOGRAM (TEE) (N/A)  1. CV- sinus tachycardia-continue metoprolol to 25mg  BID for now, BP well controlled. Continue ASA, statin.  2. Pulm-chest xray stable. Tolerating room air with good oxygen saturation.3. Renal-creatinine 0.76, potassium 4.2.  Diuresed with PO Lasix 40mg  daily, lost 2kg yesterday.  4. Endo-continue SSI and Levemir, well controlled 5. H and H  Trending up 6. Continue Lovenox for DVT prophylaxis  Anticipate discharge in AM.    LOS: 8 days    Antony Odea, PA-C [phone] 06/22/2019  patient examined and medical record reviewed,agree with above note. Tharon Aquas Trigt III 06/22/2019

## 2019-06-23 LAB — BASIC METABOLIC PANEL
Anion gap: 8 (ref 5–15)
BUN: 9 mg/dL (ref 8–23)
CO2: 28 mmol/L (ref 22–32)
Calcium: 8.7 mg/dL — ABNORMAL LOW (ref 8.9–10.3)
Chloride: 102 mmol/L (ref 98–111)
Creatinine, Ser: 0.68 mg/dL (ref 0.44–1.00)
GFR calc Af Amer: >60 mL/min (ref >60)
GFR calc non Af Amer: >60 mL/min (ref >60)
Glucose, Bld: 99 mg/dL (ref 70–99)
Potassium: 3.8 mmol/L (ref 3.5–5.1)
Sodium: 138 mmol/L (ref 135–145)

## 2019-06-23 LAB — GLUCOSE, CAPILLARY: Glucose-Capillary: 101 mg/dL — ABNORMAL HIGH (ref 70–99)

## 2019-06-23 MED ORDER — METOPROLOL TARTRATE 25 MG PO TABS: 50.0000 mg | ORAL_TABLET | Freq: Two times a day (BID) | ORAL | 2 refills | Status: DC

## 2019-06-23 MED ORDER — OXYCODONE-ACETAMINOPHEN 5-325 MG PO TABS: 1.0000 | ORAL_TABLET | ORAL | 0 refills | Status: AC | PRN

## 2019-06-23 MED ORDER — FUROSEMIDE 40 MG PO TABS: 40.0000 mg | ORAL_TABLET | Freq: Every day | ORAL | 0 refills | Status: DC

## 2019-06-23 MED ORDER — POTASSIUM CHLORIDE CRYS ER 20 MEQ PO TBCR: 20.0000 meq | EXTENDED_RELEASE_TABLET | Freq: Every day | ORAL | 0 refills | Status: DC

## 2019-06-23 NOTE — Progress Notes (Addendum)
Patient in a stable condition discharge education reviewed with patient and her daughter over the phone. They verbalized understanding, iv removed, tele dc ccmd notified, patient belongings and equipment for home use at bedside, patient to be transported home by her daughter. Sutures removed.

## 2019-06-23 NOTE — Plan of Care (Signed)
Care plan goals ompleted

## 2019-06-23 NOTE — Progress Notes (Signed)
5 Days Post-Op Procedure(s) (LRB): CORONARY ARTERY BYPASS GRAFTING (CABG) x 3, ON PUMP, LIMA TO LAD, SVG TO RCA, SVG TO OM 1, USING LEFT INTERNAL MAMMARY ARTERY AND RIGHT GREATER SAPHENOUS VEIN HARVESTED ENDOSCOPICALLY (N/A) TRANSESOPHAGEAL ECHOCARDIOGRAM (TEE) (N/A) Subjective: Fells good, no new concerns. She said she feels ready to be discharged to her daughter's home today.  Objective: Vital signs in last 24 hours: Temp:  [98 F (36.7 C)-98.8 F (37.1 C)] 98.5 F (36.9 C) (06/28 0753) Pulse Rate:  [86-105] 97 (06/28 0753) Cardiac Rhythm: Normal sinus rhythm (06/28 0701) Resp:  [13-20] 18 (06/28 0753) BP: (122-148)/(67-81) 135/81 (06/28 0753) SpO2:  [96 %-98 %] 97 % (06/28 0753) Weight:  [83.4 kg] 83.4 kg (06/28 0448)     Intake/Output from previous day: 06/27 0701 - 06/28 0700 In: 750 [P.O.:750] Out: -  Intake/Output this shift: No intake/output data recorded.  General appearance:cooperative, alert, no distress Heart:sinus tachycardia, mild at ~100-105 Lungs:clear to auscultation bilaterally Abdomen:soft, non-tender; bowel sounds normal; no masses, no organomegaly Extremities:well perfused, 1+LE edema Wound:clean and dry  Lab Results: Recent Labs    06/21/19 0645  WBC 8.2  HGB 8.1*  HCT 25.9*  PLT 140*   BMET:  Recent Labs    06/21/19 0645 06/23/19 0356  NA 139 138  K 4.2 3.8  CL 104 102  CO2 26 28  GLUCOSE 103* 99  BUN 9 9  CREATININE 0.76 0.68  CALCIUM 8.4* 8.7*    PT/INR: No results for input(s): LABPROT, INR in the last 72 hours. ABG    Component Value Date/Time   PHART 7.320 (L) 06/18/2019 1821   HCO3 22.9 06/18/2019 1821   TCO2 25 06/18/2019 1925   ACIDBASEDEF 3.0 (H) 06/18/2019 1821   O2SAT 97.0 06/18/2019 1821   CBG (last 3)  Recent Labs    06/22/19 1644 06/22/19 2213 06/23/19 0625  GLUCAP 85 98 101*    Assessment/Plan: S/P Procedure(s) (LRB): CORONARY ARTERY BYPASS GRAFTING (CABG) x 3, ON PUMP, LIMA TO LAD, SVG TO  RCA, SVG TO OM 1, USING LEFT INTERNAL MAMMARY ARTERY AND RIGHT GREATER SAPHENOUS VEIN HARVESTED ENDOSCOPICALLY (N/A) TRANSESOPHAGEAL ECHOCARDIOGRAM (TEE) (N/A)  POD-5 CABG-mild sinus tachycardia and BP approaching 140's. Increase metoprolol to 50mg  po BID.  Continue ASA, statin. Tolerating room air with good oxygen saturation. She is doing well with mobility.  Plan to discharge to home today. Instructions given. Post discharge care discussed with her daughter over the phone.    LOS: 9 days    Antony Odea, [place] [phone] 06/23/2019

## 2019-06-23 NOTE — Progress Notes (Signed)
Pt ambulated x 470 feet around unit, pt tolerated well  °

## 2019-06-24 ENCOUNTER — Other Ambulatory Visit: Payer: Self-pay

## 2019-06-24 LAB — GLUCOSE, CAPILLARY: Glucose-Capillary: 79 mg/dL (ref 70–99)

## 2019-07-02 NOTE — Progress Notes (Signed)
Cardiology Office Note   Date:  07/08/2019   ID:  [name], [name] Jan 25, 1946, MRN [id number]  PCP:  Rory Percy, MD  Cardiologist: Dr. Irish Lack, MD   Chief Complaint  Patient presents with  . Hospitalization Follow-up    History of Present Illness: Kathleen Terrell is a 73 y.o. female who presents for post CABG follow up. She has a past medical hx of diverticulitis, hx of colonic polyps,previous tobacco abuse-quit in 1985,spondylolisthesis of the lumbar region and hx of heme + stool. She was last seen on 05/27/2019 by Dr. Irish Lack via telemedicine encounter after experiencing a syncopal episode 02/2019. Plan was to proceed with coronary CTA performed on 06/11/2019. This showed hemodynamically significant lesions in the ostial RCA, mLAD and pLCx. She then underwent a cath on 06/14/2019 that showed three vessel CAD with referral to [facility] for surgical intervention.   Pt underwent coronary artery bypass grafting x3 on 06/18/2019 with Dr. Servando Snare and Dr. Kipp Brood assisting. Overall she did very well in the pos-op setting. She did have some issues with tachycardia in which her metoprolol was increased. Lasix was added for mild fluid volume overload. She was discharged on 06/21/2019. She was enrolled in cardiac rehabilitation. Her lisinopril-HCTZ was stopped and she was started on Lasix 40mg  daily for 7 days only. She was placed on Metoprolol 50mg  BID and pravastatin 40. I do not see ASA on her list per [facility] discharge notes.   Plan is to see [facility] in follow up 07/22/2019.  Patient reports that she has been doing well after being discharged in the postoperative setting.  She denies chest pain, shortness of breath, PND, orthopnea, LE swelling, dizziness or syncope.  She has stopped her Lasix 40 mg daily and potassium supplementation as directed.  As above, it does not appear that she was discharged on ASA and therefore we will start today.  Per chart review, there does not seem to be a  definitive reason for this.    We discussed cardiac rehabilitation in which her daughter has contacted and plan is to start in early September.  She has been following sternal precautions with no driving and movements "in the tube ".  She does not have significant surgical pain and is down to 1 tablet/day.  We discussed eventually transitioning to Tylenol for pain management.  She has follow-up with Dr. Servando Snare on 07/22/2019 and follow-up with [place] closely thereafter.  We discussed low-sodium, low-fat diet and the importance of medication compliance.  She has had follow-up with her PCP since being discharged via virtual telemedicine visit.  We will obtain labs today, patient agrees.  Overall she appears to be doing well.  Past Medical History:  Diagnosis Date  . Abnormal stress test   . Acute serous otitis media    LEFT EAR  . Arthritis   . Atopic eczema    DERMATITIS  . Cataracts, bilateral   . Colon adenomas   . Diverticulitis 11/24/2011  . DM (diabetes mellitus) ([place])    patient denies, not on medications, diet controlled  . Headache   . Heme + stool 03/22/2017  . History of colonic polyps 03/22/2017  . HTN (hypertension)   . Hyperlipidemia   . Hypothyroidism   . Impacted cerumen of left ear   . Low back pain   . Melena   . PONV (postoperative nausea and vomiting)   . Spondylolisthesis of lumbar region 09/06/2016  . Syncope     Past Surgical History:  Procedure Laterality Date  . BACK SURGERY  09/05/2016   L4 and L5   . COLONOSCOPY  [facility]   >5 YEARS AGO  . COLONOSCOPY  12/12/2011   Procedure: COLONOSCOPY;  Surgeon: Dorothyann Peng, MD;  [facility];  Service: Endoscopy;  Laterality: N/A;  9:00  . COLONOSCOPY N/A 04/14/2017   Procedure: COLONOSCOPY;  Surgeon: Danie Binder, MD;  [facility];  Service: Endoscopy;  Laterality: N/A;  1030 - per office, pt can't come earlier   . CORONARY ARTERY BYPASS GRAFT N/A 06/18/2019   Procedure: CORONARY ARTERY  BYPASS GRAFTING (CABG) x 3, ON PUMP, LIMA TO LAD, SVG TO RCA, SVG TO OM 1, USING LEFT INTERNAL MAMMARY ARTERY AND RIGHT GREATER SAPHENOUS VEIN HARVESTED ENDOSCOPICALLY;  Surgeon: Grace Isaac, MD;  Location: [facility];  Service: Open Heart Surgery;  Laterality: N/A;  . cyst removed     under the left arm  . DILATION AND CURETTAGE OF UTERUS    . LEFT HEART CATH AND CORONARY ANGIOGRAPHY N/A 06/14/2019   Procedure: LEFT HEART CATH AND CORONARY ANGIOGRAPHY;  Surgeon: Jettie Booze, MD;  Location: [facility];  Service: Cardiovascular;  Laterality: N/A;  . POLYPECTOMY  04/14/2017   Procedure: POLYPECTOMY;  Surgeon: Danie Binder, MD;  [facility];  Service: Endoscopy;;  ascending, hepatic flexure, prox. transverse, sigmoid x3  . SALPINGOOPHORECTOMY     left  . TEE WITHOUT CARDIOVERSION N/A 06/18/2019   Procedure: TRANSESOPHAGEAL ECHOCARDIOGRAM (TEE);  Surgeon: Grace Isaac, MD;  Location: [facility];  Service: Open Heart Surgery;  Laterality: N/A;  . TUBAL LIGATION    . tubual pregnancy       Current Outpatient Medications  Medication Sig Dispense Refill  . Cholecalciferol (VITAMIN D3) 2000 units TABS Take 2,000 Units by mouth daily.    . hydrocortisone (PROCTOZONE-HC) 2.5 % rectal cream Place 1 application rectally 4 (four) times daily. UP TO 10 DAYS FOR RECTAL PAIN/BLEEDING 30 g 0  . levothyroxine (SYNTHROID, LEVOTHROID) 88 MCG tablet Take 88 mcg by mouth daily before breakfast.     . metoprolol tartrate (LOPRESSOR) 25 MG tablet Take 2 tablets (50 mg total) by mouth 2 (two) times daily. 60 tablet 2  . Misc Natural Products (TART CHERRY ADVANCED PO) Take 1 capsule by mouth daily.    . Multiple Vitamins-Minerals (MULTIVITAMIN PO) Take 1 tablet by mouth daily.    . pravastatin (PRAVACHOL) 40 MG tablet Take 40 mg by mouth daily.      . Probiotic Product (PROBIOTIC FORMULA PO) Take 1 capsule by mouth daily.      . TURMERIC CURCUMIN PO Take 1 capsule by mouth daily.    Marland Kitchen  aspirin EC 325 MG tablet Take 1 tablet (325 mg total) by mouth daily. 90 tablet 3  . furosemide (LASIX) 40 MG tablet Take 1 tablet (40 mg total) by mouth daily for 7 days. 7 tablet 0  . potassium chloride SA (K-DUR) 20 MEQ tablet Take 1 tablet (20 mEq total) by mouth daily for 7 days. 7 tablet 0   No current facility-administered medications for this visit.     Allergies:   Methionine, Darvocet [propoxyphene n-acetaminophen], Latex, and Ultram [tramadol hcl]    Social History:  The patient  reports that she quit smoking about 35 years ago. Her smoking use included cigarettes. She smoked 1.50 packs per day. She has never used smokeless tobacco. She reports that she does not drink alcohol or  use drugs.   Family History:  The patient's family history includes CAD in her mother; Diabetes Mellitus II in her mother; Heart attack in her brother and mother; Hypertension in her daughter and mother; Kidney disease in her brother; Stroke in her brother; Thyroid disease in her mother.    ROS:  Please see the history of present illness.  Otherwise, review of systems are positive for none.   All other systems are reviewed and negative.    PHYSICAL EXAM: VS:  BP 134/70   Pulse 71   Ht 5' 3.5" (1.613 m)   Wt 181 lb 3.2 oz (82.2 kg)   SpO2 97%   BMI 31.59 kg/m  , BMI Body mass index is 31.59 kg/m.   General: Well developed, well nourished, NAD Skin: Warm, dry, intact  Neck: Negative for carotid bruits. No JVD Lungs:Clear to ausculation bilaterally. No wheezes, rales, or rhonchi. Breathing is unlabored. Cardiovascular: RRR with S1 S2. No murmurs, rubs, gallops, or LV heave appreciated. Abdomen: Soft, non-tender, non-distended. No obvious abdominal masses. Extremities: No edema. No clubbing or cyanosis. DP/PT pulses 1+ bilaterally Neuro: Alert and oriented. No focal deficits. No facial asymmetry. MAE spontaneously. Psych: Responds to questions appropriately with normal affect.     EKG:  EKG is  not ordered today.   Recent Labs: 06/19/2019: Magnesium 2.5 06/21/2019: Hemoglobin 8.1; Platelets 140 06/23/2019: BUN 9; Creatinine, Ser 0.68; Potassium 3.8; Sodium 138   Lipid Panel No results found for: CHOL, TRIG, HDL, CHOLHDL, VLDL, LDLCALC, LDLDIRECT    Wt Readings from Last 3 Encounters:  07/08/19 181 lb 3.2 oz (82.2 kg)  06/23/19 183 lb 14.4 oz (83.4 kg)  05/27/19 181 lb (82.1 kg)     Other studies Reviewed: Additional studies/ records that were reviewed today include:   Coronary CTA 06/11/2019: 1. Left Main:  No significant stenosis. FFR = 0.97  2. LAD: Proximal FFR = 0.87, Mid FFR = 0.65, Distal FFR = < 0.50 3. LCX: Proximal FFR = 0.71, Distal FFR = <0.50 4. RCA: Proximal FFR = 0.61, Mid FFR = 0.58, Distal FFR = could not be mapped  IMPRESSION: 1. CT FFR analysis showed hemodynamically significant lesions in the ostial RCA, mid LAD, and proximal circumflex.   LHC 06/14/2019:  Ost Cx to Prox Cx lesion is 70% stenosed.  Prox Cx to Mid Cx lesion is 75% stenosed.  Ost LAD to Prox LAD lesion is 70% stenosed.  Ost RCA to Prox RCA lesion is 95% stenosed.  Mid LAD lesion is 80% stenosed.  2nd Diag lesion is 75% stenosed.  The left ventricular systolic function is normal.  LV end diastolic pressure is normal.  The left ventricular ejection fraction is 55-65% by visual estimate.  There is no aortic valve stenosis.   Severe three vessel disease.  Coronary CT showed abnormal FFR in all three distributions.   Plan for cardiac surgery consult.   Will keep as inpatient.  She is reporting frequent GERD sx, which could be cardiac.  She has left main equivalent disease with an ostial RCA lesion as well.    Echocardiogram 06/14/2019: 1. The left ventricle has normal systolic function with an ejection fraction of 60-65%. The cavity size was normal. Left ventricular diastolic Doppler parameters are indeterminate. No evidence of left ventricular regional wall motion  abnormalities.  2. The right ventricle has normal systolic function. The cavity was normal. There is no increase in right ventricular wall thickness. Right ventricular systolic pressure is normal.  3. Mild calcification  of the mitral valve leaflet. There is mild mitral annular calcification present. No evidence of mitral valve stenosis.  4. The aortic valve is tricuspid. No stenosis of the aortic valve. Moderate aortic annular calcification noted.  ASSESSMENT AND PLAN:  1.  CAD status post CABG: -Denies anginal symptoms, shortness of breath, LE swelling, PND, orthopnea, dizziness or syncope -Discussed sternal precautions with slow incorporation of physical activity.  Discussed increasing activity 5 to 10 minutes at a time.  Plan is for cardiac rehab early September per daughter's report.  Has follow-up with Dr. Servando Snare 07/22/2019.  Sternal and left thigh sites without redness or edema.  Edges approximated.  Minimal pain. -We will start ASA 325 for post CABG protocol and decrease to 81 mg in 2 to 3 months thereafter -We will check CBC today for assessment of postoperative anemia and platelet count. -Continue Lopressor 50 mg twice daily, pravastatin 40 mg  2.  Postoperative anemia: -Discharge hemoglobin, 8.1 -We will recheck CBC today -May need iron supplementation based on more recent CBC -Monitor closely after start of ASA 325 mg daily -Has follow-up with Dr. Servando Snare 07/22/2019   Current medicines are reviewed at length with the patient today.  The patient does not have concerns regarding medicines.  The following changes have been made: Add ASA 325 mg daily  Labs/ tests ordered today include: CBC, BMET  Orders Placed This Encounter  Procedures  . CBC  . Basic metabolic panel    Disposition:   FU with [facility] or Dr. Irish Lack  in 1 month  Signed, Kathyrn Drown, NP  07/08/2019 3:37 PM    [facility] [place], [place], [place]  [address] Phone: [phone]; Fax: [phone]

## 2019-07-05 ENCOUNTER — Telehealth: Payer: Self-pay | Admitting: Cardiology

## 2019-07-05 NOTE — Telephone Encounter (Signed)
New Message        COVID-19 Pre-Screening Questions:   In the past 7 to 10 days have you had a cough,  shortness of breath, headache, congestion, fever (100 or greater) body aches, chills, sore throat, or sudden loss of taste or sense of smell?  NO  Have you been around anyone with known Covid 19. NO  Have you been around anyone who is awaiting Covid 19 test results in the past 7 to 10 days? NO  Have you been around anyone who has been exposed to Covid 19, or has mentioned symptoms of Covid 19 within the past 7 to 10 days? NO [name] is the pts daughter and she will be assiting the pt for her appt she answered NO to all screening questions   If you have any concerns/questions about symptoms patients report during screening (either on the phone or at threshold). Contact the provider seeing the patient or DOD for further guidance.  If neither are available contact a member of the leadership team.

## 2019-07-08 ENCOUNTER — Other Ambulatory Visit: Payer: Self-pay

## 2019-07-08 ENCOUNTER — Ambulatory Visit (INDEPENDENT_AMBULATORY_CARE_PROVIDER_SITE_OTHER): Payer: Medicare Other | Admitting: Cardiology

## 2019-07-08 ENCOUNTER — Encounter: Payer: Self-pay | Admitting: Cardiology

## 2019-07-08 VITALS — BP 134/70 | HR 71 | Ht 63.5 in | Wt 181.2 lb

## 2019-07-08 DIAGNOSIS — Z79899 Other long term (current) drug therapy: Secondary | ICD-10-CM

## 2019-07-08 DIAGNOSIS — I25118 Atherosclerotic heart disease of native coronary artery with other forms of angina pectoris: Secondary | ICD-10-CM

## 2019-07-08 DIAGNOSIS — D6489 Other specified anemias: Secondary | ICD-10-CM

## 2019-07-08 DIAGNOSIS — Z951 Presence of aortocoronary bypass graft: Secondary | ICD-10-CM | POA: Diagnosis not present

## 2019-07-08 MED ORDER — ASPIRIN EC 325 MG PO TBEC: 325.0000 mg | DELAYED_RELEASE_TABLET | Freq: Every day | ORAL | 3 refills | Status: DC

## 2019-07-08 NOTE — Patient Instructions (Addendum)
Medication Instructions:  START: Aspirin 325 mg once a day   If you need a refill on your cardiac medications before your next appointment, please call your pharmacy.   Lab work: TODAY: CBC & BMET  If you have labs (blood work) drawn today and your tests are completely normal, you will receive your results only by: Marland Kitchen MyChart Message (if you have MyChart) OR . A paper copy in the mail If you have any lab test that is abnormal or we need to change your treatment, we will call you to review the results.  Testing/Procedures: None   Follow-Up: At [facility], you and your health needs are our priority.  As part of our continuing mission to provide you with exceptional heart care, we have created designated Provider Care Teams.  These Care Teams include your primary Cardiologist (physician) and Advanced Practice Providers (APPs -  Physician Assistants and Nurse Practitioners) who all work together to provide you with the care you need, when you need it. You will need a follow up appointment in 4-6 weeks.  Please call our office 2 months in advance to schedule this appointment.  Dr. Irish Lack or one of the following Advanced Practice Providers on your designated Care Team:   [place], PA-C Melina Copa, PA-C . Ermalinda Barrios, PA-C  Any Other Special Instructions Will Be Listed Below (If Applicable).

## 2019-07-09 LAB — CBC
Hematocrit: 32.4 % — ABNORMAL LOW (ref 34.0–46.6)
Hemoglobin: 9.6 g/dL — ABNORMAL LOW (ref 11.1–15.9)
MCH: 26.2 pg — ABNORMAL LOW (ref 26.6–33.0)
MCHC: 29.6 g/dL — ABNORMAL LOW (ref 31.5–35.7)
MCV: 89 fL (ref 79–97)
Platelets: 315 10*3/uL (ref 150–450)
RBC: 3.66 x10E6/uL — ABNORMAL LOW (ref 3.77–5.28)
RDW: 14.5 % (ref 11.7–15.4)
WBC: 6.3 10*3/uL (ref 3.4–10.8)

## 2019-07-09 LAB — BASIC METABOLIC PANEL
BUN/Creatinine Ratio: 10 — ABNORMAL LOW (ref 12–28)
BUN: 8 mg/dL (ref 8–27)
CO2: 23 mmol/L (ref 20–29)
Calcium: 9.2 mg/dL (ref 8.7–10.3)
Chloride: 103 mmol/L (ref 96–106)
Creatinine, Ser: 0.77 mg/dL (ref 0.57–1.00)
GFR calc Af Amer: 89 mL/min/{1.73_m2} (ref >59)
GFR calc non Af Amer: 77 mL/min/{1.73_m2} (ref >59)
Glucose: 85 mg/dL (ref 65–99)
Potassium: 4.8 mmol/L (ref 3.5–5.2)
Sodium: 140 mmol/L (ref 134–144)

## 2019-07-15 ENCOUNTER — Encounter: Payer: Self-pay | Admitting: *Deleted

## 2019-07-19 ENCOUNTER — Other Ambulatory Visit: Payer: Self-pay | Admitting: Cardiothoracic Surgery

## 2019-07-19 DIAGNOSIS — Z951 Presence of aortocoronary bypass graft: Secondary | ICD-10-CM

## 2019-07-22 ENCOUNTER — Ambulatory Visit
Admission: RE | Admit: 2019-07-22 | Discharge: 2019-07-22 | Disposition: A | Discharge status: Home / Self Care | Payer: Medicare Other | Source: Ambulatory Visit | Attending: Cardiothoracic Surgery | Admitting: Cardiothoracic Surgery

## 2019-07-22 ENCOUNTER — Telehealth: Payer: Self-pay

## 2019-07-22 ENCOUNTER — Other Ambulatory Visit: Payer: Self-pay

## 2019-07-22 ENCOUNTER — Telehealth: Payer: Self-pay | Admitting: Interventional Cardiology

## 2019-07-22 ENCOUNTER — Ambulatory Visit (INDEPENDENT_AMBULATORY_CARE_PROVIDER_SITE_OTHER): Payer: Self-pay | Admitting: Physician Assistant

## 2019-07-22 VITALS — BP 170/92 | HR 67 | Temp 97.7°F | Resp 16 | Ht 63.5 in | Wt 178.0 lb

## 2019-07-22 DIAGNOSIS — J90 Pleural effusion, not elsewhere classified: Secondary | ICD-10-CM | POA: Diagnosis not present

## 2019-07-22 DIAGNOSIS — I2510 Atherosclerotic heart disease of native coronary artery without angina pectoris: Secondary | ICD-10-CM

## 2019-07-22 DIAGNOSIS — Z951 Presence of aortocoronary bypass graft: Secondary | ICD-10-CM

## 2019-07-22 DIAGNOSIS — J9811 Atelectasis: Secondary | ICD-10-CM | POA: Diagnosis not present

## 2019-07-22 MED ORDER — LISINOPRIL 2.5 MG PO TABS: 2.5000 mg | ORAL_TABLET | Freq: Every day | ORAL | 1 refills | Status: DC

## 2019-07-22 MED ORDER — HYDROCODONE-ACETAMINOPHEN 5-325 MG PO TABS: 1.0000 | ORAL_TABLET | Freq: Two times a day (BID) | ORAL | 0 refills | Status: DC | PRN

## 2019-07-22 NOTE — Telephone Encounter (Signed)
Patient's family contacted the office stating patient tried to fill hard script for Norco and Lisinopril.  [place] stated patient needed narcotic sent it electronically if needing a refill.  Advised family I would let [place], PA know.  Patient contact and made aware prescription would be sent in.

## 2019-07-22 NOTE — Telephone Encounter (Signed)
Pt called back after receiving letter tcb for her lab results. Pt has given me permission ok to s/w her daughter [name], both the pt and her daughter were on the phone. I went over lab results with [name] by phone with verbal understanding. I states her Hgb is improved since d/c though still anemic and we will keep a close eye on her Hgb. Pt has appt 8/18 with Ellen Henri, [place]. Both the pt and her daughter thanked me for the call.

## 2019-07-22 NOTE — Addendum Note (Signed)
Addended by: [name] Giovanni on: 07/22/2019 04:15 PM   Modules accepted: Orders

## 2019-07-22 NOTE — Progress Notes (Signed)
[address]       [place] [address]             [phone]      Kathleen Terrell is a 73 y.o. female patient s/p CABG x 4 on 06/18/2019 with Dr. Servando Snare. Overall, the patient is doing well today.   1. Coronary artery disease involving native heart without angina pectoris, unspecified vessel or lesion type   2. S/P CABG (coronary artery bypass graft)    Past Medical History:  Diagnosis Date  . Abnormal stress test   . Acute serous otitis media    LEFT EAR  . Arthritis   . Atopic eczema    DERMATITIS  . Cataracts, bilateral   . Colon adenomas   . Diverticulitis 11/24/2011  . DM (diabetes mellitus) ([place])    patient denies, not on medications, diet controlled  . Headache   . Heme + stool 03/22/2017  . History of colonic polyps 03/22/2017  . HTN (hypertension)   . Hyperlipidemia   . Hypothyroidism   . Impacted cerumen of left ear   . Low back pain   . Melena   . PONV (postoperative nausea and vomiting)   . Spondylolisthesis of lumbar region 09/06/2016  . Syncope    No past surgical history pertinent negatives on file.   Scheduled Meds: Current Outpatient Medications on File Prior to Visit  Medication Sig Dispense Refill  . aspirin EC 325 MG tablet Take 1 tablet (325 mg total) by mouth daily. 90 tablet 3  . Cholecalciferol (VITAMIN D3) 2000 units TABS Take 2,000 Units by mouth daily.    . hydrocortisone (PROCTOZONE-HC) 2.5 % rectal cream Place 1 application rectally 4 (four) times daily. UP TO 10 DAYS FOR RECTAL PAIN/BLEEDING 30 g 0  . levothyroxine (SYNTHROID, LEVOTHROID) 88 MCG tablet Take 88 mcg by mouth daily before breakfast.     . metoprolol tartrate (LOPRESSOR) 25 MG tablet Take 2 tablets (50 mg total) by mouth 2 (two) times daily. 60 tablet 2  . Misc Natural Products (TART CHERRY ADVANCED PO) Take 1 capsule by mouth daily.    . Multiple Vitamins-Minerals (MULTIVITAMIN PO) Take 1 tablet by mouth daily.    . pravastatin (PRAVACHOL) 40 MG tablet  Take 40 mg by mouth daily.      . Probiotic Product (PROBIOTIC FORMULA PO) Take 1 capsule by mouth daily.      . TURMERIC CURCUMIN PO Take 1 capsule by mouth daily.     No current facility-administered medications on file prior to visit.     Allergies  Allergen Reactions  . Methionine Other (See Comments)    UNSPECIFIED REACTION  Pt not sure  . Darvocet [Propoxyphene N-Acetaminophen] Nausea Only and Other (See Comments)  . Latex Rash  . Ultram [Tramadol Hcl] Nausea And Vomiting    Blood pressure (!) 170/92, pulse 67, temperature 97.7 F (36.5 C), resp. rate 16, height 5' 3.5" (1.613 m), weight 178 lb (80.7 kg), SpO2 95 %.  Subjective   She is s/p CABG x 3 with Dr. Servando Snare. She is overall doing very well.  Her blood pressure was elevated today at 170/92.  When questioning the daughter she has been around 277A systolic at home.  I decided to add lisinopril to her medication regimen and a prescription was given to the patient today.   Objective   Cor: RRR, no murmur Pulm: CTA bilaterally and in all fields Abd: no tenderness Wound: Sternal wound was  c/d/i. EVH site on the right was c/d/i Ext: no edema   CLINICAL DATA:  Prior CABG.  EXAM: CHEST - 2 VIEW  COMPARISON:  06/21/2019.  FINDINGS: Prior CABG. Heart size normal. Mild left base subsegmental atelectasis and tiny left pleural effusion again noted. No interim change. No pneumothorax. Prior lumbar spine fusion.  IMPRESSION: 1.  Prior CABG.  Heart size stable.  No pulmonary venous congestion.  2. Persistent mild left base subsegmental atelectasis and small left pleural effusion. Chest is stable from prior exam.   Electronically Signed   By: Marcello Moores  Register   On: 07/22/2019 12:38  Assessment & Plan   She is currently still staying with her daughter since coming home from the hospital.  She has been walking several times a day without much shortness of breath.  She does occasionally still have some  sternal pain and is still taking Norco about 1-2 times a day.  When she takes her pain medication it is in the evening and nighttime.  I did clear her for driving during the day as long as she feels up to it.  She did have a previous cardiac rehab referral but has not had a chance to call back.  If she needs an additional referral she is to call our office.  However, I do think that she plans to attend cardiac rehab at their earliest availability.  We discussed increasing her weight limitation about 2 pounds a week but she still be cautious about some pushing and pulling movements.  She mentioned she pulled on her refrigerator fairly hard the other day and had some chest soreness afterwards.  I reviewed her chest x-ray with both the patient and the daughter at the bedside and answered all questions.  Do not have any concerns about her chest x-ray.  She overall feels well today and I do not think that she needs any routine follow-up from our office.  She knows that she can call us at anytime with any new problems arise and we can schedule an appointment for her.  She does have a follow-up with cardiology on 08/13/2019 where they are going to check some labs.  They can also titrate her blood pressure medicine if indicated at this time.  New medications: Lisinopril 2.5mg  daily, Norco 5-325mg  BID PRN  Disp #20.   Elgie Collard 07/22/2019

## 2019-07-22 NOTE — Telephone Encounter (Signed)
New Message  Patient is calling in returning calls about her labs. She is staying with her daughter, so she has not been home to answer her home phone. Please give patient a call back on her mobile number.

## 2019-07-23 DIAGNOSIS — I2510 Atherosclerotic heart disease of native coronary artery without angina pectoris: Secondary | ICD-10-CM | POA: Diagnosis not present

## 2019-07-23 DIAGNOSIS — E785 Hyperlipidemia, unspecified: Secondary | ICD-10-CM | POA: Diagnosis not present

## 2019-07-23 DIAGNOSIS — E039 Hypothyroidism, unspecified: Secondary | ICD-10-CM | POA: Diagnosis not present

## 2019-07-23 DIAGNOSIS — I10 Essential (primary) hypertension: Secondary | ICD-10-CM | POA: Diagnosis not present

## 2019-08-06 ENCOUNTER — Other Ambulatory Visit: Payer: Self-pay | Admitting: Cardiology

## 2019-08-06 MED ORDER — METOPROLOL TARTRATE 25 MG PO TABS: 50.0000 mg | ORAL_TABLET | Freq: Two times a day (BID) | ORAL | 3 refills | Status: DC

## 2019-08-06 NOTE — Telephone Encounter (Signed)
Pt's medication was sent to pt's pharmacy as requested. Confirmation received.  °

## 2019-08-13 ENCOUNTER — Encounter: Payer: Self-pay | Admitting: Cardiology

## 2019-08-13 ENCOUNTER — Other Ambulatory Visit: Payer: Self-pay

## 2019-08-13 ENCOUNTER — Ambulatory Visit (INDEPENDENT_AMBULATORY_CARE_PROVIDER_SITE_OTHER): Payer: Medicare Other | Admitting: Cardiology

## 2019-08-13 VITALS — BP 152/72 | HR 76 | Ht 63.5 in | Wt 172.8 lb

## 2019-08-13 DIAGNOSIS — Z951 Presence of aortocoronary bypass graft: Secondary | ICD-10-CM | POA: Diagnosis not present

## 2019-08-13 DIAGNOSIS — I10 Essential (primary) hypertension: Secondary | ICD-10-CM

## 2019-08-13 DIAGNOSIS — Z79899 Other long term (current) drug therapy: Secondary | ICD-10-CM | POA: Diagnosis not present

## 2019-08-13 DIAGNOSIS — I25118 Atherosclerotic heart disease of native coronary artery with other forms of angina pectoris: Secondary | ICD-10-CM | POA: Diagnosis not present

## 2019-08-13 MED ORDER — ATORVASTATIN CALCIUM 40 MG PO TABS: 40.0000 mg | ORAL_TABLET | Freq: Every day | ORAL | 3 refills | Status: DC

## 2019-08-13 MED ORDER — LISINOPRIL 5 MG PO TABS: 5.0000 mg | ORAL_TABLET | Freq: Every day | ORAL | 3 refills | Status: DC

## 2019-08-13 NOTE — Patient Instructions (Signed)
Medication Instructions:  STOP PRAVASTATIN  START ATORVASTATIN (LIPITOR)  INCREASE LISINOPRIL TO 5 mg DAILY If you need a refill on your cardiac medications before your next appointment, please call your pharmacy.   Lab work:PLEASE SCHEDULE 8 WEEKS FLP HFT If you have labs (blood work) drawn today and your tests are completely normal, you will receive your results only by: Marland Kitchen MyChart Message (if you have MyChart) OR . A paper copy in the mail If you have any lab test that is abnormal or we need to change your treatment, we will call you to review the results.  Testing/Procedures: NONE  Follow-Up: 4 MONTHS with Dr Irish Lack At [facility], you and your health needs are our priority.  As part of our continuing mission to provide you with exceptional heart care, we have created designated Provider Care Teams.  These Care Teams include your primary Cardiologist (physician) and Advanced Practice Providers (APPs -  Physician Assistants and Nurse Practitioners) who all work together to provide you with the care you need, when you need it. .   Any Other Special Instructions Will Be Listed Below (If Applicable).  Monitor BP/HR for 1 week.  I will call you.

## 2019-08-13 NOTE — Progress Notes (Signed)
08/13/2019 Kathleen Terrell   1946-01-14  [id number]  Primary Physician Rory Percy, MD Primary Cardiologist: Dr. Irish Lack  Electrophysiologist: None   Reason for Visit/CC: f/u for CAD s/p CABG   HPI:  Kathleen Terrell is a 73 y.o. female who is being seen today for follow-up for CAD.  She was recently evaluated for syncope and was set up for coronary CTA which showed significant CAD.  Echo with normal LVEF, no significant valvular abnormalities. She was then referred for cardiac catheterization which revealed severe three-vessel disease and she ultimately underwent CABG surgery by Dr. Servando Snare and Dr. Kipp Brood on June 18, 2019.  She did fairly well postoperatively.  No postoperative atrial fibrillation.  She was seen for post hospital follow-up on July 08, 2019 and was doing well.  She was instructed to follow-up again in 4 to 6 weeks.  She is back today for follow-up.  She is here with her daughter.  She reports that she has done well since her last office visit.  Stable from a symptom standpoint. No angina. Denies CP and dyspnea. Compliant w/ meds but BP has been high. 152/72 today. Has been in the 458K-998P systolic at home. HR 76 in clinic today. On metoprolol and low dose lisinopril. She reports full compliance w/ ASA and statin. She is on Pravachol. Last lipid panel in March showed LDL at 96 mg/dL. She has not tried any other statins. No side effects on current regimen. She is a former smoker. Quit 35 years ago. She plans to start cardiac rehab in September.   Current Meds  Medication Sig  . aspirin EC 325 MG tablet Take 1 tablet (325 mg total) by mouth daily.  . Cholecalciferol (VITAMIN D3) 2000 units TABS Take 2,000 Units by mouth daily.  Marland Kitchen HYDROcodone-acetaminophen (NORCO) 5-325 MG tablet Take 1 tablet by mouth 2 (two) times daily as needed for up to 20 doses for moderate pain.  . hydrocortisone (PROCTOZONE-HC) 2.5 % rectal cream Place 1 application rectally 4 (four) times daily. UP TO  10 DAYS FOR RECTAL PAIN/BLEEDING  . levothyroxine (SYNTHROID, LEVOTHROID) 88 MCG tablet Take 88 mcg by mouth daily before breakfast.   . lisinopril (ZESTRIL) 2.5 MG tablet Take 1 tablet (2.5 mg total) by mouth daily.  . metoprolol tartrate (LOPRESSOR) 25 MG tablet Take 2 tablets (50 mg total) by mouth 2 (two) times daily.  . Misc Natural Products (TART CHERRY ADVANCED PO) Take 1 capsule by mouth daily.  . Multiple Vitamins-Minerals (MULTIVITAMIN PO) Take 1 tablet by mouth daily.  . pravastatin (PRAVACHOL) 40 MG tablet Take 40 mg by mouth daily.    . Probiotic Product (PROBIOTIC FORMULA PO) Take 1 capsule by mouth daily.    . TURMERIC CURCUMIN PO Take 1 capsule by mouth daily.   Allergies  Allergen Reactions  . Methionine Other (See Comments)    UNSPECIFIED REACTION  Pt not sure  . Darvocet [Propoxyphene N-Acetaminophen] Nausea Only and Other (See Comments)  . Latex Rash  . Ultram [Tramadol Hcl] Nausea And Vomiting   Past Medical History:  Diagnosis Date  . Abnormal stress test   . Acute serous otitis media    LEFT EAR  . Arthritis   . Atopic eczema    DERMATITIS  . Cataracts, bilateral   . Colon adenomas   . Diverticulitis 11/24/2011  . DM (diabetes mellitus) ([place])    patient denies, not on medications, diet controlled  . Headache   . Heme + stool 03/22/2017  . History  of colonic polyps 03/22/2017  . HTN (hypertension)   . Hyperlipidemia   . Hypothyroidism   . Impacted cerumen of left ear   . Low back pain   . Melena   . PONV (postoperative nausea and vomiting)   . Spondylolisthesis of lumbar region 09/06/2016  . Syncope    Family History  Problem Relation Age of Onset  . Heart attack Mother   . Hypertension Mother   . Thyroid disease Mother   . CAD Mother   . Diabetes Mellitus II Mother   . Kidney disease Brother   . Stroke Brother   . Heart attack Brother   . Hypertension Daughter   . Colon cancer Neg Hx   . Colon polyps Neg Hx    Past Surgical History:   Procedure Laterality Date  . BACK SURGERY  09/05/2016   L4 and L5   . COLONOSCOPY  [facility]   >5 YEARS AGO  . COLONOSCOPY  12/12/2011   Procedure: COLONOSCOPY;  Surgeon: Dorothyann Peng, MD;  [facility];  Service: Endoscopy;  Laterality: N/A;  9:00  . COLONOSCOPY N/A 04/14/2017   Procedure: COLONOSCOPY;  Surgeon: Danie Binder, MD;  [facility];  Service: Endoscopy;  Laterality: N/A;  1030 - per office, pt can't come earlier   . CORONARY ARTERY BYPASS GRAFT N/A 06/18/2019   Procedure: CORONARY ARTERY BYPASS GRAFTING (CABG) x 3, ON PUMP, LIMA TO LAD, SVG TO RCA, SVG TO OM 1, USING LEFT INTERNAL MAMMARY ARTERY AND RIGHT GREATER SAPHENOUS VEIN HARVESTED ENDOSCOPICALLY;  Surgeon: Grace Isaac, MD;  Location: [place];  Service: Open Heart Surgery;  Laterality: N/A;  . cyst removed     under the left arm  . DILATION AND CURETTAGE OF UTERUS    . LEFT HEART CATH AND CORONARY ANGIOGRAPHY N/A 06/14/2019   Procedure: LEFT HEART CATH AND CORONARY ANGIOGRAPHY;  Surgeon: Jettie Booze, MD;  Location: [facility];  Service: Cardiovascular;  Laterality: N/A;  . POLYPECTOMY  04/14/2017   Procedure: POLYPECTOMY;  Surgeon: Danie Binder, MD;  [facility];  Service: Endoscopy;;  ascending, hepatic flexure, prox. transverse, sigmoid x3  . SALPINGOOPHORECTOMY     left  . TEE WITHOUT CARDIOVERSION N/A 06/18/2019   Procedure: TRANSESOPHAGEAL ECHOCARDIOGRAM (TEE);  Surgeon: Grace Isaac, MD;  Location: [facility];  Service: Open Heart Surgery;  Laterality: N/A;  . TUBAL LIGATION    . tubual pregnancy     Social History   Socioeconomic History  . Marital status: Widowed    Spouse name: Not on file  . Number of children: Not on file  . Years of education: Not on file  . Highest education level: Not on file  Occupational History  . Occupation: HAIR DRESSER- [employer]  Social Needs  . Financial resource strain: Not on file  . Food insecurity     Worry: Not on file    Inability: Not on file  . Transportation needs    Medical: Not on file    Non-medical: Not on file  Tobacco Use  . Smoking status: Former Smoker    Packs/day: 1.50    Types: Cigarettes    Quit date: 03/09/1984    Years since quitting: 35.4  . Smokeless tobacco: Never Used  Substance and Sexual Activity  . Alcohol use: No  . Drug use: No  . Sexual activity: Not on file  Lifestyle  . Physical activity    Days per  week: Not on file    Minutes per session: Not on file  . Stress: Not on file  Relationships  . Social Herbalist on phone: Not on file    Gets together: Not on file    Attends religious service: Not on file    Active member of club or organization: Not on file    Attends meetings of clubs or organizations: Not on file    Relationship status: Not on file  . Intimate partner violence    Fear of current or ex partner: Not on file    Emotionally abused: Not on file    Physically abused: Not on file    Forced sexual activity: Not on file  Other Topics Concern  . Not on file  Social History Narrative   Used to work for [employer] 73 yo     Lipid Panel  No results found for: CHOL, TRIG, HDL, CHOLHDL, VLDL, LDLCALC, LDLDIRECT  Review of Systems: General: negative for chills, fever, night sweats or weight changes.  Cardiovascular: negative for chest pain, dyspnea on exertion, edema, orthopnea, palpitations, paroxysmal nocturnal dyspnea or shortness of breath Dermatological: negative for rash Respiratory: negative for cough or wheezing Urologic: negative for hematuria Abdominal: negative for nausea, vomiting, diarrhea, bright red blood per rectum, melena, or hematemesis Neurologic: negative for visual changes, syncope, or dizziness All other systems reviewed and are otherwise negative except as noted above.   Physical Exam:  Blood pressure (!) 152/72, pulse 76, height 5' 3.5" (1.613 m), weight 172 lb 12.8 oz  (78.4 kg), SpO2 96 %.  General appearance: alert, cooperative and no distress Neck: no carotid bruit and no JVD Lungs: clear to auscultation bilaterally Heart: regular rate and rhythm, S1, S2 normal, no murmur, click, rub or gallop Extremities: extremities normal, atraumatic, no cyanosis or edema Pulses: 2+ and symmetric Skin: Skin color, texture, turgor normal. No rashes or lesions Neurologic: Grossly normal  EKG not performed -- personally reviewed   ASSESSMENT AND PLAN:   1. CAD: s/p recent CABG. Stable w/o angina. Progressing well from a post surgical standpoint. Continue medical therapy and aggressive risk factor modification for secondary prevention of CAD. Continue ASA, statin,  blocker and ACEi. Plan to enroll in cardiac rehab.   2. HLD: recent lipid panel showed elevated LDL at 96 mg/dL. Goal given CAD and recent CABG is < 70 mg/dL. We will stop Pravastatin. Add Lipitor 40 mg nightly. F/u FLP and HFTs in 8 weeks.   3. HTN: elevated in clinic and has been elevated at home in the 034V systolic. Will further increase lisinopril to 5 mg daily. Will have RN f/u with phone call in 1 week. If BP still >140/90, will advise further increase in lisinopril dose to 10 mg and f/u in HTN clinic 2 weeks thereafter.    Follow-Up w/ Dr. Irish Lack in 4 months.    Ladoris Gene, MHS [facility] 08/13/2019 2:48 PM

## 2019-08-20 ENCOUNTER — Telehealth: Payer: Self-pay

## 2019-08-20 DIAGNOSIS — Z79899 Other long term (current) drug therapy: Secondary | ICD-10-CM

## 2019-08-20 DIAGNOSIS — I10 Essential (primary) hypertension: Secondary | ICD-10-CM

## 2019-08-20 NOTE — Telephone Encounter (Signed)
Spoke to patient

## 2019-08-20 NOTE — Telephone Encounter (Signed)
I spoke to the patient who increased her Lisinopril to 5 mg daily 1 week ago.  She was to monitor BP/HR and I followed up today. Last Wednesday 148/84 hr 77; R- 158/89  hr 70; F 156/84 hr 62; SAT 184/94 hr 72; SUN 140/68 hr 74; M 136/72 hr 64..  In your note it says increase to 10 mg and f/u with HTN clinic 2 weeks.  Would you recommend that?  Thank you

## 2019-08-22 MED ORDER — LISINOPRIL 5 MG PO TABS: 10.0000 mg | ORAL_TABLET | Freq: Every day | ORAL | 3 refills | Status: DC

## 2019-08-22 NOTE — Telephone Encounter (Signed)
I spoke to the patient with Brittainy's recommendation. 

## 2019-08-27 ENCOUNTER — Encounter (HOSPITAL_COMMUNITY)
Admission: RE | Admit: 2019-08-27 | Discharge: 2019-08-27 | Disposition: A | Discharge status: Home / Self Care | Payer: Medicare Other | Source: Ambulatory Visit | Attending: Cardiothoracic Surgery | Admitting: Cardiothoracic Surgery

## 2019-08-27 ENCOUNTER — Other Ambulatory Visit: Payer: Self-pay

## 2019-08-27 ENCOUNTER — Encounter (HOSPITAL_COMMUNITY): Payer: Self-pay

## 2019-08-27 VITALS — BP 128/62 | HR 82 | Temp 98.8°F | Ht 63.0 in | Wt 175.7 lb

## 2019-08-27 DIAGNOSIS — Z951 Presence of aortocoronary bypass graft: Secondary | ICD-10-CM | POA: Diagnosis not present

## 2019-08-27 NOTE — Progress Notes (Signed)
Cardiac/Pulmonary Rehab Medication Review by a Pharmacist  Does the patient  feel that his/her medications are working for him/her?  yes  Has the patient been experiencing any side effects to the medications prescribed?  no  Does the patient measure his/her own blood pressure or blood glucose at home?  yes   Does the patient have any problems obtaining medications due to transportation or finances?   no  Understanding of regimen: good Understanding of indications: good Potential of compliance: excellent  Questions asked to Determine Patient Understanding of Medication Regimen:  1. What is the name of the medication?  2. What is the medication used for?  3. When should it be taken?  4. How much should be taken?  5. How will you take it?  6. What side effects should you report?  Understanding Defined as: Excellent: All questions above are correct Good: Questions 1-4 are correct Fair: Questions 1-2 are correct  Poor: 1 or none of the above questions are correct   Pharmacist comments: [name] presents to cardiac rehab today and we reviewed her medications. She is compliant with her regimen and tolerating. She is still titrating her BP medications for BP Goal SBP < 140. Her BP today is 128/62. She does monitor her BP and excited she is within goal. Continue with current regimen.  Thanks for the opportunity to participate in the care of this patient, Kathleen Terrell, BS Vena [place], [place] Pharmacist Pager [phone] 08/27/2019 1:08 PM

## 2019-08-27 NOTE — Progress Notes (Signed)
Daily Session Note  Patient Details  Name: Kathleen Terrell MRN: [id number] Date of Birth: [birth year] Referring Provider:     CARDIAC REHAB PHASE II ORIENTATION from 08/27/2019 in [place]  Referring Provider  [place]      Encounter Date: 08/27/2019  Check In: Session Check In - 08/27/19 1344      Check-In   Supervising physician immediately available to respond to emergencies  See telemetry face sheet for immediately available MD    Location  [facility]    Staff Present  Russella Dar, MS, EP, [facility], Exercise Physiologist;Debra Wynetta Emery, RN, Cory Munch, Exercise Physiologist    Virtual Visit  No    Medication changes reported      No    Fall or balance concerns reported     No    Tobacco Cessation  No Change   Quit 1985   Warm-up and Cool-down  Performed as group-led instruction    Resistance Training Performed  Yes    VAD Patient?  No    PAD/SET Patient?  No      Pain Assessment   Currently in Pain?  No/denies    Pain Score  0-No pain    Multiple Pain Sites  No       Capillary Blood Glucose: No results found for this or any previous visit (from the past 24 hour(s)).  Exercise Prescription Changes - 08/27/19 1400      Response to Exercise   Blood Pressure (Admit)  128/62    Blood Pressure (Exercise)  142/68    Blood Pressure (Exit)  132/70    Heart Rate (Admit)  72 bpm    Heart Rate (Exercise)  118 bpm    Heart Rate (Exit)  65 bpm    Oxygen Saturation (Admit)  99 %    Oxygen Saturation (Exercise)  91 %    Oxygen Saturation (Exit)  96 %    Rating of Perceived Exertion (Exercise)  11    Perceived Dyspnea (Exercise)  13    Symptoms  none    Comments  6MWT    Duration  Progress to 30 minutes of  aerobic without signs/symptoms of physical distress    Intensity  THRR New   [phone]     Home Exercise Plan   Plans to continue exercise at  Home (comment)   walking   Frequency  Add 2 additional days to program exercise  sessions.    Initial Home Exercises Provided  08/27/19       Social History   Tobacco Use  Smoking Status Former Smoker  . Packs/day: 1.00  . Years: 19.00  . Pack years: 19.00  . Types: Cigarettes  . Quit date: 03/09/1984  . Years since quitting: 35.4  Smokeless Tobacco Never Used    Goals Met:  Independence with exercise equipment Exercise tolerated well Personal goals reviewed No report of cardiac concerns or symptoms Strength training completed today  Goals Unmet:  Not Applicable  Comments: Check out: 1415   Dr. Kate Sable is Medical Director for [place] and Pulmonary Rehab.

## 2019-08-27 NOTE — Progress Notes (Signed)
Cardiac Individual Treatment Plan  Patient Details  Name: Kathleen Terrell MRN: [id number] Date of Birth: November 27, 1946 Referring Provider:     CARDIAC REHAB PHASE II ORIENTATION from 08/27/2019 in [facility]  Referring Provider  Gerhardt      Initial Encounter Date:    CARDIAC REHAB PHASE II ORIENTATION from 08/27/2019 in [facility]  Date  08/27/19      Visit Diagnosis: S/P CABG x 3  Patient's Home Medications on Admission:  Current Outpatient Medications:  .  aspirin EC 325 MG tablet, Take 1 tablet (325 mg total) by mouth daily., Disp: 90 tablet, Rfl: 3 .  atorvastatin (LIPITOR) 40 MG tablet, Take 1 tablet (40 mg total) by mouth daily., Disp: 90 tablet, Rfl: 3 .  Cholecalciferol (VITAMIN D3) 2000 units TABS, Take 2,000 Units by mouth daily., Disp: , Rfl:  .  HYDROcodone-acetaminophen (NORCO) 5-325 MG tablet, Take 1 tablet by mouth 2 (two) times daily as needed for up to 20 doses for moderate pain., Disp: 20 tablet, Rfl: 0 .  hydrocortisone (PROCTOZONE-HC) 2.5 % rectal cream, Place 1 application rectally 4 (four) times daily. UP TO 10 DAYS FOR RECTAL PAIN/BLEEDING (Patient not taking: Reported on 08/27/2019), Disp: 30 g, Rfl: 0 .  levothyroxine (SYNTHROID, LEVOTHROID) 88 MCG tablet, Take 88 mcg by mouth daily before breakfast. , Disp: , Rfl:  .  lisinopril (ZESTRIL) 5 MG tablet, Take 2 tablets (10 mg total) by mouth daily., Disp: 90 tablet, Rfl: 3 .  metoprolol tartrate (LOPRESSOR) 25 MG tablet, Take 2 tablets (50 mg total) by mouth 2 (two) times daily., Disp: 180 tablet, Rfl: 3 .  Misc Natural Products (TART CHERRY ADVANCED PO), Take 1 capsule by mouth daily., Disp: , Rfl:  .  Multiple Vitamins-Minerals (MULTIVITAMIN PO), Take 1 tablet by mouth daily., Disp: , Rfl:  .  Probiotic Product (PROBIOTIC FORMULA PO), Take 1 capsule by mouth daily.  , Disp: , Rfl:  .  TURMERIC CURCUMIN PO, Take 1 capsule by mouth daily., Disp: , Rfl:   Past Medical  History: Past Medical History:  Diagnosis Date  . Abnormal stress test   . Acute serous otitis media    LEFT EAR  . Arthritis   . Atopic eczema    DERMATITIS  . Cataracts, bilateral   . Colon adenomas   . Diverticulitis 11/24/2011  . DM (diabetes mellitus) ([place])    patient denies, not on medications, diet controlled  . Headache   . Heme + stool 03/22/2017  . History of colonic polyps 03/22/2017  . HTN (hypertension)   . Hyperlipidemia   . Hypothyroidism   . Impacted cerumen of left ear   . Low back pain   . Melena   . PONV (postoperative nausea and vomiting)   . Spondylolisthesis of lumbar region 09/06/2016  . Syncope     Tobacco Use: Social History   Tobacco Use  Smoking Status Former Smoker  . Packs/day: 1.00  . Years: 19.00  . Pack years: 19.00  . Types: Cigarettes  . Quit date: 03/09/1984  . Years since quitting: 35.4  Smokeless Tobacco Never Used    Labs: Recent Chemical engineer    Labs for ITP Cardiac and Pulmonary Rehab Latest Ref Rng & Units 06/18/2019 06/18/2019 06/18/2019 06/18/2019 06/18/2019   Hemoglobin A1c 4.8 - 5.6 % - - - - -   PHART 7.350 - 7.450 7.463(H) 7.392 7.322(L) 7.320(L) -   PCO2ART 32.0 - 48.0 mmHg 32.2 38.3 45.4 44.5 -  HCO3 20.0 - 28.0 mmol/L 23.1 23.6 23.6 22.9 -   TCO2 22 - 32 mmol/L 24 25 25 24 25    ACIDBASEDEF 0.0 - 2.0 mmol/L - 2.0 3.0(H) 3.0(H) -   O2SAT % 99.0 98.0 97.0 97.0 -      Capillary Blood Glucose: Lab Results  Component Value Date   GLUCAP 101 (H) 06/23/2019   GLUCAP 98 06/22/2019   GLUCAP 85 06/22/2019   GLUCAP 96 06/22/2019   GLUCAP 101 (H) 06/22/2019     Exercise Target Goals: Exercise Program Goal: Individual exercise prescription set using results from initial 6 min walk test and THRR while considering  patient's activity barriers and safety.   Exercise Prescription Goal: Starting with aerobic activity 30 plus minutes a day, 3 days per week for initial exercise prescription. Provide home exercise  prescription and guidelines that participant acknowledges understanding prior to discharge.  Activity Barriers & Risk Stratification: Activity Barriers & Cardiac Risk Stratification - 08/27/19 1404      Activity Barriers & Cardiac Risk Stratification   Activity Barriers  Back Problems;Shortness of Breath    Cardiac Risk Stratification  High       6 Minute Walk: 6 Minute Walk    Row Name 08/27/19 1403         6 Minute Walk   Phase  Initial     Distance  1100 feet     Walk Time  6 minutes     # of Rest Breaks  0     MPH  2.08     METS  2.6     RPE  11     Perceived Dyspnea   12     VO2 Peak  8.58     Symptoms  No     Resting HR  82 bpm     Resting BP  128/62     Resting Oxygen Saturation   99 %     Exercise Oxygen Saturation  during 6 min walk  91 %     Max Ex. HR  118 bpm     Max Ex. BP  142/68     2 Minute Post BP  132/70        Oxygen Initial Assessment:   Oxygen Re-Evaluation:   Oxygen Discharge (Final Oxygen Re-Evaluation):   Initial Exercise Prescription: Initial Exercise Prescription - 08/27/19 1400      Date of Initial Exercise RX and Referring Provider   Date  08/27/19    Referring Provider  Gerhardt    Expected Discharge Date  11/26/19      Treadmill   MPH  1.3    Grade  0    Minutes  17    METs  1.9      NuStep   Level  1    SPM  85    Minutes  22    METs  1.9      Prescription Details   Frequency (times per week)  3    Duration  Progress to 30 minutes of continuous aerobic without signs/symptoms of physical distress      Intensity   THRR 40-80% of Max Heartrate  [phone]    Ratings of Perceived Exertion  11-15    Perceived Dyspnea  0-4      Progression   Progression  Continue to progress workloads to maintain intensity without signs/symptoms of physical distress.      Resistance Training   Training Prescription  Yes    Weight  1    Reps  10-15       Perform Capillary Blood Glucose checks as needed.  Exercise  Prescription Changes:  Exercise Prescription Changes    Row Name 08/27/19 1400             Response to Exercise   Blood Pressure (Admit)  128/62       Blood Pressure (Exercise)  142/68       Blood Pressure (Exit)  132/70       Heart Rate (Admit)  72 bpm       Heart Rate (Exercise)  118 bpm       Heart Rate (Exit)  65 bpm       Oxygen Saturation (Admit)  99 %       Oxygen Saturation (Exercise)  91 %       Oxygen Saturation (Exit)  96 %       Rating of Perceived Exertion (Exercise)  11       Perceived Dyspnea (Exercise)  13       Symptoms  none       Comments  6MWT       Duration  Progress to 30 minutes of  aerobic without signs/symptoms of physical distress       Intensity  THRR New [phone]         Home Exercise Plan   Plans to continue exercise at  Home (comment) walking       Frequency  Add 2 additional days to program exercise sessions.       Initial Home Exercises Provided  08/27/19          Exercise Comments:   Exercise Goals and Review:  Exercise Goals    Row Name 08/27/19 1406             Exercise Goals   Increase Physical Activity  Yes       Intervention  Provide advice, education, support and counseling about physical activity/exercise needs.;Develop an individualized exercise prescription for aerobic and resistive training based on initial evaluation findings, risk stratification, comorbidities and participant's personal goals.       Expected Outcomes  Short Term: Attend rehab on a regular basis to increase amount of physical activity.;Long Term: Add in home exercise to make exercise part of routine and to increase amount of physical activity.;Long Term: Exercising regularly at least 3-5 days a week.       Increase Strength and Stamina  Yes       Intervention  Provide advice, education, support and counseling about physical activity/exercise needs.;Develop an individualized exercise prescription for aerobic and resistive training based on initial  evaluation findings, risk stratification, comorbidities and participant's personal goals.       Expected Outcomes  Short Term: Increase workloads from initial exercise prescription for resistance, speed, and METs.;Short Term: Perform resistance training exercises routinely during rehab and add in resistance training at home;Long Term: Improve cardiorespiratory fitness, muscular endurance and strength as measured by increased METs and functional capacity (6MWT)       Able to understand and use rate of perceived exertion (RPE) scale  Yes       Intervention  Provide education and explanation on how to use RPE scale       Expected Outcomes  Short Term: Able to use RPE daily in rehab to express subjective intensity level;Long Term:  Able to use RPE to guide intensity level when exercising independently       Able to understand  and use Dyspnea scale  Yes       Intervention  Provide education and explanation on how to use Dyspnea scale       Expected Outcomes  Short Term: Able to use Dyspnea scale daily in rehab to express subjective sense of shortness of breath during exertion;Long Term: Able to use Dyspnea scale to guide intensity level when exercising independently       Knowledge and understanding of Target Heart Rate Range (THRR)  Yes       Intervention  Provide education and explanation of THRR including how the numbers were predicted and where they are located for reference       Expected Outcomes  Short Term: Able to state/look up THRR;Long Term: Able to use THRR to govern intensity when exercising independently;Short Term: Able to use daily as guideline for intensity in rehab       Able to check pulse independently  Yes       Intervention  Provide education and demonstration on how to check pulse in carotid and radial arteries.;Review the importance of being able to check your own pulse for safety during independent exercise       Expected Outcomes  Short Term: Able to explain why pulse checking is  important during independent exercise;Long Term: Able to check pulse independently and accurately       Understanding of Exercise Prescription  Yes       Intervention  Provide education, explanation, and written materials on patient's individual exercise prescription       Expected Outcomes  Short Term: Able to explain program exercise prescription;Long Term: Able to explain home exercise prescription to exercise independently          Exercise Goals Re-Evaluation :    Discharge Exercise Prescription (Final Exercise Prescription Changes): Exercise Prescription Changes - 08/27/19 1400      Response to Exercise   Blood Pressure (Admit)  128/62    Blood Pressure (Exercise)  142/68    Blood Pressure (Exit)  132/70    Heart Rate (Admit)  72 bpm    Heart Rate (Exercise)  118 bpm    Heart Rate (Exit)  65 bpm    Oxygen Saturation (Admit)  99 %    Oxygen Saturation (Exercise)  91 %    Oxygen Saturation (Exit)  96 %    Rating of Perceived Exertion (Exercise)  11    Perceived Dyspnea (Exercise)  13    Symptoms  none    Comments  6MWT    Duration  Progress to 30 minutes of  aerobic without signs/symptoms of physical distress    Intensity  THRR New   [phone]     Home Exercise Plan   Plans to continue exercise at  Home (comment)   walking   Frequency  Add 2 additional days to program exercise sessions.    Initial Home Exercises Provided  08/27/19       Nutrition:  Target Goals: Understanding of nutrition guidelines, daily intake of sodium 1500mg , cholesterol 200mg , calories 30% from fat and 7% or less from saturated fats, daily to have 5 or more servings of fruits and vegetables.  Biometrics: Pre Biometrics - 08/27/19 1408      Pre Biometrics   Height  5\' 3"  (1.6 m)    Weight  175 lb 11.3 oz (79.7 kg)    Waist Circumference  37 inches    Hip Circumference  41 inches    Waist to Hip Ratio  0.9 %  BMI (Calculated)  31.13    Triceps Skinfold  12 mm    % Body Fat  37.5 %     Grip Strength  2 kg   kg   Flexibility  0 in   screw in back between L4-L5   Single Leg Stand  21.85 seconds        Nutrition Therapy Plan and Nutrition Goals: Nutrition Therapy & Goals - 08/27/19 1430      Personal Nutrition Goals   Personal Goal #2  Patient is doing her best to eat heart healthy. Given handouts on making heart healty food selections. Patient expressed an understanding.    Additional Goals?  No      Intervention Plan   Intervention  Nutrition handout(s) given to patient.    Expected Outcomes  Short Term Goal: Understand basic principles of dietary content, such as calories, fat, sodium, cholesterol and nutrients.       Nutrition Assessments: Nutrition Assessments - 08/27/19 1432      MEDFICTS Scores   Pre Score  54       Nutrition Goals Re-Evaluation:   Nutrition Goals Discharge (Final Nutrition Goals Re-Evaluation):   Psychosocial: Target Goals: Acknowledge presence or absence of significant depression and/or stress, maximize coping skills, provide positive support system. Participant is able to verbalize types and ability to use techniques and skills needed for reducing stress and depression.  Initial Review & Psychosocial Screening: Initial Psych Review & Screening - 08/27/19 1430      Initial Review   Current issues with  None Identified      Family Dynamics   Good Support System?  Yes      Barriers   Psychosocial barriers to participate in program  There are no identifiable barriers or psychosocial needs.      Screening Interventions   Interventions  Encouraged to exercise    Expected Outcomes  Short Term goal: Identification and review with participant of any Quality of Life or Depression concerns found by scoring the questionnaire.;Long Term goal: The participant improves quality of Life and PHQ9 Scores as seen by post scores and/or verbalization of changes       Quality of Life Scores: Quality of Life - 08/27/19 1411       Quality of Life   Select  Quality of Life      Quality of Life Scores   Health/Function Pre  25.04 %    Socioeconomic Pre  23.31 %    Psych/Spiritual Pre  27 %    Family Pre  27.6 %    GLOBAL Pre  25.41 %      Scores of 19 and below usually indicate a poorer quality of life in these areas.  A difference of  2-3 points is a clinically meaningful difference.  A difference of 2-3 points in the total score of the Quality of Life Index has been associated with significant improvement in overall quality of life, self-image, physical symptoms, and general health in studies assessing change in quality of life.  PHQ-9: Recent Review Flowsheet Data    Depression screen [facility] 2/9 08/27/2019   Decreased Interest 0   Down, Depressed, Hopeless 0   PHQ - 2 Score 0   Altered sleeping 0   Tired, decreased energy 0   Change in appetite 0   Feeling bad or failure about yourself  0   Trouble concentrating 0   Moving slowly or fidgety/restless 0   PHQ-9 Score 0     Interpretation  of Total Score  Total Score Depression Severity:  1-4 = Minimal depression, 5-9 = Mild depression, 10-14 = Moderate depression, 15-19 = Moderately severe depression, 20-27 = Severe depression   Psychosocial Evaluation and Intervention: Psychosocial Evaluation - 08/27/19 1430      Psychosocial Evaluation & Interventions   Interventions  Encouraged to exercise with the program and follow exercise prescription    Continue Psychosocial Services   No Follow up required       Psychosocial Re-Evaluation:   Psychosocial Discharge (Final Psychosocial Re-Evaluation):   Vocational Rehabilitation: Provide vocational rehab assistance to qualifying candidates.   Vocational Rehab Evaluation & Intervention: Vocational Rehab - 08/27/19 1432      Initial Vocational Rehab Evaluation & Intervention   Assessment shows need for Vocational Rehabilitation  No       Education: Education Goals: Education classes will be provided on  a weekly basis, covering required topics. Participant will state understanding/return demonstration of topics presented.  Learning Barriers/Preferences: Learning Barriers/Preferences - 08/27/19 1432      Learning Barriers/Preferences   Learning Barriers  None    Learning Preferences  Video;Written Material;Pictoral;Group Instruction;Skilled Demonstration       Education Topics: Hypertension, Hypertension Reduction -Define heart disease and high blood pressure. Discus how high blood pressure affects the body and ways to reduce high blood pressure.   Exercise and Your Heart -Discuss why it is important to exercise, the FITT principles of exercise, normal and abnormal responses to exercise, and how to exercise safely.   Angina -Discuss definition of angina, causes of angina, treatment of angina, and how to decrease risk of having angina.   Cardiac Medications -Review what the following cardiac medications are used for, how they affect the body, and side effects that may occur when taking the medications.  Medications include Aspirin, Beta blockers, calcium channel blockers, ACE Inhibitors, angiotensin receptor blockers, diuretics, digoxin, and antihyperlipidemics.   Congestive Heart Failure -Discuss the definition of CHF, how to live with CHF, the signs and symptoms of CHF, and how keep track of weight and sodium intake.   Heart Disease and Intimacy -Discus the effect sexual activity has on the heart, how changes occur during intimacy as we age, and safety during sexual activity.   Smoking Cessation / COPD -Discuss different methods to quit smoking, the health benefits of quitting smoking, and the definition of COPD.   Nutrition I: Fats -Discuss the types of cholesterol, what cholesterol does to the heart, and how cholesterol levels can be controlled.   Nutrition II: Labels -Discuss the different components of food labels and how to read food label   Heart Parts/Heart  Disease and PAD -Discuss the anatomy of the heart, the pathway of blood circulation through the heart, and these are affected by heart disease.   Stress I: Signs and Symptoms -Discuss the causes of stress, how stress may lead to anxiety and depression, and ways to limit stress.   Stress II: Relaxation -Discuss different types of relaxation techniques to limit stress.   Warning Signs of Stroke / TIA -Discuss definition of a stroke, what the signs and symptoms are of a stroke, and how to identify when someone is having stroke.   Knowledge Questionnaire Score: Knowledge Questionnaire Score - 08/27/19 1432      Knowledge Questionnaire Score   Pre Score  22/24       Core Components/Risk Factors/Patient Goals at Admission: Personal Goals and Risk Factors at Admission - 08/27/19 1433      Core Components/Risk  Factors/Patient Goals on Admission    Weight Management  Yes    Intervention  Weight Management: Develop a combined nutrition and exercise program designed to reach desired caloric intake, while maintaining appropriate intake of nutrient and fiber, sodium and fats, and appropriate energy expenditure required for the weight goal.    Admit Weight  175 lb 12.8 oz (79.7 kg)    Goal Weight: Short Term  170 lb 12.8 oz (77.5 kg)    Goal Weight: Long Term  165 lb 12.8 oz (75.2 kg)    Expected Outcomes  Short Term: Continue to assess and modify interventions until short term weight is achieved;Long Term: Adherence to nutrition and physical activity/exercise program aimed toward attainment of established weight goal    Personal Goal Other  Yes    Personal Goal  Lose 10-15lbs, Gain knowledge to better take care of my health.    Intervention  Attend CR 3 x week and supplement with 2 x week at home exercise.    Expected Outcomes  Reach expected goals.       Core Components/Risk Factors/Patient Goals Review:    Core Components/Risk Factors/Patient Goals at Discharge (Final Review):     ITP Comments: ITP Comments    Row Name 08/27/19 1345           ITP Comments  Patient has done well since her CABX3 in 05/2019. She is eager to get started.          Comments: Patient arrived for 1st visit/orientation/education at 1230.  Patient was referred to CR by Dr. Servando Snare due to CABGx3 (Z95.2).  During orientation advised patient on arrival and appointment times what to wear, what to do before, during and after exercise. Reviewed attendance and class policy. Talked about inclement weather and class consultation policy. Pt is scheduled to return Cardiac Rehab on 09/04/19 at 0930. Pt was advised to come to class 15 minutes before class starts. Patient was also given instructions on meeting with the dietician and attending the Family Structure classes. Discussed RPE/Dpysnea scales. Discussed initial THR and how to find their radial and/or carotid pulse. Discussed the initial exercise prescription and how this effects their progress. Pt is eager to get started. Patient participated in warm up stretches followed by light weights and resistance bands. Patient was able to complete 6 minute walk test. Patient did not c/o pain during the walk test. Patient was measured for the equipment. Discussed equipment safety with patient. Took patient pre-anthropometric measurements. Patient finished visit at 1415.

## 2019-09-04 ENCOUNTER — Other Ambulatory Visit: Payer: Self-pay

## 2019-09-04 ENCOUNTER — Encounter (HOSPITAL_COMMUNITY)
Admission: RE | Admit: 2019-09-04 | Discharge: 2019-09-04 | Disposition: A | Discharge status: Home / Self Care | Payer: Medicare Other | Source: Ambulatory Visit | Attending: Interventional Cardiology | Admitting: Interventional Cardiology

## 2019-09-04 DIAGNOSIS — Z951 Presence of aortocoronary bypass graft: Secondary | ICD-10-CM

## 2019-09-04 NOTE — Progress Notes (Signed)
Daily Session Note  Patient Details  Name: AMINAT SHELBURNE MRN: [id number] Date of Birth: Aug 07, 1946 Referring Provider:     CARDIAC REHAB PHASE II ORIENTATION from 08/27/2019 in [facility]  Referring Provider  Gerhardt      Encounter Date: 09/04/2019  Check In: Session Check In - 09/04/19 0930      Check-In   Supervising physician immediately available to respond to emergencies  See telemetry face sheet for immediately available ER MD    Location  [facility]    Staff Present  Aundra Dubin, RN, Cory Munch, Exercise Physiologist    Virtual Visit  No    Medication changes reported      No    Fall or balance concerns reported     No    Tobacco Cessation  No Change    Warm-up and Cool-down  Performed as group-led instruction    Resistance Training Performed  Yes    VAD Patient?  No    PAD/SET Patient?  No      Pain Assessment   Currently in Pain?  No/denies    Pain Score  0-No pain    Multiple Pain Sites  No       Capillary Blood Glucose: No results found for this or any previous visit (from the past 24 hour(s)).    Social History   Tobacco Use  Smoking Status Former Smoker  . Packs/day: 1.00  . Years: 19.00  . Pack years: 19.00  . Types: Cigarettes  . Quit date: 03/09/1984  . Years since quitting: 35.5  Smokeless Tobacco Never Used    Goals Met:  Independence with exercise equipment Exercise tolerated well No report of cardiac concerns or symptoms Strength training completed today  Goals Unmet:  Not Applicable  Comments: Pt able to follow exercise prescription today without complaint.  Will continue to monitor for progression. Check out 1030.   Dr. Kate Sable is Medical Director for [facility] Cardiac and Pulmonary Rehab.

## 2019-09-06 ENCOUNTER — Ambulatory Visit (INDEPENDENT_AMBULATORY_CARE_PROVIDER_SITE_OTHER): Payer: Medicare Other | Admitting: Pharmacist

## 2019-09-06 ENCOUNTER — Other Ambulatory Visit: Payer: Self-pay

## 2019-09-06 ENCOUNTER — Other Ambulatory Visit: Payer: Medicare Other | Admitting: *Deleted

## 2019-09-06 ENCOUNTER — Encounter: Payer: Self-pay | Admitting: Pharmacist

## 2019-09-06 ENCOUNTER — Encounter (HOSPITAL_COMMUNITY): Payer: Medicare Other

## 2019-09-06 DIAGNOSIS — I10 Essential (primary) hypertension: Secondary | ICD-10-CM | POA: Insufficient documentation

## 2019-09-06 DIAGNOSIS — Z79899 Other long term (current) drug therapy: Secondary | ICD-10-CM

## 2019-09-06 LAB — BASIC METABOLIC PANEL
BUN/Creatinine Ratio: 12 (ref 12–28)
BUN: 9 mg/dL (ref 8–27)
CO2: 27 mmol/L (ref 20–29)
Calcium: 9.7 mg/dL (ref 8.7–10.3)
Chloride: 105 mmol/L (ref 96–106)
Creatinine, Ser: 0.74 mg/dL (ref 0.57–1.00)
GFR calc Af Amer: 94 mL/min/{1.73_m2} (ref >59)
GFR calc non Af Amer: 81 mL/min/{1.73_m2} (ref >59)
Glucose: 103 mg/dL — ABNORMAL HIGH (ref 65–99)
Potassium: 4.3 mmol/L (ref 3.5–5.2)
Sodium: 142 mmol/L (ref 134–144)

## 2019-09-06 MED ORDER — METOPROLOL TARTRATE 50 MG PO TABS: 50.0000 mg | ORAL_TABLET | Freq: Two times a day (BID) | ORAL | 3 refills | Status: DC

## 2019-09-06 MED ORDER — ATORVASTATIN CALCIUM 40 MG PO TABS: 40.0000 mg | ORAL_TABLET | Freq: Every day | ORAL | 3 refills | Status: DC

## 2019-09-06 NOTE — Patient Instructions (Addendum)
It was a pleasure to meet you today.  Continue checking your blood pressure at home. I will call you with your lab results. We will plan to increase lisinopril to 20mg  as long as your labs look stable. We will call you to review either at the end of the day or Monday depending on when the labs come back.  Call us at [phone] with any questions or concerns.

## 2019-09-06 NOTE — Progress Notes (Signed)
Patient ID: Kathleen Terrell                 DOB: [birth year]                      MRN: [id number]     HPI: Kathleen Terrell is a 73 y.o. female patient of Dr. Irish Terrell referred by Kathleen Terrell to HTN clinic. PMH is significant for CAD s/p CABG 06/18/2019, HDL, HTN and hypothyroidism. At last follow up visit with Kathleen Terrell, patient blood pressure was elebated at 152/72. Admits it had been running in the 150-160's at home. Lisinopril was increased to 5mg . It was increased again to 10mg  on 8/25.   Patient presents today for blood pressure check. She is in good spirits. She has gone to the lab for BMP. Results pending.   She dizziness, lightheadedness, headache, blurred vision or SOB. States she has some swelling in leg that graft was taken, improves with elevating legs.  Patient started cardiac rehab on Wednesday. She does not have a walking program outside, but does purposely walk up and down her steps at her house.  Current HTN meds: lisinopril 10mg  daily, metoprolol 50mg  twice a day  Previously tried: lisinopril/HCTZ BP goal: <130/80  Family History: Heat attack, HTN, thyroid disease, CAD, DM in mother, CKD, heart attack, stroke in brother, HTN in daughter  Social History: quite smoking 35 years ago  Diet: 1 cup of coffee in AM, limits soda (maybe once a week), tea (maybe once a week)  Exercise: cardiac rehab (just started), no outside walking routine at home yet, but does purposely walk steps that are in her house   Home BP readings: 130-140's (wasn't resting before taking)  Wt Readings from Last 3 Encounters:  08/27/19 175 lb 11.3 oz (79.7 kg)  08/13/19 172 lb 12.8 oz (78.4 kg)  07/22/19 178 lb (80.7 kg)   BP Readings from Last 3 Encounters:  08/27/19 128/62  08/13/19 (!) 152/72  07/22/19 (!) 170/92   Pulse Readings from Last 3 Encounters:  08/27/19 82  08/13/19 76  07/22/19 67    Renal function: CrCl cannot be calculated (Patient's most recent lab result is older  than the maximum 21 days allowed.).  Past Medical History:  Diagnosis Date  . Abnormal stress test   . Acute serous otitis media    LEFT EAR  . Arthritis   . Atopic eczema    DERMATITIS  . Cataracts, bilateral   . Colon adenomas   . Diverticulitis 11/24/2011  . DM (diabetes mellitus) ([place])    patient denies, not on medications, diet controlled  . Headache   . Heme + stool 03/22/2017  . History of colonic polyps 03/22/2017  . HTN (hypertension)   . Hyperlipidemia   . Hypothyroidism   . Impacted cerumen of left ear   . Low back pain   . Melena   . PONV (postoperative nausea and vomiting)   . Spondylolisthesis of lumbar region 09/06/2016  . Syncope     Current Outpatient Medications on File Prior to Visit  Medication Sig Dispense Refill  . aspirin EC 325 MG tablet Take 1 tablet (325 mg total) by mouth daily. 90 tablet 3  . atorvastatin (LIPITOR) 40 MG tablet Take 1 tablet (40 mg total) by mouth daily. 90 tablet 3  . Cholecalciferol (VITAMIN D3) 2000 units TABS Take 2,000 Units by mouth daily.    Marland Kitchen HYDROcodone-acetaminophen (NORCO) 5-325 MG tablet Take 1 tablet by mouth 2 (  two) times daily as needed for up to 20 doses for moderate pain. 20 tablet 0  . hydrocortisone (PROCTOZONE-HC) 2.5 % rectal cream Place 1 application rectally 4 (four) times daily. UP TO 10 DAYS FOR RECTAL PAIN/BLEEDING (Patient not taking: Reported on 08/27/2019) 30 g 0  . levothyroxine (SYNTHROID, LEVOTHROID) 88 MCG tablet Take 88 mcg by mouth daily before breakfast.     . lisinopril (ZESTRIL) 5 MG tablet Take 2 tablets (10 mg total) by mouth daily. 90 tablet 3  . metoprolol tartrate (LOPRESSOR) 25 MG tablet Take 2 tablets (50 mg total) by mouth 2 (two) times daily. 180 tablet 3  . Misc Natural Products (TART CHERRY ADVANCED PO) Take 1 capsule by mouth daily.    . Multiple Vitamins-Minerals (MULTIVITAMIN PO) Take 1 tablet by mouth daily.    . Probiotic Product (PROBIOTIC FORMULA PO) Take 1 capsule by mouth daily.       . TURMERIC CURCUMIN PO Take 1 capsule by mouth daily.     No current facility-administered medications on file prior to visit.     Allergies  Allergen Reactions  . Methionine Other (See Comments)    UNSPECIFIED REACTION  Pt not sure  . Darvocet [Propoxyphene N-Acetaminophen] Nausea Only and Other (See Comments)  . Latex Rash  . Ultram [Tramadol Hcl] Nausea And Vomiting    There were no vitals taken for this visit.   Assessment/Plan:  1. Hypertension - Blood pressure above goal of <130/80 today in clinic today. Blood pressure appears to be above goal at home as well (although patient was not resting prior to taking). Proper technique was reviewed with patient. She did have one reading at goal prior to cardiac rehab. Will plan to increase lisinopril to 20mg  pending BMP results. Will schedule follow up in clinic in 3-4 weeks when we call her with lab results.   Thank you  Kathleen Terrell, Pharm.D, [name]  [id number], [place],  [address]  Phone: [phone]; Fax: [phone]

## 2019-09-09 ENCOUNTER — Telehealth: Payer: Self-pay

## 2019-09-09 ENCOUNTER — Telehealth: Payer: Self-pay | Admitting: Pharmacist

## 2019-09-09 ENCOUNTER — Encounter (HOSPITAL_COMMUNITY)
Admission: RE | Admit: 2019-09-09 | Discharge: 2019-09-09 | Disposition: A | Discharge status: Home / Self Care | Payer: Medicare Other | Source: Ambulatory Visit | Attending: Interventional Cardiology | Admitting: Interventional Cardiology

## 2019-09-09 ENCOUNTER — Other Ambulatory Visit: Payer: Self-pay

## 2019-09-09 DIAGNOSIS — I10 Essential (primary) hypertension: Secondary | ICD-10-CM

## 2019-09-09 DIAGNOSIS — Z951 Presence of aortocoronary bypass graft: Secondary | ICD-10-CM | POA: Diagnosis not present

## 2019-09-09 MED ORDER — LISINOPRIL 20 MG PO TABS: 20.0000 mg | ORAL_TABLET | Freq: Every day | ORAL | 3 refills | Status: DC

## 2019-09-09 NOTE — Telephone Encounter (Signed)
LMTCB

## 2019-09-09 NOTE — Telephone Encounter (Signed)
-----   Message from Consuelo Pandy, [place] sent at 09/08/2019 12:15 PM EDT ----- Labs stable. Increase lisinopril to 20 mg as recently directed by pharmacist

## 2019-09-09 NOTE — Telephone Encounter (Signed)
BMP stable. Will increase lisinopril to 20mg  daily as planned. Rx sent to [facility] as requested by patient.  Patient informed of labs results and of dose increase. She will take 4 tablets of lisinopril 5mg  until she get the shipment from [facility]. She will follow up in clinic on oct 8. Will repeat BMP at that time.

## 2019-09-09 NOTE — Progress Notes (Signed)
Daily Session Note  Patient Details  Name: Kathleen Terrell MRN: [id number] Date of Birth: 10/06/1946 Referring Provider:     [place] from 08/27/2019 in [place]  Referring Provider  Gerhardt      Encounter Date: 09/09/2019  Check In: Session Check In - 09/09/19 0930      Check-In   Supervising physician immediately available to respond to emergencies  See telemetry face sheet for immediately available ER MD    Location  [facility]    Staff Present  Aundra Dubin, RN, Cory Munch, Exercise Physiologist;Diane Coad, MS, EP, [facility], Exercise Physiologist    Virtual Visit  No    Medication changes reported      Yes    Comments  MD increased her Lisinopril from 10 mg to 20 mg daily.    Fall or balance concerns reported     No    Tobacco Cessation  No Change    Warm-up and Cool-down  Performed as group-led instruction    Resistance Training Performed  Yes    VAD Patient?  No    PAD/SET Patient?  No      Pain Assessment   Currently in Pain?  No/denies    Pain Score  0-No pain    Multiple Pain Sites  No       Capillary Blood Glucose: No results found for this or any previous visit (from the past 24 hour(s)).    Social History   Tobacco Use  Smoking Status Former Smoker  . Packs/day: 1.00  . Years: 19.00  . Pack years: 19.00  . Types: Cigarettes  . Quit date: 03/09/1984  . Years since quitting: 35.5  Smokeless Tobacco Never Used    Goals Met:  Independence with exercise equipment Exercise tolerated well No report of cardiac concerns or symptoms Strength training completed today  Goals Unmet:  Not Applicable  Comments: Pt able to follow exercise prescription today without complaint.  Will continue to monitor for progression. Check out 1030.   Dr. Kate Sable is Medical Director for [facility] Cardiac and Pulmonary Rehab.

## 2019-09-10 ENCOUNTER — Telehealth: Payer: Self-pay

## 2019-09-10 NOTE — Telephone Encounter (Signed)
Notes recorded by Frederik Schmidt, RN on 09/10/2019 at 8:12 AM EDT  The patient has been notified of the result and verbalized understanding. All questions (if any) were answered.  Frederik Schmidt, RN 09/10/2019 8:12 AM

## 2019-09-10 NOTE — Telephone Encounter (Signed)
-----   Message from Consuelo Pandy, [place] sent at 09/08/2019 12:15 PM EDT ----- Labs stable. Increase lisinopril to 20 mg as recently directed by pharmacist

## 2019-09-11 ENCOUNTER — Other Ambulatory Visit: Payer: Self-pay

## 2019-09-11 ENCOUNTER — Encounter (HOSPITAL_COMMUNITY)
Admission: RE | Admit: 2019-09-11 | Discharge: 2019-09-11 | Disposition: A | Discharge status: Home / Self Care | Payer: Medicare Other | Source: Ambulatory Visit | Attending: Interventional Cardiology | Admitting: Interventional Cardiology

## 2019-09-11 DIAGNOSIS — Z951 Presence of aortocoronary bypass graft: Secondary | ICD-10-CM | POA: Diagnosis not present

## 2019-09-11 NOTE — Progress Notes (Signed)
Daily Session Note  Patient Details  Name: ANALEA MULLER MRN: [id number] Date of Birth: 03/12/1946 Referring Provider:     CARDIAC REHAB PHASE II ORIENTATION from 08/27/2019 in [place]  Referring Provider  Gerhardt      Encounter Date: 09/11/2019  Check In: Session Check In - 09/11/19 0930      Check-In   Supervising physician immediately available to respond to emergencies  See telemetry face sheet for immediately available ER MD    Location  [facility]    Staff Present  Aundra Dubin, RN, Cory Munch, Exercise Physiologist;Diane Coad, MS, EP, [facility], Exercise Physiologist    Virtual Visit  No    Medication changes reported      No    Fall or balance concerns reported     No    Tobacco Cessation  No Change    Warm-up and Cool-down  Performed as group-led instruction    Resistance Training Performed  Yes    VAD Patient?  No    PAD/SET Patient?  No      Pain Assessment   Currently in Pain?  No/denies    Pain Score  0-No pain    Multiple Pain Sites  No       Capillary Blood Glucose: No results found for this or any previous visit (from the past 24 hour(s)).    Social History   Tobacco Use  Smoking Status Former Smoker  . Packs/day: 1.00  . Years: 19.00  . Pack years: 19.00  . Types: Cigarettes  . Quit date: 03/09/1984  . Years since quitting: 35.5  Smokeless Tobacco Never Used    Goals Met:  Independence with exercise equipment Exercise tolerated well No report of cardiac concerns or symptoms Strength training completed today  Goals Unmet:  Not Applicable  Comments: Pt able to follow exercise prescription today without complaint.  Will continue to monitor for progression. Check out 1030.   Dr. Kate Sable is Medical Director for [facility] Cardiac and Pulmonary Rehab.

## 2019-09-13 ENCOUNTER — Other Ambulatory Visit: Payer: Self-pay

## 2019-09-13 ENCOUNTER — Encounter (HOSPITAL_COMMUNITY)
Admission: RE | Admit: 2019-09-13 | Discharge: 2019-09-13 | Disposition: A | Discharge status: Home / Self Care | Payer: Medicare Other | Source: Ambulatory Visit | Attending: Interventional Cardiology | Admitting: Interventional Cardiology

## 2019-09-13 DIAGNOSIS — Z951 Presence of aortocoronary bypass graft: Secondary | ICD-10-CM | POA: Diagnosis not present

## 2019-09-13 NOTE — Progress Notes (Signed)
Daily Session Note  Patient Details  Name: OREE MIRELEZ MRN: [id number] Date of Birth: 1946/10/30 Referring Provider:     CARDIAC REHAB PHASE II ORIENTATION from 08/27/2019 in [place]  Referring Provider  Gerhardt      Encounter Date: 09/13/2019  Check In: Session Check In - 09/13/19 0930      Check-In   Supervising physician immediately available to respond to emergencies  See telemetry face sheet for immediately available ER MD    Location  [facility]    Staff Present  Aundra Dubin, RN, Cory Munch, Exercise Physiologist;Diane Coad, MS, EP, [facility], Exercise Physiologist    Virtual Visit  No    Medication changes reported      No    Fall or balance concerns reported     No    Tobacco Cessation  No Change    Warm-up and Cool-down  Performed as group-led instruction    Resistance Training Performed  Yes    VAD Patient?  No    PAD/SET Patient?  No      Pain Assessment   Currently in Pain?  No/denies    Pain Score  0-No pain    Multiple Pain Sites  No       Capillary Blood Glucose: No results found for this or any previous visit (from the past 24 hour(s)).    Social History   Tobacco Use  Smoking Status Former Smoker  . Packs/day: 1.00  . Years: 19.00  . Pack years: 19.00  . Types: Cigarettes  . Quit date: 03/09/1984  . Years since quitting: 35.5  Smokeless Tobacco Never Used    Goals Met:  Independence with exercise equipment Exercise tolerated well No report of cardiac concerns or symptoms Strength training completed today  Goals Unmet:  Not Applicable  Comments: Pt able to follow exercise prescription today without complaint.  Will continue to monitor for progression. Check out 1030.   Dr. Kate Sable is Medical Director for [facility] Cardiac and Pulmonary Rehab.

## 2019-09-16 ENCOUNTER — Encounter (HOSPITAL_COMMUNITY)
Admission: RE | Admit: 2019-09-16 | Discharge: 2019-09-16 | Disposition: A | Discharge status: Home / Self Care | Payer: Medicare Other | Source: Ambulatory Visit | Attending: Interventional Cardiology | Admitting: Interventional Cardiology

## 2019-09-16 ENCOUNTER — Other Ambulatory Visit: Payer: Self-pay

## 2019-09-16 DIAGNOSIS — Z951 Presence of aortocoronary bypass graft: Secondary | ICD-10-CM

## 2019-09-16 NOTE — Progress Notes (Signed)
Daily Session Note  Patient Details  Name: Kathleen Terrell MRN: [id number] Date of Birth: 27-Dec-1945 Referring Provider:     CARDIAC REHAB PHASE II ORIENTATION from 08/27/2019 in [place]  Referring Provider  Gerhardt      Encounter Date: 09/16/2019  Check In: Session Check In - 09/16/19 0930      Check-In   Supervising physician immediately available to respond to emergencies  See telemetry face sheet for immediately available ER MD    Location  [facility]    Staff Present  Aundra Dubin, RN, Cory Munch, Exercise Physiologist;Diane Coad, MS, EP, [facility], Exercise Physiologist    Virtual Visit  No    Medication changes reported      No    Fall or balance concerns reported     No    Tobacco Cessation  No Change    Warm-up and Cool-down  Performed as group-led instruction    Resistance Training Performed  Yes    VAD Patient?  No    PAD/SET Patient?  No      Pain Assessment   Currently in Pain?  No/denies    Pain Score  0-No pain    Multiple Pain Sites  No       Capillary Blood Glucose: No results found for this or any previous visit (from the past 24 hour(s)).    Social History   Tobacco Use  Smoking Status Former Smoker  . Packs/day: 1.00  . Years: 19.00  . Pack years: 19.00  . Types: Cigarettes  . Quit date: 03/09/1984  . Years since quitting: 35.5  Smokeless Tobacco Never Used    Goals Met:  Independence with exercise equipment Exercise tolerated well No report of cardiac concerns or symptoms Strength training completed today  Goals Unmet:  Not Applicable  Comments: Pt able to follow exercise prescription today without complaint.  Will continue to monitor for progression. Check out 1030.   Dr. Kate Sable is Medical Director for [facility] Cardiac and Pulmonary Rehab.

## 2019-09-16 NOTE — Progress Notes (Signed)
Cardiac Individual Treatment Plan  Patient Details  Name: Kathleen Terrell MRN: [id number] Date of Birth: 09/21/1946 Referring Provider:     CARDIAC REHAB PHASE II ORIENTATION from 08/27/2019 in [facility]  Referring Provider  Gerhardt      Initial Encounter Date:    CARDIAC REHAB PHASE II ORIENTATION from 08/27/2019 in [facility]  Date  08/27/19      Visit Diagnosis: S/P CABG x 3  Patient's Home Medications on Admission:  Current Outpatient Medications:  .  aspirin EC 325 MG tablet, Take 1 tablet (325 mg total) by mouth daily., Disp: 90 tablet, Rfl: 3 .  atorvastatin (LIPITOR) 40 MG tablet, Take 1 tablet (40 mg total) by mouth daily., Disp: 90 tablet, Rfl: 3 .  Cholecalciferol (VITAMIN D3) 2000 units TABS, Take 2,000 Units by mouth daily., Disp: , Rfl:  .  HYDROcodone-acetaminophen (NORCO) 5-325 MG tablet, Take 1 tablet by mouth 2 (two) times daily as needed for up to 20 doses for moderate pain. (Patient not taking: Reported on 09/06/2019), Disp: 20 tablet, Rfl: 0 .  hydrocortisone (PROCTOZONE-HC) 2.5 % rectal cream, Place 1 application rectally 4 (four) times daily. UP TO 10 DAYS FOR RECTAL PAIN/BLEEDING (Patient not taking: Reported on 08/27/2019), Disp: 30 g, Rfl: 0 .  levothyroxine (SYNTHROID, LEVOTHROID) 88 MCG tablet, Take 88 mcg by mouth daily before breakfast. , Disp: , Rfl:  .  lisinopril (ZESTRIL) 20 MG tablet, Take 1 tablet (20 mg total) by mouth daily., Disp: 90 tablet, Rfl: 3 .  metoprolol tartrate (LOPRESSOR) 50 MG tablet, Take 1 tablet (50 mg total) by mouth 2 (two) times daily., Disp: 180 tablet, Rfl: 3 .  Misc Natural Products (TART CHERRY ADVANCED PO), Take 1 capsule by mouth daily., Disp: , Rfl:  .  Multiple Vitamins-Minerals (MULTIVITAMIN PO), Take 1 tablet by mouth daily., Disp: , Rfl:  .  Probiotic Product (PROBIOTIC FORMULA PO), Take 1 capsule by mouth daily.  , Disp: , Rfl:  .  TURMERIC CURCUMIN PO, Take 1 capsule by mouth  daily., Disp: , Rfl:   Past Medical History: Past Medical History:  Diagnosis Date  . Abnormal stress test   . Acute serous otitis media    LEFT EAR  . Arthritis   . Atopic eczema    DERMATITIS  . Cataracts, bilateral   . Colon adenomas   . Diverticulitis 11/24/2011  . DM (diabetes mellitus) ([place])    patient denies, not on medications, diet controlled  . Headache   . Heme + stool 03/22/2017  . History of colonic polyps 03/22/2017  . HTN (hypertension)   . Hyperlipidemia   . Hypothyroidism   . Impacted cerumen of left ear   . Low back pain   . Melena   . PONV (postoperative nausea and vomiting)   . Spondylolisthesis of lumbar region 09/06/2016  . Syncope     Tobacco Use: Social History   Tobacco Use  Smoking Status Former Smoker  . Packs/day: 1.00  . Years: 19.00  . Pack years: 19.00  . Types: Cigarettes  . Quit date: 03/09/1984  . Years since quitting: 35.5  Smokeless Tobacco Never Used    Labs: Recent Chemical engineer    Labs for ITP Cardiac and Pulmonary Rehab Latest Ref Rng & Units 06/18/2019 06/18/2019 06/18/2019 06/18/2019 06/18/2019   Hemoglobin A1c 4.8 - 5.6 % - - - - -   PHART 7.350 - 7.450 7.463(H) 7.392 7.322(L) 7.320(L) -   PCO2ART 32.0 -  48.0 mmHg 32.2 38.3 45.4 44.5 -   HCO3 20.0 - 28.0 mmol/L 23.1 23.6 23.6 22.9 -   TCO2 22 - 32 mmol/L 24 25 25 24 25    ACIDBASEDEF 0.0 - 2.0 mmol/L - 2.0 3.0(H) 3.0(H) -   O2SAT % 99.0 98.0 97.0 97.0 -      Capillary Blood Glucose: Lab Results  Component Value Date   GLUCAP 101 (H) 06/23/2019   GLUCAP 98 06/22/2019   GLUCAP 85 06/22/2019   GLUCAP 96 06/22/2019   GLUCAP 101 (H) 06/22/2019     Exercise Target Goals: Exercise Program Goal: Individual exercise prescription set using results from initial 6 min walk test and THRR while considering  patient's activity barriers and safety.   Exercise Prescription Goal: Starting with aerobic activity 30 plus minutes a day, 3 days per week for initial exercise  prescription. Provide home exercise prescription and guidelines that participant acknowledges understanding prior to discharge.  Activity Barriers & Risk Stratification: Activity Barriers & Cardiac Risk Stratification - 08/27/19 1404      Activity Barriers & Cardiac Risk Stratification   Activity Barriers  Back Problems;Shortness of Breath    Cardiac Risk Stratification  High       6 Minute Walk: 6 Minute Walk    Row Name 08/27/19 1403         6 Minute Walk   Phase  Initial     Distance  1100 feet     Walk Time  6 minutes     # of Rest Breaks  0     MPH  2.08     METS  2.6     RPE  11     Perceived Dyspnea   12     VO2 Peak  8.58     Symptoms  No     Resting HR  82 bpm     Resting BP  128/62     Resting Oxygen Saturation   99 %     Exercise Oxygen Saturation  during 6 min walk  91 %     Max Ex. HR  118 bpm     Max Ex. BP  142/68     2 Minute Post BP  132/70        Oxygen Initial Assessment:   Oxygen Re-Evaluation:   Oxygen Discharge (Final Oxygen Re-Evaluation):   Initial Exercise Prescription: Initial Exercise Prescription - 08/27/19 1400      Date of Initial Exercise RX and Referring Provider   Date  08/27/19    Referring Provider  Gerhardt    Expected Discharge Date  11/26/19      Treadmill   MPH  1.3    Grade  0    Minutes  17    METs  1.9      NuStep   Level  1    SPM  85    Minutes  22    METs  1.9      Prescription Details   Frequency (times per week)  3    Duration  Progress to 30 minutes of continuous aerobic without signs/symptoms of physical distress      Intensity   THRR 40-80% of Max Heartrate  [phone]    Ratings of Perceived Exertion  11-15    Perceived Dyspnea  0-4      Progression   Progression  Continue to progress workloads to maintain intensity without signs/symptoms of physical distress.      Resistance Training  Training Prescription  Yes    Weight  1    Reps  10-15       Perform Capillary Blood Glucose  checks as needed.  Exercise Prescription Changes:  Exercise Prescription Changes    Row Name 08/27/19 1400 09/12/19 1000           Response to Exercise   Blood Pressure (Admit)  128/62  138/80      Blood Pressure (Exercise)  142/68  162/72      Blood Pressure (Exit)  132/70  130/80      Heart Rate (Admit)  72 bpm  60 bpm      Heart Rate (Exercise)  118 bpm  82 bpm      Heart Rate (Exit)  65 bpm  72 bpm      Oxygen Saturation (Admit)  99 %  -      Oxygen Saturation (Exercise)  91 %  -      Oxygen Saturation (Exit)  96 %  -      Rating of Perceived Exertion (Exercise)  11  12      Perceived Dyspnea (Exercise)  13  -      Symptoms  none  none      Comments  6MWT  First two weeks of exercise       Duration  Progress to 30 minutes of  aerobic without signs/symptoms of physical distress  Continue with 30 min of aerobic exercise without signs/symptoms of physical distress.      Intensity  THRR New [id number]  THRR unchanged        Progression   Progression  -  Continue to progress workloads to maintain intensity without signs/symptoms of physical distress.      Average METs  -  2.07        Resistance Training   Training Prescription  -  Yes      Weight  -  1      Reps  -  10-15        Treadmill   MPH  -  1.5      Grade  -  0      Minutes  -  17      METs  -  2.14        NuStep   Level  -  1      SPM  -  98      Minutes  -  22      METs  -  2        Home Exercise Plan   Plans to continue exercise at  Home (comment) walking  Home (comment)      Frequency  Add 2 additional days to program exercise sessions.  Add 2 additional days to program exercise sessions.      Initial Home Exercises Provided  08/27/19  08/27/19         Exercise Comments:  Exercise Comments    Row Name 09/12/19 1015 09/16/19 1310         Exercise Comments  Pt. is new to the program. She has tolerated the exercise with ease. She is commited to following her exercise prescription in order to help  her reach her goals.  Pt. is still fairly new to the program. She has tolerated the exercise with ease. She is commited to following her exercise prescription in order to help her reach her goals.         Exercise Goals  and Review:  Exercise Goals    Row Name 08/27/19 1406             Exercise Goals   Increase Physical Activity  Yes       Intervention  Provide advice, education, support and counseling about physical activity/exercise needs.;Develop an individualized exercise prescription for aerobic and resistive training based on initial evaluation findings, risk stratification, comorbidities and participant's personal goals.       Expected Outcomes  Short Term: Attend rehab on a regular basis to increase amount of physical activity.;Long Term: Add in home exercise to make exercise part of routine and to increase amount of physical activity.;Long Term: Exercising regularly at least 3-5 days a week.       Increase Strength and Stamina  Yes       Intervention  Provide advice, education, support and counseling about physical activity/exercise needs.;Develop an individualized exercise prescription for aerobic and resistive training based on initial evaluation findings, risk stratification, comorbidities and participant's personal goals.       Expected Outcomes  Short Term: Increase workloads from initial exercise prescription for resistance, speed, and METs.;Short Term: Perform resistance training exercises routinely during rehab and add in resistance training at home;Long Term: Improve cardiorespiratory fitness, muscular endurance and strength as measured by increased METs and functional capacity (6MWT)       Able to understand and use rate of perceived exertion (RPE) scale  Yes       Intervention  Provide education and explanation on how to use RPE scale       Expected Outcomes  Short Term: Able to use RPE daily in rehab to express subjective intensity level;Long Term:  Able to use RPE to guide  intensity level when exercising independently       Able to understand and use Dyspnea scale  Yes       Intervention  Provide education and explanation on how to use Dyspnea scale       Expected Outcomes  Short Term: Able to use Dyspnea scale daily in rehab to express subjective sense of shortness of breath during exertion;Long Term: Able to use Dyspnea scale to guide intensity level when exercising independently       Knowledge and understanding of Target Heart Rate Range (THRR)  Yes       Intervention  Provide education and explanation of THRR including how the numbers were predicted and where they are located for reference       Expected Outcomes  Short Term: Able to state/look up THRR;Long Term: Able to use THRR to govern intensity when exercising independently;Short Term: Able to use daily as guideline for intensity in rehab       Able to check pulse independently  Yes       Intervention  Provide education and demonstration on how to check pulse in carotid and radial arteries.;Review the importance of being able to check your own pulse for safety during independent exercise       Expected Outcomes  Short Term: Able to explain why pulse checking is important during independent exercise;Long Term: Able to check pulse independently and accurately       Understanding of Exercise Prescription  Yes       Intervention  Provide education, explanation, and written materials on patient's individual exercise prescription       Expected Outcomes  Short Term: Able to explain program exercise prescription;Long Term: Able to explain home exercise prescription to exercise independently  Exercise Goals Re-Evaluation : Exercise Goals Re-Evaluation    [place] Name 09/12/19 1014 09/16/19 1309           Exercise Goal Re-Evaluation   Exercise Goals Review  Increase Physical Activity;Increase Strength and Stamina;Able to understand and use rate of perceived exertion (RPE) scale;Able to understand and use  Dyspnea scale;Knowledge and understanding of Target Heart Rate Range (THRR);Able to check pulse independently;Understanding of Exercise Prescription  Increase Physical Activity;Increase Strength and Stamina;Able to understand and use rate of perceived exertion (RPE) scale;Able to understand and use Dyspnea scale;Knowledge and understanding of Target Heart Rate Range (THRR);Able to check pulse independently;Understanding of Exercise Prescription      Comments  Pt. is new to the program. She has completed 4 sessions so far. She is tolerating the exercise well.  Pt. has now completed 6 sessions. She is doing extremely well. We will begin increasing her workloads as tolerated.      Expected Outcomes  Short: lose 5 - 10 lbs Long: develop an exercise routine to better take care of herself.  Short: lose 5 - 10 lbs Long: develop an exercise routine to better take care of herself.          Discharge Exercise Prescription (Final Exercise Prescription Changes): Exercise Prescription Changes - 09/12/19 1000      Response to Exercise   Blood Pressure (Admit)  138/80    Blood Pressure (Exercise)  162/72    Blood Pressure (Exit)  130/80    Heart Rate (Admit)  60 bpm    Heart Rate (Exercise)  82 bpm    Heart Rate (Exit)  72 bpm    Rating of Perceived Exertion (Exercise)  12    Symptoms  none    Comments  First two weeks of exercise     Duration  Continue with 30 min of aerobic exercise without signs/symptoms of physical distress.    Intensity  THRR unchanged      Progression   Progression  Continue to progress workloads to maintain intensity without signs/symptoms of physical distress.    Average METs  2.07      Resistance Training   Training Prescription  Yes    Weight  1    Reps  10-15      Treadmill   MPH  1.5    Grade  0    Minutes  17    METs  2.14      NuStep   Level  1    SPM  98    Minutes  22    METs  2      Home Exercise Plan   Plans to continue exercise at  Home (comment)     Frequency  Add 2 additional days to program exercise sessions.    Initial Home Exercises Provided  08/27/19       Nutrition:  Target Goals: Understanding of nutrition guidelines, daily intake of sodium 1500mg , cholesterol 200mg , calories 30% from fat and 7% or less from saturated fats, daily to have 5 or more servings of fruits and vegetables.  Biometrics: Pre Biometrics - 08/27/19 1408      Pre Biometrics   Height  5\' 3"  (1.6 m)    Weight  79.7 kg    Waist Circumference  37 inches    Hip Circumference  41 inches    Waist to Hip Ratio  0.9 %    BMI (Calculated)  31.13    Triceps Skinfold  12 mm    %  Body Fat  37.5 %    Grip Strength  2 kg   kg   Flexibility  0 in   screw in back between L4-L5   Single Leg Stand  21.85 seconds        Nutrition Therapy Plan and Nutrition Goals: Nutrition Therapy & Goals - 09/16/19 1357      Personal Nutrition Goals   Nutrition Goal  We have not resumed out RD meetings. We are working with a new dietician to resume these classes. In the intermin, we are providing education through hand-outs. Patient says she is eating less sodium and is trying to eat heart healthy overall. Will continue to monitor for progress.      Intervention Plan   Intervention  Nutrition handout(s) given to patient.    Expected Outcomes  Short Term Goal: Understand basic principles of dietary content, such as calories, fat, sodium, cholesterol and nutrients.       Nutrition Assessments: Nutrition Assessments - 08/27/19 1432      MEDFICTS Scores   Pre Score  54       Nutrition Goals Re-Evaluation:   Nutrition Goals Discharge (Final Nutrition Goals Re-Evaluation):   Psychosocial: Target Goals: Acknowledge presence or absence of significant depression and/or stress, maximize coping skills, provide positive support system. Participant is able to verbalize types and ability to use techniques and skills needed for reducing stress and depression.  Initial Review  & Psychosocial Screening: Initial Psych Review & Screening - 08/27/19 1430      Initial Review   Current issues with  None Identified      Family Dynamics   Good Support System?  Yes      Barriers   Psychosocial barriers to participate in program  There are no identifiable barriers or psychosocial needs.      Screening Interventions   Interventions  Encouraged to exercise    Expected Outcomes  Short Term goal: Identification and review with participant of any Quality of Life or Depression concerns found by scoring the questionnaire.;Long Term goal: The participant improves quality of Life and PHQ9 Scores as seen by post scores and/or verbalization of changes       Quality of Life Scores: Quality of Life - 08/27/19 1411      Quality of Life   Select  Quality of Life      Quality of Life Scores   Health/Function Pre  25.04 %    Socioeconomic Pre  23.31 %    Psych/Spiritual Pre  27 %    Family Pre  27.6 %    GLOBAL Pre  25.41 %      Scores of 19 and below usually indicate a poorer quality of life in these areas.  A difference of  2-3 points is a clinically meaningful difference.  A difference of 2-3 points in the total score of the Quality of Life Index has been associated with significant improvement in overall quality of life, self-image, physical symptoms, and general health in studies assessing change in quality of life.  PHQ-9: Recent Review Flowsheet Data    Depression screen [facility] 2/9 08/27/2019   Decreased Interest 0   Down, Depressed, Hopeless 0   PHQ - 2 Score 0   Altered sleeping 0   Tired, decreased energy 0   Change in appetite 0   Feeling bad or failure about yourself  0   Trouble concentrating 0   Moving slowly or fidgety/restless 0   PHQ-9 Score 0  Interpretation of Total Score  Total Score Depression Severity:  1-4 = Minimal depression, 5-9 = Mild depression, 10-14 = Moderate depression, 15-19 = Moderately severe depression, 20-27 = Severe depression    Psychosocial Evaluation and Intervention: Psychosocial Evaluation - 08/27/19 1430      Psychosocial Evaluation & Interventions   Interventions  Encouraged to exercise with the program and follow exercise prescription    Continue Psychosocial Services   No Follow up required       Psychosocial Re-Evaluation: Psychosocial Re-Evaluation    [place] Name 09/16/19 1404             Psychosocial Re-Evaluation   Current issues with  None Identified       Comments  Patient's initial QOL score was 25.41 and her PHQ-9 score was 0 with no psychosocial issues identified. Will continue to monitor.       Expected Outcomes  Patient will have no psychosocial issues identified at discharge.       Interventions  Stress management education;Encouraged to attend Cardiac Rehabilitation for the exercise;Relaxation education       Continue Psychosocial Services   No Follow up required          Psychosocial Discharge (Final Psychosocial Re-Evaluation): Psychosocial Re-Evaluation - 09/16/19 1404      Psychosocial Re-Evaluation   Current issues with  None Identified    Comments  Patient's initial QOL score was 25.41 and her PHQ-9 score was 0 with no psychosocial issues identified. Will continue to monitor.    Expected Outcomes  Patient will have no psychosocial issues identified at discharge.    Interventions  Stress management education;Encouraged to attend Cardiac Rehabilitation for the exercise;Relaxation education    Continue Psychosocial Services   No Follow up required       Vocational Rehabilitation: Provide vocational rehab assistance to qualifying candidates.   Vocational Rehab Evaluation & Intervention: Vocational Rehab - 08/27/19 1432      Initial Vocational Rehab Evaluation & Intervention   Assessment shows need for Vocational Rehabilitation  No       Education: Education Goals: Education classes will be provided on a weekly basis, covering required topics. Participant will state  understanding/return demonstration of topics presented.  Learning Barriers/Preferences: Learning Barriers/Preferences - 08/27/19 1432      Learning Barriers/Preferences   Learning Barriers  None    Learning Preferences  Video;Written Material;Pictoral;Group Instruction;Skilled Demonstration       Education Topics: Hypertension, Hypertension Reduction -Define heart disease and high blood pressure. Discus how high blood pressure affects the body and ways to reduce high blood pressure.   Exercise and Your Heart -Discuss why it is important to exercise, the FITT principles of exercise, normal and abnormal responses to exercise, and how to exercise safely.   Angina -Discuss definition of angina, causes of angina, treatment of angina, and how to decrease risk of having angina.   Cardiac Medications -Review what the following cardiac medications are used for, how they affect the body, and side effects that may occur when taking the medications.  Medications include Aspirin, Beta blockers, calcium channel blockers, ACE Inhibitors, angiotensin receptor blockers, diuretics, digoxin, and antihyperlipidemics.   Congestive Heart Failure -Discuss the definition of CHF, how to live with CHF, the signs and symptoms of CHF, and how keep track of weight and sodium intake.   Heart Disease and Intimacy -Discus the effect sexual activity has on the heart, how changes occur during intimacy as we age, and safety during sexual activity.   Smoking  Cessation / COPD -Discuss different methods to quit smoking, the health benefits of quitting smoking, and the definition of COPD.   Nutrition I: Fats -Discuss the types of cholesterol, what cholesterol does to the heart, and how cholesterol levels can be controlled.   Nutrition II: Labels -Discuss the different components of food labels and how to read food label   Heart Parts/Heart Disease and PAD -Discuss the anatomy of the heart, the pathway of  blood circulation through the heart, and these are affected by heart disease.   CARDIAC REHAB PHASE II EXERCISE from 09/11/2019 in [facility]  Date  09/04/19  Educator  DJ  Instruction Review Code  2- Demonstrated Understanding      Stress I: Signs and Symptoms -Discuss the causes of stress, how stress may lead to anxiety and depression, and ways to limit stress.   CARDIAC REHAB PHASE II EXERCISE from 09/11/2019 in [place]  Date  09/11/19  Educator  Etheleen Mayhew  Instruction Review Code  2- Demonstrated Understanding      Stress II: Relaxation -Discuss different types of relaxation techniques to limit stress.   Warning Signs of Stroke / TIA -Discuss definition of a stroke, what the signs and symptoms are of a stroke, and how to identify when someone is having stroke.   Knowledge Questionnaire Score: Knowledge Questionnaire Score - 08/27/19 1432      Knowledge Questionnaire Score   Pre Score  22/24       Core Components/Risk Factors/Patient Goals at Admission: Personal Goals and Risk Factors at Admission - 08/27/19 1433      Core Components/Risk Factors/Patient Goals on Admission    Weight Management  Yes    Intervention  Weight Management: Develop a combined nutrition and exercise program designed to reach desired caloric intake, while maintaining appropriate intake of nutrient and fiber, sodium and fats, and appropriate energy expenditure required for the weight goal.    Admit Weight  175 lb 12.8 oz (79.7 kg)    Goal Weight: Short Term  170 lb 12.8 oz (77.5 kg)    Goal Weight: Long Term  165 lb 12.8 oz (75.2 kg)    Expected Outcomes  Short Term: Continue to assess and modify interventions until short term weight is achieved;Long Term: Adherence to nutrition and physical activity/exercise program aimed toward attainment of established weight goal    Personal Goal Other  Yes    Personal Goal  Lose 10-15lbs, Gain knowledge to  better take care of my health.    Intervention  Attend CR 3 x week and supplement with 2 x week at home exercise.    Expected Outcomes  Reach expected goals.       Core Components/Risk Factors/Patient Goals Review:  Goals and Risk Factor Review    Row Name 09/16/19 1359             Core Components/Risk Factors/Patient Goals Review   Personal Goals Review  Weight Management/Obesity;Other Lose 10-15 lbs. Gain knowledge to better take care of herself.       Review  Patient has completed 6 sessions maintaining her weight since her orientation visit. She is doing well in the program. She is making progress toward meeting her goals. Will continue to monitor.       Expected Outcomes  Patient will continue attend sessions and complete the program meeting her personal goals.          Core Components/Risk Factors/Patient Goals at Discharge (Final Review):  Goals and Risk Factor Review - 09/16/19 1359      Core Components/Risk Factors/Patient Goals Review   Personal Goals Review  Weight Management/Obesity;Other   Lose 10-15 lbs. Gain knowledge to better take care of herself.   Review  Patient has completed 6 sessions maintaining her weight since her orientation visit. She is doing well in the program. She is making progress toward meeting her goals. Will continue to monitor.    Expected Outcomes  Patient will continue attend sessions and complete the program meeting her personal goals.       ITP Comments: ITP Comments    Row Name 08/27/19 1345           ITP Comments  Patient has done well since her CABX3 in 05/2019. She is eager to get started.          Comments: ITP REVIEW Pt is making expected progress toward Cardiac Rehab goals after completing 6 sessions. Recommend continued exercise, life style modification, education, and increased stamina and strength.

## 2019-09-18 ENCOUNTER — Other Ambulatory Visit: Payer: Self-pay

## 2019-09-18 ENCOUNTER — Encounter (HOSPITAL_COMMUNITY)
Admission: RE | Admit: 2019-09-18 | Discharge: 2019-09-18 | Disposition: A | Discharge status: Home / Self Care | Payer: Medicare Other | Source: Ambulatory Visit | Attending: Interventional Cardiology | Admitting: Interventional Cardiology

## 2019-09-18 DIAGNOSIS — Z951 Presence of aortocoronary bypass graft: Secondary | ICD-10-CM | POA: Diagnosis not present

## 2019-09-18 NOTE — Progress Notes (Signed)
Daily Session Note  Patient Details  Name: DORELLA LASTER MRN: [id number] Date of Birth: [birth year] Referring Provider:     CARDIAC REHAB PHASE II ORIENTATION from 08/27/2019 in [place]  Referring Provider  Gerhardt      Encounter Date: 09/18/2019  Check In: Session Check In - 09/18/19 0930      Check-In   Supervising physician immediately available to respond to emergencies  See telemetry face sheet for immediately available ER MD    Location  [facility]    Staff Present  Aundra Dubin, RN, Cory Munch, Exercise Physiologist;Diane Coad, MS, EP, [facility], Exercise Physiologist    Virtual Visit  No    Medication changes reported      No    Fall or balance concerns reported     No    Tobacco Cessation  No Change    Warm-up and Cool-down  Performed as group-led instruction    Resistance Training Performed  Yes    VAD Patient?  No    PAD/SET Patient?  No      Pain Assessment   Currently in Pain?  No/denies    Pain Score  0-No pain    Multiple Pain Sites  No       Capillary Blood Glucose: No results found for this or any previous visit (from the past 24 hour(s)).    Social History   Tobacco Use  Smoking Status Former Smoker  . Packs/day: 1.00  . Years: 19.00  . Pack years: 19.00  . Types: Cigarettes  . Quit date: 03/09/1984  . Years since quitting: 35.5  Smokeless Tobacco Never Used    Goals Met:  Independence with exercise equipment Exercise tolerated well No report of cardiac concerns or symptoms Strength training completed today  Goals Unmet:  Not Applicable  Comments: Pt able to follow exercise prescription today without complaint.  Will continue to monitor for progression. Check out 1030.   Dr. Kate Sable is Medical Director for [facility] Cardiac and Pulmonary Rehab.

## 2019-09-20 ENCOUNTER — Other Ambulatory Visit: Payer: Self-pay

## 2019-09-20 ENCOUNTER — Encounter (HOSPITAL_COMMUNITY)
Admission: RE | Admit: 2019-09-20 | Discharge: 2019-09-20 | Disposition: A | Discharge status: Home / Self Care | Payer: Medicare Other | Source: Ambulatory Visit | Attending: Interventional Cardiology | Admitting: Interventional Cardiology

## 2019-09-20 DIAGNOSIS — Z951 Presence of aortocoronary bypass graft: Secondary | ICD-10-CM | POA: Diagnosis not present

## 2019-09-20 NOTE — Progress Notes (Signed)
Daily Session Note  Patient Details  Name: LAURNA SHETLEY MRN: [id number] Date of Birth: 04/20/1946 Referring Provider:     CARDIAC REHAB PHASE II ORIENTATION from 08/27/2019 in [place]  Referring Provider  Gerhardt      Encounter Date: 09/20/2019  Check In: Session Check In - 09/20/19 0930      Check-In   Supervising physician immediately available to respond to emergencies  See telemetry face sheet for immediately available ER MD    Location  [facility]    Staff Present  Aundra Dubin, RN, Cory Munch, Exercise Physiologist;Diane Coad, MS, EP, [facility], Exercise Physiologist    Virtual Visit  No    Medication changes reported      No    Fall or balance concerns reported     No    Tobacco Cessation  No Change    Warm-up and Cool-down  Performed as group-led instruction    Resistance Training Performed  Yes    VAD Patient?  No    PAD/SET Patient?  No      Pain Assessment   Currently in Pain?  No/denies    Pain Score  0-No pain    Multiple Pain Sites  No       Capillary Blood Glucose: No results found for this or any previous visit (from the past 24 hour(s)).    Social History   Tobacco Use  Smoking Status Former Smoker  . Packs/day: 1.00  . Years: 19.00  . Pack years: 19.00  . Types: Cigarettes  . Quit date: 03/09/1984  . Years since quitting: 35.5  Smokeless Tobacco Never Used    Goals Met:  Independence with exercise equipment Exercise tolerated well No report of cardiac concerns or symptoms Strength training completed today  Goals Unmet:  Not Applicable  Comments: Pt able to follow exercise prescription today without complaint.  Will continue to monitor for progression. Check out 1030.   Dr. Kate Sable is Medical Director for [facility] Cardiac and Pulmonary Rehab.

## 2019-09-23 ENCOUNTER — Other Ambulatory Visit: Payer: Self-pay

## 2019-09-23 ENCOUNTER — Encounter (HOSPITAL_COMMUNITY)
Admission: RE | Admit: 2019-09-23 | Discharge: 2019-09-23 | Disposition: A | Discharge status: Home / Self Care | Payer: Medicare Other | Source: Ambulatory Visit | Attending: Interventional Cardiology | Admitting: Interventional Cardiology

## 2019-09-23 DIAGNOSIS — Z951 Presence of aortocoronary bypass graft: Secondary | ICD-10-CM

## 2019-09-23 NOTE — Progress Notes (Signed)
Daily Session Note  Patient Details  Name: Kathleen Terrell MRN: [id number] Date of Birth: 1946/11/08 Referring Provider:     CARDIAC REHAB PHASE II ORIENTATION from 08/27/2019 in [place]  Referring Provider  Gerhardt      Encounter Date: 09/23/2019  Check In: Session Check In - 09/23/19 0930      Check-In   Supervising physician immediately available to respond to emergencies  See telemetry face sheet for immediately available MD    Location  [facility]    Staff Present  Aundra Dubin, RN, Cory Munch, Exercise Physiologist;Diane Coad, MS, EP, [facility], Exercise Physiologist    Virtual Visit  No    Medication changes reported      No    Fall or balance concerns reported     No    Tobacco Cessation  No Change    Warm-up and Cool-down  Performed as group-led instruction    Resistance Training Performed  Yes    VAD Patient?  No    PAD/SET Patient?  No      Pain Assessment   Currently in Pain?  No/denies    Pain Score  0-No pain    Multiple Pain Sites  No       Capillary Blood Glucose: No results found for this or any previous visit (from the past 24 hour(s)).    Social History   Tobacco Use  Smoking Status Former Smoker  . Packs/day: 1.00  . Years: 19.00  . Pack years: 19.00  . Types: Cigarettes  . Quit date: 03/09/1984  . Years since quitting: 35.5  Smokeless Tobacco Never Used    Goals Met:  Independence with exercise equipment Exercise tolerated well No report of cardiac concerns or symptoms Strength training completed today  Goals Unmet:  Not Applicable  Comments: Pt able to follow exercise prescription today without complaint.  Will continue to monitor for progression. Check out 1030.   Dr. Kate Sable is Medical Director for [facility] Cardiac and Pulmonary Rehab.

## 2019-09-25 ENCOUNTER — Encounter (HOSPITAL_COMMUNITY)
Admission: RE | Admit: 2019-09-25 | Discharge: 2019-09-25 | Disposition: A | Discharge status: Home / Self Care | Payer: Medicare Other | Source: Ambulatory Visit | Attending: Interventional Cardiology | Admitting: Interventional Cardiology

## 2019-09-25 ENCOUNTER — Other Ambulatory Visit: Payer: Self-pay

## 2019-09-25 DIAGNOSIS — Z951 Presence of aortocoronary bypass graft: Secondary | ICD-10-CM

## 2019-09-25 NOTE — Progress Notes (Signed)
Daily Session Note  Patient Details  Name: Kathleen Terrell MRN: [id number] Date of Birth: 04/16/1946 Referring Provider:     CARDIAC REHAB PHASE II ORIENTATION from 08/27/2019 in [place] CARDIAC [facility]IL[facility]  Referring Provider  Gerhardt      Encounter Date: 09/25/2019  Check In: Session Check In - 09/25/19 0930      Check-In   Supervising physician immediately available to respond to emergencies  See telemetry face sheet for immediately available ER MD    Location  [facility]    Staff Present  Debra Johnson, RN, BSN;Amanda Ballard, Exercise Physiologist;Diane Coad, MS, EP, [facility], Exercise Physiologist    Virtual Visit  No    Medication changes reported      No    Fall or balance concerns reported     No    Tobacco Cessation  No Change    Warm-up and Cool-down  Performed as group-led instruction    Resistance Training Performed  Yes    VAD Patient?  No    PAD/SET Patient?  No      Pain Assessment   Currently in Pain?  No/denies    Pain Score  0-No pain    Multiple Pain Sites  No       Capillary Blood Glucose: No results found for this or any previous visit (from the past 24 hour(s)).    Social History   Tobacco Use  Smoking Status Former Smoker  . Packs/day: 1.00  . Years: 19.00  . Pack years: 19.00  . Types: Cigarettes  . Quit date: 03/09/1984  . Years since quitting: 35.5  Smokeless Tobacco Never Used    Goals Met:  Independence with exercise equipment Exercise tolerated well No report of cardiac concerns or symptoms Strength training completed today  Goals Unmet:  Not Applicable  Comments: Pt able to follow exercise prescription today without complaint.  Will continue to monitor for progression. Check out 1030.   Dr. Suresh Koneswaran is Medical Director for [place] Cardiac and Pulmonary Rehab. 

## 2019-09-27 ENCOUNTER — Encounter (HOSPITAL_COMMUNITY)
Admission: RE | Admit: 2019-09-27 | Discharge: 2019-09-27 | Disposition: A | Discharge status: Home / Self Care | Payer: Medicare Other | Source: Ambulatory Visit | Attending: Interventional Cardiology | Admitting: Interventional Cardiology

## 2019-09-27 ENCOUNTER — Other Ambulatory Visit: Payer: Self-pay

## 2019-09-27 DIAGNOSIS — Z951 Presence of aortocoronary bypass graft: Secondary | ICD-10-CM | POA: Diagnosis not present

## 2019-09-27 NOTE — Progress Notes (Signed)
Daily Session Note  Patient Details  Name: Kathleen Terrell MRN: [id number] Date of Birth: 05-30-1946 Referring Provider:     CARDIAC REHAB PHASE II ORIENTATION from 08/27/2019 in [place]  Referring Provider  Gerhardt      Encounter Date: 09/27/2019  Check In: Session Check In - 09/27/19 0930      Check-In   Supervising physician immediately available to respond to emergencies  See telemetry face sheet for immediately available ER MD    Location  [facility]    Staff Present  Aundra Dubin, RN, Cory Munch, Exercise Physiologist;Diane Coad, MS, EP, [facility], Exercise Physiologist    Virtual Visit  No    Medication changes reported      No    Fall or balance concerns reported     No    Tobacco Cessation  No Change    Warm-up and Cool-down  Performed as group-led instruction    Resistance Training Performed  Yes    VAD Patient?  No    PAD/SET Patient?  No      Pain Assessment   Currently in Pain?  No/denies    Pain Score  0-No pain    Multiple Pain Sites  No       Capillary Blood Glucose: No results found for this or any previous visit (from the past 24 hour(s)).    Social History   Tobacco Use  Smoking Status Former Smoker  . Packs/day: 1.00  . Years: 19.00  . Pack years: 19.00  . Types: Cigarettes  . Quit date: 03/09/1984  . Years since quitting: 35.5  Smokeless Tobacco Never Used    Goals Met:  Independence with exercise equipment Exercise tolerated well No report of cardiac concerns or symptoms Strength training completed today  Goals Unmet:  Not Applicable  Comments: Pt able to follow exercise prescription today without complaint.  Will continue to monitor for progression. Check out 1030.   Dr. Kate Sable is Medical Director for [facility] Cardiac and Pulmonary Rehab.

## 2019-09-30 ENCOUNTER — Encounter (HOSPITAL_COMMUNITY)
Admission: RE | Admit: 2019-09-30 | Discharge: 2019-09-30 | Disposition: A | Discharge status: Home / Self Care | Payer: Medicare Other | Source: Ambulatory Visit | Attending: Interventional Cardiology | Admitting: Interventional Cardiology

## 2019-09-30 ENCOUNTER — Other Ambulatory Visit: Payer: Self-pay

## 2019-09-30 DIAGNOSIS — Z951 Presence of aortocoronary bypass graft: Secondary | ICD-10-CM | POA: Diagnosis not present

## 2019-09-30 NOTE — Progress Notes (Signed)
Daily Session Note  Patient Details  Name: Kathleen Terrell MRN: [id number] Date of Birth: [birth year] Referring Provider:     CARDIAC REHAB PHASE II ORIENTATION from 08/27/2019 in [place]  Referring Provider  Gerhardt      Encounter Date: 09/30/2019  Check In: Session Check In - 09/30/19 0930      Check-In   Supervising physician immediately available to respond to emergencies  See telemetry face sheet for immediately available ER MD    Location  [facility]    Staff Present  Aundra Dubin, RN, Cory Munch, Exercise Physiologist;Diane Coad, MS, EP, [facility], Exercise Physiologist    Virtual Visit  No    Medication changes reported      No    Fall or balance concerns reported     No    Tobacco Cessation  No Change    Warm-up and Cool-down  Performed as group-led instruction    VAD Patient?  No    PAD/SET Patient?  No      Pain Assessment   Currently in Pain?  No/denies    Pain Score  0-No pain    Multiple Pain Sites  No       Capillary Blood Glucose: No results found for this or any previous visit (from the past 24 hour(s)).    Social History   Tobacco Use  Smoking Status Former Smoker  . Packs/day: 1.00  . Years: 19.00  . Pack years: 19.00  . Types: Cigarettes  . Quit date: 03/09/1984  . Years since quitting: 35.5  Smokeless Tobacco Never Used    Goals Met:  Independence with exercise equipment Exercise tolerated well No report of cardiac concerns or symptoms Strength training completed today  Goals Unmet:  Not Applicable  Comments: Pt able to follow exercise prescription today without complaint.  Will continue to monitor for progression. Check out 1030.   Dr. Kate Sable is Medical Director for [facility] Cardiac and Pulmonary Rehab.

## 2019-10-02 ENCOUNTER — Encounter (HOSPITAL_COMMUNITY)
Admission: RE | Admit: 2019-10-02 | Discharge: 2019-10-02 | Disposition: A | Discharge status: Home / Self Care | Payer: Medicare Other | Source: Ambulatory Visit | Attending: Interventional Cardiology | Admitting: Interventional Cardiology

## 2019-10-02 ENCOUNTER — Other Ambulatory Visit: Payer: Self-pay

## 2019-10-02 DIAGNOSIS — Z951 Presence of aortocoronary bypass graft: Secondary | ICD-10-CM | POA: Diagnosis not present

## 2019-10-02 NOTE — Progress Notes (Signed)
Daily Session Note  Patient Details  Name: Kathleen Terrell MRN: [id number] Date of Birth: 07/21/1946 Referring Provider:     CARDIAC REHAB PHASE II ORIENTATION from 08/27/2019 in [place]  Referring Provider  Gerhardt      Encounter Date: 10/02/2019  Check In: Session Check In - 10/02/19 0930      Check-In   Supervising physician immediately available to respond to emergencies  See telemetry face sheet for immediately available ER MD    Location  [facility]    Staff Present  Aundra Dubin, RN, BSN;Diane Coad, MS, EP, [facility], Exercise Physiologist    Virtual Visit  No    Medication changes reported      No    Fall or balance concerns reported     No    Tobacco Cessation  No Change    Warm-up and Cool-down  Performed as group-led instruction    Resistance Training Performed  Yes    VAD Patient?  No    PAD/SET Patient?  No      Pain Assessment   Currently in Pain?  No/denies    Pain Score  0-No pain    Multiple Pain Sites  No       Capillary Blood Glucose: No results found for this or any previous visit (from the past 24 hour(s)).    Social History   Tobacco Use  Smoking Status Former Smoker  . Packs/day: 1.00  . Years: 19.00  . Pack years: 19.00  . Types: Cigarettes  . Quit date: 03/09/1984  . Years since quitting: 35.5  Smokeless Tobacco Never Used    Goals Met:  Independence with exercise equipment Exercise tolerated well No report of cardiac concerns or symptoms Strength training completed today  Goals Unmet:  Not Applicable  Comments: Pt able to follow exercise prescription today without complaint.  Will continue to monitor for progression. Check out 1030.   Dr. Kate Sable is Medical Director for [facility] Cardiac and Pulmonary Rehab.

## 2019-10-03 ENCOUNTER — Other Ambulatory Visit: Payer: Self-pay

## 2019-10-03 ENCOUNTER — Ambulatory Visit (INDEPENDENT_AMBULATORY_CARE_PROVIDER_SITE_OTHER): Payer: Medicare Other | Admitting: Pharmacist

## 2019-10-03 ENCOUNTER — Other Ambulatory Visit: Payer: Medicare Other | Admitting: *Deleted

## 2019-10-03 VITALS — BP 172/80 | HR 61

## 2019-10-03 DIAGNOSIS — I10 Essential (primary) hypertension: Secondary | ICD-10-CM | POA: Diagnosis not present

## 2019-10-03 MED ORDER — CHLORTHALIDONE 25 MG PO TABS: 12.5000 mg | ORAL_TABLET | Freq: Every day | ORAL | 11 refills | Status: DC

## 2019-10-03 NOTE — Progress Notes (Signed)
Patient ID: JAVITA SORRELLS                 DOB: 1946-02-09                      MRN: [id number]     HPI: Kathleen Terrell is a 73 y.o. female patient of Dr. Irish Lack referred by Lyda Jester to HTN clinic. PMH is significant for CAD s/p CABG 06/18/2019, HDL, HTN and hypothyroidism. At last follow up visit with HTN clinic patient BP was 142/78. Lisinopril was increased to 20mg  daily.  She denies dizziness, lightheadedness, headache, blurred vision or SOB. States she has some swelling in her knees and ankles. She states that when she does squats a cardiac rehab her knees feel tight and swollen. I suggested she try stretching her quads (and showed her a few stretches) as these may be tight and making her knees feel tight.   Patient currently doing cardiac rehab, She does not have a walking program outside.  She states that she hasn't been sleeping as well. She is up every 3 hours to use bathroom at night. She is trying to drink 64 oz water per day.  She noticed that her blood pressure has been higher since she switched brands of lisinopril. I have used the drug ID function of micromedex to be sure she was given the correct medication and indeed she has.   Current HTN meds: lisinopril 20mg  daily, metoprolol 50mg  twice a day  Previously tried: lisinopril/HCTZ BP goal: <130/80  Family History: Heat attack, HTN, thyroid disease, CAD, DM in mother, CKD, heart attack, stroke in brother, HTN in daughter  Social History: quite smoking 35 years ago  Diet: 1 cup of coffee in AM, limits soda (maybe once a week), tea (maybe once a week)  Exercise: cardiac rehab (just started), no outside walking routine at home yet, but does purposely walk steps that are in her house   Home BP readings: 136/78, 132/80, 132/80, 196/70, 184/80, 160/70, 160/86, 166/70, 190/70, 178/70  Wt Readings from Last 3 Encounters:  08/27/19 175 lb 11.3 oz (79.7 kg)  08/13/19 172 lb 12.8 oz (78.4 kg)  07/22/19 178 lb (80.7 kg)    BP Readings from Last 3 Encounters:  09/06/19 (!) 142/78  08/27/19 128/62  08/13/19 (!) 152/72   Pulse Readings from Last 3 Encounters:  09/06/19 (!) 57  08/27/19 82  08/13/19 76    Renal function: CrCl cannot be calculated (Patient's most recent lab result is older than the maximum 21 days allowed.).  Past Medical History:  Diagnosis Date   Abnormal stress test    Acute serous otitis media    LEFT EAR   Arthritis    Atopic eczema    DERMATITIS   Cataracts, bilateral    Colon adenomas    Diverticulitis 11/24/2011   DM (diabetes mellitus) ([place])    patient denies, not on medications, diet controlled   Headache    Heme + stool 03/22/2017   History of colonic polyps 03/22/2017   HTN (hypertension)    Hyperlipidemia    Hypothyroidism    Impacted cerumen of left ear    Low back pain    Melena    PONV (postoperative nausea and vomiting)    Spondylolisthesis of lumbar region 09/06/2016   Syncope     Current Outpatient Medications on File Prior to Visit  Medication Sig Dispense Refill   aspirin EC 325 MG tablet Take 1 tablet (325 mg  total) by mouth daily. 90 tablet 3   atorvastatin (LIPITOR) 40 MG tablet Take 1 tablet (40 mg total) by mouth daily. 90 tablet 3   Cholecalciferol (VITAMIN D3) 2000 units TABS Take 2,000 Units by mouth daily.     HYDROcodone-acetaminophen (NORCO) 5-325 MG tablet Take 1 tablet by mouth 2 (two) times daily as needed for up to 20 doses for moderate pain. (Patient not taking: Reported on 09/06/2019) 20 tablet 0   hydrocortisone (PROCTOZONE-HC) 2.5 % rectal cream Place 1 application rectally 4 (four) times daily. UP TO 10 DAYS FOR RECTAL PAIN/BLEEDING (Patient not taking: Reported on 08/27/2019) 30 g 0   levothyroxine (SYNTHROID, LEVOTHROID) 88 MCG tablet Take 88 mcg by mouth daily before breakfast.      lisinopril (ZESTRIL) 20 MG tablet Take 1 tablet (20 mg total) by mouth daily. 90 tablet 3   metoprolol tartrate (LOPRESSOR)  50 MG tablet Take 1 tablet (50 mg total) by mouth 2 (two) times daily. 180 tablet 3   Misc Natural Products (TART CHERRY ADVANCED PO) Take 1 capsule by mouth daily.     Multiple Vitamins-Minerals (MULTIVITAMIN PO) Take 1 tablet by mouth daily.     Probiotic Product (PROBIOTIC FORMULA PO) Take 1 capsule by mouth daily.       TURMERIC CURCUMIN PO Take 1 capsule by mouth daily.     No current facility-administered medications on file prior to visit.     Allergies  Allergen Reactions   Methionine Other (See Comments)    UNSPECIFIED REACTION  Pt not sure   Darvocet [Propoxyphene N-Acetaminophen] Nausea Only and Other (See Comments)   Latex Rash   Ultram [Tramadol Hcl] Nausea And Vomiting    There were no vitals taken for this visit.   Assessment/Plan:  1. Hypertension -Blood pressure is not at goal of <130/80 in clinic today. Unfortunately blood pressure is even higher than last time. Advised patient to decrease her water intake to 56 oz of water and to drink more water in the AM and less water in the afternoon to avoid having to go to the bathroom at night and improving her sleep. Also encouraged her to keep a log of her sodium intake and to start walking for 10-15 two days a week (two of the days she does not go to cardiac rehab). Will also add chlorthalidone 12.5mg  daily. Patient educated to take in the AM to avoid frequent urination at night, but also educated that the increase urination will only last about 2 weeks. Patient has to have a lipid panel done next Tuesday. Will add on CMP to this lab to avoid sticking her twice. She did have a BMP done today. Will call patient with those result tomorrow. Continue lisinopril 20mg  daily and metoprolol 50mg  BID. Follow up in 2 weeks in clinic.   Thank you  Ramond Dial, Pharm.D, [place]  [id number], [place], [place] [address]  Phone: [phone]; Fax: [phone]

## 2019-10-03 NOTE — Patient Instructions (Signed)
Start taking chlorthalidone 12.5mg  (1/2 tablet daily)   Try to walk for 10-15 min two days a week that you do not go to cardiac rehab.  Keep a log of your sodium intake for a few days. Use this as a gage of how well you are doing with sodium intake. Try to look for areas that you can improve upon. Goal would be less than 1.5g or 1500mg  of sodium per day. Make sure to pay attention to the serving size.  Keep up the good work! You are doing great!

## 2019-10-04 ENCOUNTER — Encounter (HOSPITAL_COMMUNITY)
Admission: RE | Admit: 2019-10-04 | Discharge: 2019-10-04 | Disposition: A | Discharge status: Home / Self Care | Payer: Medicare Other | Source: Ambulatory Visit | Attending: Interventional Cardiology | Admitting: Interventional Cardiology

## 2019-10-04 DIAGNOSIS — Z951 Presence of aortocoronary bypass graft: Secondary | ICD-10-CM | POA: Diagnosis not present

## 2019-10-04 LAB — BASIC METABOLIC PANEL
BUN/Creatinine Ratio: 10 — ABNORMAL LOW (ref 12–28)
BUN: 9 mg/dL (ref 8–27)
CO2: 26 mmol/L (ref 20–29)
Calcium: 9.5 mg/dL (ref 8.7–10.3)
Chloride: 103 mmol/L (ref 96–106)
Creatinine, Ser: 0.86 mg/dL (ref 0.57–1.00)
GFR calc Af Amer: 78 mL/min/{1.73_m2} (ref >59)
GFR calc non Af Amer: 68 mL/min/{1.73_m2} (ref >59)
Glucose: 78 mg/dL (ref 65–99)
Potassium: 4.4 mmol/L (ref 3.5–5.2)
Sodium: 142 mmol/L (ref 134–144)

## 2019-10-04 NOTE — Progress Notes (Signed)
Daily Session Note  Patient Details  Name: Kathleen Terrell MRN: [id number] Date of Birth: 10-08-1946 Referring Provider:     CARDIAC REHAB PHASE II ORIENTATION from 08/27/2019 in [place]  Referring Provider  Gerhardt      Encounter Date: 10/04/2019  Check In: Session Check In - 10/04/19 0930      Check-In   Supervising physician immediately available to respond to emergencies  See telemetry face sheet for immediately available ER MD    Location  [facility]    Staff Present  Aundra Dubin, RN, BSN;Diane Coad, MS, EP, [facility], Exercise Physiologist    Virtual Visit  No    Medication changes reported      Yes    Comments  Patient saw her cardiologist yesterday. She added Chlorthalidone 12.5 mg daily. Told to monitor her Sodium intake and to limit fluid intake in the evening.    Fall or balance concerns reported     No    Tobacco Cessation  No Change    Warm-up and Cool-down  Performed as group-led instruction    Resistance Training Performed  Yes    VAD Patient?  No    PAD/SET Patient?  No      Pain Assessment   Currently in Pain?  No/denies    Pain Score  0-No pain    Multiple Pain Sites  No       Capillary Blood Glucose: Results for orders placed or performed in visit on 10/03/19 (from the past 24 hour(s))  Basic metabolic panel     Status: Abnormal   Collection Time: 10/03/19  2:16 PM  Result Value Ref Range   Glucose 78 65 - 99 mg/dL   BUN 9 8 - 27 mg/dL   Creatinine, Ser 0.86 0.57 - 1.00 mg/dL   GFR calc non Af Amer 68 >59 mL/min/1.73   GFR calc Af Amer 78 >59 mL/min/1.73   BUN/Creatinine Ratio 10 (L) 12 - 28   Sodium 142 134 - 144 mmol/L   Potassium 4.4 3.5 - 5.2 mmol/L   Chloride 103 96 - 106 mmol/L   CO2 26 20 - 29 mmol/L   Calcium 9.5 8.7 - 10.3 mg/dL   Narrative   Performed at:  [address] [place], [place]  [id number] Lab Director: Rush Farmer MD, Phone:  [phone]      Social  History   Tobacco Use  Smoking Status Former Smoker  . Packs/day: 1.00  . Years: 19.00  . Pack years: 19.00  . Types: Cigarettes  . Quit date: 03/09/1984  . Years since quitting: 35.5  Smokeless Tobacco Never Used    Goals Met:  Independence with exercise equipment Exercise tolerated well No report of cardiac concerns or symptoms Strength training completed today  Goals Unmet:  Not Applicable  Comments: Pt able to follow exercise prescription today without complaint.  Will continue to monitor for progression. Check out 1030.   Dr. Kate Sable is Medical Director for [facility] Cardiac and Pulmonary Rehab.

## 2019-10-07 ENCOUNTER — Other Ambulatory Visit: Payer: Medicare Other

## 2019-10-07 ENCOUNTER — Other Ambulatory Visit: Payer: Self-pay

## 2019-10-07 ENCOUNTER — Encounter (HOSPITAL_COMMUNITY)
Admission: RE | Admit: 2019-10-07 | Discharge: 2019-10-07 | Disposition: A | Discharge status: Home / Self Care | Payer: Medicare Other | Source: Ambulatory Visit | Attending: Interventional Cardiology | Admitting: Interventional Cardiology

## 2019-10-07 DIAGNOSIS — Z951 Presence of aortocoronary bypass graft: Secondary | ICD-10-CM

## 2019-10-07 NOTE — Progress Notes (Signed)
Cardiac Individual Treatment Plan  Patient Details  Name: Kathleen Terrell MRN: [id number] Date of Birth: 05/04/1946 Referring Provider:     CARDIAC REHAB PHASE II ORIENTATION from 08/27/2019 in [place]  Referring Provider  Gerhardt      Initial Encounter Date:    CARDIAC REHAB PHASE II ORIENTATION from 08/27/2019 in [place]  Date  08/27/19      Visit Diagnosis: S/P CABG x 3  Patient's Home Medications on Admission:  Current Outpatient Medications:  .  aspirin EC 325 MG tablet, Take 1 tablet (325 mg total) by mouth daily., Disp: 90 tablet, Rfl: 3 .  atorvastatin (LIPITOR) 40 MG tablet, Take 1 tablet (40 mg total) by mouth daily., Disp: 90 tablet, Rfl: 3 .  chlorthalidone (HYGROTON) 25 MG tablet, Take 0.5 tablets (12.5 mg total) by mouth daily., Disp: 15 tablet, Rfl: 11 .  Cholecalciferol (VITAMIN D3) 2000 units TABS, Take 2,000 Units by mouth daily., Disp: , Rfl:  .  HYDROcodone-acetaminophen (NORCO) 5-325 MG tablet, Take 1 tablet by mouth 2 (two) times daily as needed for up to 20 doses for moderate pain. (Patient not taking: Reported on 09/06/2019), Disp: 20 tablet, Rfl: 0 .  hydrocortisone (PROCTOZONE-HC) 2.5 % rectal cream, Place 1 application rectally 4 (four) times daily. UP TO 10 DAYS FOR RECTAL PAIN/BLEEDING (Patient not taking: Reported on 08/27/2019), Disp: 30 g, Rfl: 0 .  levothyroxine (SYNTHROID, LEVOTHROID) 88 MCG tablet, Take 88 mcg by mouth daily before breakfast. , Disp: , Rfl:  .  lisinopril (ZESTRIL) 20 MG tablet, Take 1 tablet (20 mg total) by mouth daily., Disp: 90 tablet, Rfl: 3 .  metoprolol tartrate (LOPRESSOR) 50 MG tablet, Take 1 tablet (50 mg total) by mouth 2 (two) times daily., Disp: 180 tablet, Rfl: 3 .  Misc Natural Products (TART CHERRY ADVANCED PO), Take 1 capsule by mouth daily., Disp: , Rfl:  .  Multiple Vitamins-Minerals (MULTIVITAMIN PO), Take 1 tablet by mouth daily., Disp: , Rfl:  .  Probiotic Product  (PROBIOTIC FORMULA PO), Take 1 capsule by mouth daily.  , Disp: , Rfl:  .  TURMERIC CURCUMIN PO, Take 1 capsule by mouth daily., Disp: , Rfl:   Past Medical History: Past Medical History:  Diagnosis Date  . Abnormal stress test   . Acute serous otitis media    LEFT EAR  . Arthritis   . Atopic eczema    DERMATITIS  . Cataracts, bilateral   . Colon adenomas   . Diverticulitis 11/24/2011  . DM (diabetes mellitus) ([place])    patient denies, not on medications, diet controlled  . Headache   . Heme + stool 03/22/2017  . History of colonic polyps 03/22/2017  . HTN (hypertension)   . Hyperlipidemia   . Hypothyroidism   . Impacted cerumen of left ear   . Low back pain   . Melena   . PONV (postoperative nausea and vomiting)   . Spondylolisthesis of lumbar region 09/06/2016  . Syncope     Tobacco Use: Social History   Tobacco Use  Smoking Status Former Smoker  . Packs/day: 1.00  . Years: 19.00  . Pack years: 19.00  . Types: Cigarettes  . Quit date: 03/09/1984  . Years since quitting: 35.6  Smokeless Tobacco Never Used    Labs: Recent Chemical engineer    Labs for ITP Cardiac and Pulmonary Rehab Latest Ref Rng & Units 06/18/2019 06/18/2019 06/18/2019 06/18/2019 10/08/2019   Cholestrol 100 - 199 mg/dL - - - -  122   LDLCALC 0 - 99 mg/dL - - - - 58   HDL >39 mg/dL - - - - 48   Trlycerides 0 - 149 mg/dL - - - - 79   Hemoglobin A1c 4.8 - 5.6 % - - - - -   PHART 7.350 - 7.450 7.392 7.322(L) 7.320(L) - -   PCO2ART 32.0 - 48.0 mmHg 38.3 45.4 44.5 - -   HCO3 20.0 - 28.0 mmol/L 23.6 23.6 22.9 - -   TCO2 22 - 32 mmol/L '25 25 24 25 '$ -   ACIDBASEDEF 0.0 - 2.0 mmol/L 2.0 3.0(H) 3.0(H) - -   O2SAT % 98.0 97.0 97.0 - -      Capillary Blood Glucose: Lab Results  Component Value Date   GLUCAP 101 (H) 06/23/2019   GLUCAP 98 06/22/2019   GLUCAP 85 06/22/2019   GLUCAP 96 06/22/2019   GLUCAP 101 (H) 06/22/2019     Exercise Target Goals: Exercise Program Goal: Individual exercise  prescription set using results from initial 6 min walk test and THRR while considering  patient's activity barriers and safety.   Exercise Prescription Goal: Starting with aerobic activity 30 plus minutes a day, 3 days per week for initial exercise prescription. Provide home exercise prescription and guidelines that participant acknowledges understanding prior to discharge.  Activity Barriers & Risk Stratification: Activity Barriers & Cardiac Risk Stratification - 08/27/19 1404      Activity Barriers & Cardiac Risk Stratification   Activity Barriers  Back Problems;Shortness of Breath    Cardiac Risk Stratification  High       6 Minute Walk: 6 Minute Walk    Row Name 08/27/19 1403         6 Minute Walk   Phase  Initial     Distance  1100 feet     Walk Time  6 minutes     # of Rest Breaks  0     MPH  2.08     METS  2.6     RPE  11     Perceived Dyspnea   12     VO2 Peak  8.58     Symptoms  No     Resting HR  82 bpm     Resting BP  128/62     Resting Oxygen Saturation   99 %     Exercise Oxygen Saturation  during 6 min walk  91 %     Max Ex. HR  118 bpm     Max Ex. BP  142/68     2 Minute Post BP  132/70        Oxygen Initial Assessment:   Oxygen Re-Evaluation:   Oxygen Discharge (Final Oxygen Re-Evaluation):   Initial Exercise Prescription: Initial Exercise Prescription - 08/27/19 1400      Date of Initial Exercise RX and Referring Provider   Date  08/27/19    Referring Provider  Gerhardt    Expected Discharge Date  11/26/19      Treadmill   MPH  1.3    Grade  0    Minutes  17    METs  1.9      NuStep   Level  1    SPM  85    Minutes  22    METs  1.9      Prescription Details   Frequency (times per week)  3    Duration  Progress to 30 minutes of continuous aerobic without signs/symptoms of  physical distress      Intensity   THRR 40-80% of Max Heartrate  [phone]    Ratings of Perceived Exertion  11-15    Perceived Dyspnea  0-4       Progression   Progression  Continue to progress workloads to maintain intensity without signs/symptoms of physical distress.      Resistance Training   Training Prescription  Yes    Weight  1    Reps  10-15       Perform Capillary Blood Glucose checks as needed.  Exercise Prescription Changes:  Exercise Prescription Changes    Row Name 08/27/19 1400 09/12/19 1000 10/01/19 1000         Response to Exercise   Blood Pressure (Admit)  128/62  138/80  190/70     Blood Pressure (Exercise)  142/68  162/72  160/70     Blood Pressure (Exit)  132/70  130/80  150/70     Heart Rate (Admit)  72 bpm  60 bpm  57 bpm     Heart Rate (Exercise)  118 bpm  82 bpm  99 bpm     Heart Rate (Exit)  65 bpm  72 bpm  66 bpm     Oxygen Saturation (Admit)  99 %  -  -     Oxygen Saturation (Exercise)  91 %  -  -     Oxygen Saturation (Exit)  96 %  -  -     Rating of Perceived Exertion (Exercise)  '11  12  11     '$ Perceived Dyspnea (Exercise)  13  -  -     Symptoms  none  none  none     Comments  6MWT  First two weeks of exercise   increase in overall MET level      Duration  Progress to 30 minutes of  aerobic without signs/symptoms of physical distress  Continue with 30 min of aerobic exercise without signs/symptoms of physical distress.  Continue with 30 min of aerobic exercise without signs/symptoms of physical distress.     Intensity  THRR New [phone]  THRR unchanged  THRR unchanged       Progression   Progression  -  Continue to progress workloads to maintain intensity without signs/symptoms of physical distress.  Continue to progress workloads to maintain intensity without signs/symptoms of physical distress.     Average METs  -  2.07  2.36       Resistance Training   Training Prescription  -  Yes  Yes     Weight  -  1  2     Reps  -  10-15  10-15       Treadmill   MPH  -  1.5  2     Grade  -  0  0     Minutes  -  17  17     METs  -  2.14  2.53       NuStep   Level  -  1  2     SPM  -   98  114     Minutes  -  22  22     METs  -  2  2.2       Home Exercise Plan   Plans to continue exercise at  Home (comment) walking  Home (comment)  Home (comment)     Frequency  Add 2 additional days to program exercise sessions.  Add 2 additional days to program exercise sessions.  Add 2 additional days to program exercise sessions.     Initial Home Exercises Provided  08/27/19  08/27/19  08/27/19        Exercise Comments:  Exercise Comments    Row Name 09/12/19 1015 09/16/19 1310 10/01/19 1018 10/07/19 1710     Exercise Comments  Pt. is new to the program. She has tolerated the exercise with ease. She is commited to following her exercise prescription in order to help her reach her goals.  Pt. is still fairly new to the program. She has tolerated the exercise with ease. She is commited to following her exercise prescription in order to help her reach her goals.  Pt. continues to work hard in [place]. She is having some issues with high resting BP but otherwise is handling the exercise well. She is now walking at 2.0 MPH and on level 2 on the NS. We will continue to progress her as needed.  Keirsten has lost 4lbs since starting the program. Her goal is 10-15lbs. She has shared that the education has been helpful. She has tolerated the exercise progressions well and is doing well on the equipment.       Exercise Goals and Review:  Exercise Goals    Row Name 08/27/19 1406             Exercise Goals   Increase Physical Activity  Yes       Intervention  Provide advice, education, support and counseling about physical activity/exercise needs.;Develop an individualized exercise prescription for aerobic and resistive training based on initial evaluation findings, risk stratification, comorbidities and participant's personal goals.       Expected Outcomes  Short Term: Attend rehab on a regular basis to increase amount of physical activity.;Long Term: Add in home exercise to make exercise part of  routine and to increase amount of physical activity.;Long Term: Exercising regularly at least 3-5 days a week.       Increase Strength and Stamina  Yes       Intervention  Provide advice, education, support and counseling about physical activity/exercise needs.;Develop an individualized exercise prescription for aerobic and resistive training based on initial evaluation findings, risk stratification, comorbidities and participant's personal goals.       Expected Outcomes  Short Term: Increase workloads from initial exercise prescription for resistance, speed, and METs.;Short Term: Perform resistance training exercises routinely during rehab and add in resistance training at home;Long Term: Improve cardiorespiratory fitness, muscular endurance and strength as measured by increased METs and functional capacity (6MWT)       Able to understand and use rate of perceived exertion (RPE) scale  Yes       Intervention  Provide education and explanation on how to use RPE scale       Expected Outcomes  Short Term: Able to use RPE daily in rehab to express subjective intensity level;Long Term:  Able to use RPE to guide intensity level when exercising independently       Able to understand and use Dyspnea scale  Yes       Intervention  Provide education and explanation on how to use Dyspnea scale       Expected Outcomes  Short Term: Able to use Dyspnea scale daily in rehab to express subjective sense of shortness of breath during exertion;Long Term: Able to use Dyspnea scale to guide intensity level when exercising independently       Knowledge and understanding of  Target Heart Rate Range (THRR)  Yes       Intervention  Provide education and explanation of THRR including how the numbers were predicted and where they are located for reference       Expected Outcomes  Short Term: Able to state/look up THRR;Long Term: Able to use THRR to govern intensity when exercising independently;Short Term: Able to use daily as  guideline for intensity in rehab       Able to check pulse independently  Yes       Intervention  Provide education and demonstration on how to check pulse in carotid and radial arteries.;Review the importance of being able to check your own pulse for safety during independent exercise       Expected Outcomes  Short Term: Able to explain why pulse checking is important during independent exercise;Long Term: Able to check pulse independently and accurately       Understanding of Exercise Prescription  Yes       Intervention  Provide education, explanation, and written materials on patient's individual exercise prescription       Expected Outcomes  Short Term: Able to explain program exercise prescription;Long Term: Able to explain home exercise prescription to exercise independently          Exercise Goals Re-Evaluation : Exercise Goals Re-Evaluation    Row Name 09/12/19 1014 09/16/19 1309 10/07/19 1708         Exercise Goal Re-Evaluation   Exercise Goals Review  Increase Physical Activity;Increase Strength and Stamina;Able to understand and use rate of perceived exertion (RPE) scale;Able to understand and use Dyspnea scale;Knowledge and understanding of Target Heart Rate Range (THRR);Able to check pulse independently;Understanding of Exercise Prescription  Increase Physical Activity;Increase Strength and Stamina;Able to understand and use rate of perceived exertion (RPE) scale;Able to understand and use Dyspnea scale;Knowledge and understanding of Target Heart Rate Range (THRR);Able to check pulse independently;Understanding of Exercise Prescription  Increase Physical Activity;Increase Strength and Stamina;Knowledge and understanding of Target Heart Rate Range (THRR);Able to check pulse independently;Understanding of Exercise Prescription     Comments  Pt. is new to the program. She has completed 4 sessions so far. She is tolerating the exercise well.  Pt. has now completed 6 sessions. She is doing  extremely well. We will begin increasing her workloads as tolerated.  Patient has improved by attending the program. Her doctors are still working on getting her BP down.     Expected Outcomes  Short: lose 5 - 10 lbs Long: develop an exercise routine to better take care of herself.  Short: lose 5 - 10 lbs Long: develop an exercise routine to better take care of herself.  She is wanting to lose 10-15lbs. She also wants to gain knowledge to better take care of herself.         Discharge Exercise Prescription (Final Exercise Prescription Changes): Exercise Prescription Changes - 10/01/19 1000      Response to Exercise   Blood Pressure (Admit)  190/70    Blood Pressure (Exercise)  160/70    Blood Pressure (Exit)  150/70    Heart Rate (Admit)  57 bpm    Heart Rate (Exercise)  99 bpm    Heart Rate (Exit)  66 bpm    Rating of Perceived Exertion (Exercise)  11    Symptoms  none    Comments  increase in overall MET level     Duration  Continue with 30 min of aerobic exercise without signs/symptoms of physical distress.  Intensity  THRR unchanged      Progression   Progression  Continue to progress workloads to maintain intensity without signs/symptoms of physical distress.    Average METs  2.36      Resistance Training   Training Prescription  Yes    Weight  2    Reps  10-15      Treadmill   MPH  2    Grade  0    Minutes  17    METs  2.53      NuStep   Level  2    SPM  114    Minutes  22    METs  2.2      Home Exercise Plan   Plans to continue exercise at  Home (comment)    Frequency  Add 2 additional days to program exercise sessions.    Initial Home Exercises Provided  08/27/19       Nutrition:  Target Goals: Understanding of nutrition guidelines, daily intake of sodium '1500mg'$ , cholesterol '200mg'$ , calories 30% from fat and 7% or less from saturated fats, daily to have 5 or more servings of fruits and vegetables.  Biometrics: Pre Biometrics - 08/27/19 1408      Pre  Biometrics   Height  '5\' 3"'$  (1.6 m)    Weight  79.7 kg    Waist Circumference  37 inches    Hip Circumference  41 inches    Waist to Hip Ratio  0.9 %    BMI (Calculated)  31.13    Triceps Skinfold  12 mm    % Body Fat  37.5 %    Grip Strength  2 kg   kg   Flexibility  0 in   screw in back between L4-L5   Single Leg Stand  21.85 seconds        Nutrition Therapy Plan and Nutrition Goals: Nutrition Therapy & Goals - 10/07/19 1234      Personal Nutrition Goals   Nutrition Goal  We have not resumed out RD meetings. We are working with a new dietician to resume these classes. In the intermin, we are providing education through hand-outs. Patient says she is eating more fruits, chicken, and fish. She has cut out all red meat. Will continue to monitor for progress.      Intervention Plan   Intervention  Nutrition handout(s) given to patient.    Expected Outcomes  Short Term Goal: Understand basic principles of dietary content, such as calories, fat, sodium, cholesterol and nutrients.       Nutrition Assessments: Nutrition Assessments - 08/27/19 1432      MEDFICTS Scores   Pre Score  54       Nutrition Goals Re-Evaluation:   Nutrition Goals Discharge (Final Nutrition Goals Re-Evaluation):   Psychosocial: Target Goals: Acknowledge presence or absence of significant depression and/or stress, maximize coping skills, provide positive support system. Participant is able to verbalize types and ability to use techniques and skills needed for reducing stress and depression.  Initial Review & Psychosocial Screening: Initial Psych Review & Screening - 08/27/19 1430      Initial Review   Current issues with  None Identified      Family Dynamics   Good Support System?  Yes      Barriers   Psychosocial barriers to participate in program  There are no identifiable barriers or psychosocial needs.      Screening Interventions   Interventions  Encouraged to exercise  Expected  Outcomes  Short Term goal: Identification and review with participant of any Quality of Life or Depression concerns found by scoring the questionnaire.;Long Term goal: The participant improves quality of Life and PHQ9 Scores as seen by post scores and/or verbalization of changes       Quality of Life Scores: Quality of Life - 08/27/19 1411      Quality of Life   Select  Quality of Life      Quality of Life Scores   Health/Function Pre  25.04 %    Socioeconomic Pre  23.31 %    Psych/Spiritual Pre  27 %    Family Pre  27.6 %    GLOBAL Pre  25.41 %      Scores of 19 and below usually indicate a poorer quality of life in these areas.  A difference of  2-3 points is a clinically meaningful difference.  A difference of 2-3 points in the total score of the Quality of Life Index has been associated with significant improvement in overall quality of life, self-image, physical symptoms, and general health in studies assessing change in quality of life.  PHQ-9: Recent Review Flowsheet Data    Depression screen [facility] 2/9 08/27/2019   Decreased Interest 0   Down, Depressed, Hopeless 0   PHQ - 2 Score 0   Altered sleeping 0   Tired, decreased energy 0   Change in appetite 0   Feeling bad or failure about yourself  0   Trouble concentrating 0   Moving slowly or fidgety/restless 0   PHQ-9 Score 0     Interpretation of Total Score  Total Score Depression Severity:  1-4 = Minimal depression, 5-9 = Mild depression, 10-14 = Moderate depression, 15-19 = Moderately severe depression, 20-27 = Severe depression   Psychosocial Evaluation and Intervention: Psychosocial Evaluation - 08/27/19 1430      Psychosocial Evaluation & Interventions   Interventions  Encouraged to exercise with the program and follow exercise prescription    Continue Psychosocial Services   No Follow up required       Psychosocial Re-Evaluation: Psychosocial Re-Evaluation    Martin Name 09/16/19 1404 10/07/19 1239            Psychosocial Re-Evaluation   Current issues with  None Identified  -      Comments  Patient's initial QOL score was 25.41 and her PHQ-9 score was 0 with no psychosocial issues identified. Will continue to monitor.  Patient's initial QOL score was 25.41 and her PHQ-9 score was 0 with no psychosocial issues identified. Will continue to monitor.      Expected Outcomes  Patient will have no psychosocial issues identified at discharge.  Patient will have no psychosocial issues identified at discharge.      Interventions  Stress management education;Encouraged to attend Cardiac Rehabilitation for the exercise;Relaxation education  Stress management education;Encouraged to attend Cardiac Rehabilitation for the exercise;Relaxation education      Continue Psychosocial Services   No Follow up required  No Follow up required         Psychosocial Discharge (Final Psychosocial Re-Evaluation): Psychosocial Re-Evaluation - 10/07/19 1239      Psychosocial Re-Evaluation   Comments  Patient's initial QOL score was 25.41 and her PHQ-9 score was 0 with no psychosocial issues identified. Will continue to monitor.    Expected Outcomes  Patient will have no psychosocial issues identified at discharge.    Interventions  Stress management education;Encouraged to attend Cardiac Rehabilitation for  the exercise;Relaxation education    Continue Psychosocial Services   No Follow up required       Vocational Rehabilitation: Provide vocational rehab assistance to qualifying candidates.   Vocational Rehab Evaluation & Intervention: Vocational Rehab - 08/27/19 1432      Initial Vocational Rehab Evaluation & Intervention   Assessment shows need for Vocational Rehabilitation  No       Education: Education Goals: Education classes will be provided on a weekly basis, covering required topics. Participant will state understanding/return demonstration of topics presented.  Learning Barriers/Preferences: Learning  Barriers/Preferences - 08/27/19 1432      Learning Barriers/Preferences   Learning Barriers  None    Learning Preferences  Video;Written Material;Pictoral;Group Instruction;Skilled Demonstration       Education Topics: Hypertension, Hypertension Reduction -Define heart disease and high blood pressure. Discus how high blood pressure affects the body and ways to reduce high blood pressure.   CARDIAC REHAB PHASE II EXERCISE from 10/02/2019 in [place]  Date  10/02/19  Educator  D. Coad  Instruction Review Code  2- Demonstrated Understanding      Exercise and Your Heart -Discuss why it is important to exercise, the FITT principles of exercise, normal and abnormal responses to exercise, and how to exercise safely.   Angina -Discuss definition of angina, causes of angina, treatment of angina, and how to decrease risk of having angina.   Cardiac Medications -Review what the following cardiac medications are used for, how they affect the body, and side effects that may occur when taking the medications.  Medications include Aspirin, Beta blockers, calcium channel blockers, ACE Inhibitors, angiotensin receptor blockers, diuretics, digoxin, and antihyperlipidemics.   Congestive Heart Failure -Discuss the definition of CHF, how to live with CHF, the signs and symptoms of CHF, and how keep track of weight and sodium intake.   Heart Disease and Intimacy -Discus the effect sexual activity has on the heart, how changes occur during intimacy as we age, and safety during sexual activity.   Smoking Cessation / COPD -Discuss different methods to quit smoking, the health benefits of quitting smoking, and the definition of COPD.   Nutrition I: Fats -Discuss the types of cholesterol, what cholesterol does to the heart, and how cholesterol levels can be controlled.   Nutrition II: Labels -Discuss the different components of food labels and how to read food  label   Heart Parts/Heart Disease and PAD -Discuss the anatomy of the heart, the pathway of blood circulation through the heart, and these are affected by heart disease.   CARDIAC REHAB PHASE II EXERCISE from 10/02/2019 in [place]  Date  09/04/19  Educator  DJ  Instruction Review Code  2- Demonstrated Understanding      Stress I: Signs and Symptoms -Discuss the causes of stress, how stress may lead to anxiety and depression, and ways to limit stress.   CARDIAC REHAB PHASE II EXERCISE from 10/02/2019 in [place]  Date  09/11/19  Educator  Etheleen Mayhew  Instruction Review Code  2- Demonstrated Understanding      Stress II: Relaxation -Discuss different types of relaxation techniques to limit stress.   CARDIAC REHAB PHASE II EXERCISE from 10/02/2019 in [place]  Date  09/18/19  Educator  Etheleen Mayhew  Instruction Review Code  2- Demonstrated Understanding      Warning Signs of Stroke / TIA -Discuss definition of a stroke, what the signs and symptoms are of a stroke,  and how to identify when someone is having stroke.   Knowledge Questionnaire Score: Knowledge Questionnaire Score - 08/27/19 1432      Knowledge Questionnaire Score   Pre Score  22/24       Core Components/Risk Factors/Patient Goals at Admission: Personal Goals and Risk Factors at Admission - 08/27/19 1433      Core Components/Risk Factors/Patient Goals on Admission    Weight Management  Yes    Intervention  Weight Management: Develop a combined nutrition and exercise program designed to reach desired caloric intake, while maintaining appropriate intake of nutrient and fiber, sodium and fats, and appropriate energy expenditure required for the weight goal.    Admit Weight  175 lb 12.8 oz (79.7 kg)    Goal Weight: Short Term  170 lb 12.8 oz (77.5 kg)    Goal Weight: Long Term  165 lb 12.8 oz (75.2 kg)    Expected Outcomes  Short Term:  Continue to assess and modify interventions until short term weight is achieved;Long Term: Adherence to nutrition and physical activity/exercise program aimed toward attainment of established weight goal    Personal Goal Other  Yes    Personal Goal  Lose 10-15lbs, Gain knowledge to better take care of my health.    Intervention  Attend CR 3 x week and supplement with 2 x week at home exercise.    Expected Outcomes  Reach expected goals.       Core Components/Risk Factors/Patient Goals Review:  Goals and Risk Factor Review    Row Name 09/16/19 1359 10/07/19 1235           Core Components/Risk Factors/Patient Goals Review   Personal Goals Review  Weight Management/Obesity;Other Lose 10-15 lbs. Gain knowledge to better take care of herself.  Weight Management/Obesity;Other Lose 10-15 lbs; increase knowledge to better take care of herself.      Review  Patient has completed 6 sessions maintaining her weight since her orientation visit. She is doing well in the program. She is making progress toward meeting her goals. Will continue to monitor.  Patient has completed 14 sessions losing 2 lbs since last 30 day review. She continues to do well in the program. Her blood pressure continues to be hypertensive but 2 medication adjustments. Her cardiologist continues to monitor this. She has a f/u visit in the next few weeks. She is very concerned about her blood pressue and we continue to reasurre her we will get her controlled. Her cardiologist wants her >130/80. She reports feeling a lot better and stronger since she started the program. She says she is learning more about her condition and reports the education has been very informative. Will continue to monitor for progress.      Expected Outcomes  Patient will continue attend sessions and complete the program meeting her personal goals.  Patient will continue attend sessions and complete the program meeting her personal goals.         Core  Components/Risk Factors/Patient Goals at Discharge (Final Review):  Goals and Risk Factor Review - 10/07/19 1235      Core Components/Risk Factors/Patient Goals Review   Personal Goals Review  Weight Management/Obesity;Other   Lose 10-15 lbs; increase knowledge to better take care of herself.   Review  Patient has completed 14 sessions losing 2 lbs since last 30 day review. She continues to do well in the program. Her blood pressure continues to be hypertensive but 2 medication adjustments. Her cardiologist continues to monitor this. She has  a f/u visit in the next few weeks. She is very concerned about her blood pressue and we continue to reasurre her we will get her controlled. Her cardiologist wants her >130/80. She reports feeling a lot better and stronger since she started the program. She says she is learning more about her condition and reports the education has been very informative. Will continue to monitor for progress.    Expected Outcomes  Patient will continue attend sessions and complete the program meeting her personal goals.       ITP Comments: ITP Comments    Row Name 08/27/19 1345 10/04/19 0956         ITP Comments  Patient has done well since her CABX3 in 05/2019. She is eager to get started.  Patient saw a pharmacist yesterday. Her b/p goal for the patient is >130/80. She added Chlorthalidone 12.5 mg daily and told her to reduce her fluid intake to 56 oz daily and to watch her salt intake. She is also to keep a log of her b/p readings at home. She is to f/u in 2 weeks.         Comments: ITP REVIEW Pt is making expected progress toward Cardiac Rehab goals after completing 14 sessions. Recommend continued exercise, life style modification, education, and increased stamina and strength.

## 2019-10-07 NOTE — Progress Notes (Signed)
Daily Session Note  Patient Details  Name: Kathleen Terrell MRN: [id number] Date of Birth: 10/01/1946 Referring Provider:     CARDIAC REHAB PHASE II ORIENTATION from 08/27/2019 in [place]  Referring Provider  Gerhardt      Encounter Date: 10/07/2019  Check In: Session Check In - 10/07/19 0930      Check-In   Supervising physician immediately available to respond to emergencies  See telemetry face sheet for immediately available ER MD    Location  [facility]    Staff Present  Aundra Dubin, RN, BSN;Diane Coad, MS, EP, [facility], Exercise Physiologist    Virtual Visit  No    Medication changes reported      No    Fall or balance concerns reported     No    Tobacco Cessation  No Change    Warm-up and Cool-down  Performed as group-led instruction    Resistance Training Performed  Yes    VAD Patient?  No    PAD/SET Patient?  No      Pain Assessment   Currently in Pain?  No/denies    Pain Score  0-No pain    Multiple Pain Sites  No       Capillary Blood Glucose: No results found for this or any previous visit (from the past 24 hour(s)).    Social History   Tobacco Use  Smoking Status Former Smoker  . Packs/day: 1.00  . Years: 19.00  . Pack years: 19.00  . Types: Cigarettes  . Quit date: 03/09/1984  . Years since quitting: 35.6  Smokeless Tobacco Never Used    Goals Met:  Independence with exercise equipment Exercise tolerated well No report of cardiac concerns or symptoms Strength training completed today  Goals Unmet:  Not Applicable  Comments: Pt able to follow exercise prescription today without complaint.  Will continue to monitor for progression. Check out 1030.   Dr. Kate Sable is Medical Director for [facility] Cardiac and Pulmonary Rehab.

## 2019-10-08 ENCOUNTER — Other Ambulatory Visit: Payer: Medicare Other

## 2019-10-08 ENCOUNTER — Other Ambulatory Visit: Payer: Self-pay

## 2019-10-08 DIAGNOSIS — I25118 Atherosclerotic heart disease of native coronary artery with other forms of angina pectoris: Secondary | ICD-10-CM

## 2019-10-08 DIAGNOSIS — I10 Essential (primary) hypertension: Secondary | ICD-10-CM

## 2019-10-08 DIAGNOSIS — Z79899 Other long term (current) drug therapy: Secondary | ICD-10-CM

## 2019-10-08 LAB — LIPID PANEL
Chol/HDL Ratio: 2.5 ratio (ref 0.0–4.4)
Cholesterol, Total: 122 mg/dL (ref 100–199)
HDL: 48 mg/dL (ref >39)
LDL Chol Calc (NIH): 58 mg/dL (ref 0–99)
Triglycerides: 79 mg/dL (ref 0–149)
VLDL Cholesterol Cal: 16 mg/dL (ref 5–40)

## 2019-10-08 LAB — COMPREHENSIVE METABOLIC PANEL
ALT: 24 IU/L (ref 0–32)
AST: 21 IU/L (ref 0–40)
Albumin/Globulin Ratio: 1.3 (ref 1.2–2.2)
Albumin: 4 g/dL (ref 3.7–4.7)
Alkaline Phosphatase: 127 IU/L — ABNORMAL HIGH (ref 39–117)
BUN/Creatinine Ratio: 11 — ABNORMAL LOW (ref 12–28)
BUN: 9 mg/dL (ref 8–27)
Bilirubin Total: 0.2 mg/dL (ref 0.0–1.2)
CO2: 28 mmol/L (ref 20–29)
Calcium: 9.4 mg/dL (ref 8.7–10.3)
Chloride: 102 mmol/L (ref 96–106)
Creatinine, Ser: 0.81 mg/dL (ref 0.57–1.00)
GFR calc Af Amer: 84 mL/min/{1.73_m2} (ref >59)
GFR calc non Af Amer: 73 mL/min/{1.73_m2} (ref >59)
Globulin, Total: 3.1 g/dL (ref 1.5–4.5)
Glucose: 103 mg/dL — ABNORMAL HIGH (ref 65–99)
Potassium: 4.1 mmol/L (ref 3.5–5.2)
Sodium: 142 mmol/L (ref 134–144)
Total Protein: 7.1 g/dL (ref 6.0–8.5)

## 2019-10-09 ENCOUNTER — Encounter (HOSPITAL_COMMUNITY)
Admission: RE | Admit: 2019-10-09 | Discharge: 2019-10-09 | Disposition: A | Discharge status: Home / Self Care | Payer: Medicare Other | Source: Ambulatory Visit | Attending: Interventional Cardiology | Admitting: Interventional Cardiology

## 2019-10-09 ENCOUNTER — Telehealth: Payer: Self-pay

## 2019-10-09 DIAGNOSIS — Z951 Presence of aortocoronary bypass graft: Secondary | ICD-10-CM | POA: Diagnosis not present

## 2019-10-09 NOTE — Progress Notes (Signed)
Daily Session Note  Patient Details  Name: Kathleen Terrell MRN: [id number] Date of Birth: 1946-10-13 Referring Provider:     CARDIAC REHAB PHASE II ORIENTATION from 08/27/2019 in [place]  Referring Provider  Gerhardt      Encounter Date: 10/09/2019  Check In: Session Check In - 10/09/19 0930      Check-In   Supervising physician immediately available to respond to emergencies  See telemetry face sheet for immediately available ER MD    Location  [facility]    Staff Present  Aundra Dubin, RN, BSN;Diane Coad, MS, EP, [facility], Exercise Physiologist;Amanda Zachery Conch, Exercise Physiologist    Virtual Visit  No    Medication changes reported      No    Fall or balance concerns reported     No    Tobacco Cessation  No Change    Warm-up and Cool-down  Performed as group-led instruction    Resistance Training Performed  Yes    VAD Patient?  No    PAD/SET Patient?  No      Pain Assessment   Currently in Pain?  No/denies    Pain Score  0-No pain    Multiple Pain Sites  No       Capillary Blood Glucose: No results found for this or any previous visit (from the past 24 hour(s)).    Social History   Tobacco Use  Smoking Status Former Smoker  . Packs/day: 1.00  . Years: 19.00  . Pack years: 19.00  . Types: Cigarettes  . Quit date: 03/09/1984  . Years since quitting: 35.6  Smokeless Tobacco Never Used    Goals Met:  Independence with exercise equipment Exercise tolerated well No report of cardiac concerns or symptoms Strength training completed today  Goals Unmet:  Not Applicable  Comments: Pt able to follow exercise prescription today without complaint.  Will continue to monitor for progression. Check out 1030.   Dr. Kate Sable is Medical Director for [facility] Cardiac and Pulmonary Rehab.

## 2019-10-09 NOTE — Telephone Encounter (Signed)
-----   Message from Consuelo Pandy, [place] sent at 10/08/2019  6:05 PM EDT ----- Lipid panel shows well controlled cholesterol. Continue current regimen. No recommended changes at this time. Primary cardiologist will continue to follow lipid panel  every 6-12 months.

## 2019-10-09 NOTE — Telephone Encounter (Signed)
Notes recorded by Frederik Schmidt, RN on 10/09/2019 at 8:04 AM EDT  The patient has been notified of the result and verbalized understanding. All questions (if any) were answered.  Frederik Schmidt, RN 10/09/2019 8:04 AM

## 2019-10-10 ENCOUNTER — Telehealth: Payer: Self-pay | Admitting: Pharmacist

## 2019-10-10 NOTE — Telephone Encounter (Signed)
Advised patient of her lab results. Scr and K stable after starting chlorthalidone. LDL at goal. Patient will follow up in HTN clinic om 10/27. She reports her blood pressure was 126/60 at cardiac rehab the other day. She was very excited about this!

## 2019-10-11 ENCOUNTER — Encounter (HOSPITAL_COMMUNITY)
Admission: RE | Admit: 2019-10-11 | Discharge: 2019-10-11 | Disposition: A | Discharge status: Home / Self Care | Payer: Medicare Other | Source: Ambulatory Visit | Attending: Interventional Cardiology | Admitting: Interventional Cardiology

## 2019-10-11 ENCOUNTER — Other Ambulatory Visit: Payer: Self-pay

## 2019-10-11 DIAGNOSIS — Z951 Presence of aortocoronary bypass graft: Secondary | ICD-10-CM | POA: Diagnosis not present

## 2019-10-11 NOTE — Progress Notes (Signed)
Daily Session Note  Patient Details  Name: Kathleen Terrell MRN: [id number] Date of Birth: 11-22-1946 Referring Provider:     CARDIAC REHAB PHASE II ORIENTATION from 08/27/2019 in [place]  Referring Provider  Gerhardt      Encounter Date: 10/11/2019  Check In: Session Check In - 10/11/19 0915      Check-In   Supervising physician immediately available to respond to emergencies  See telemetry face sheet for immediately available MD    Location  [facility]    Staff Present  Russella Dar, MS, EP, [facility], Exercise Physiologist;Neave Lenger Zachery Conch, Exercise Physiologist    Virtual Visit  No    Medication changes reported      No    Fall or balance concerns reported     No    Tobacco Cessation  No Change    Warm-up and Cool-down  Performed as group-led instruction    Resistance Training Performed  Yes    VAD Patient?  No    PAD/SET Patient?  No      Pain Assessment   Currently in Pain?  No/denies    Pain Score  0-No pain    Multiple Pain Sites  No       Capillary Blood Glucose: No results found for this or any previous visit (from the past 24 hour(s)).    Social History   Tobacco Use  Smoking Status Former Smoker  . Packs/day: 1.00  . Years: 19.00  . Pack years: 19.00  . Types: Cigarettes  . Quit date: 03/09/1984  . Years since quitting: 35.6  Smokeless Tobacco Never Used    Goals Met:  Independence with exercise equipment Exercise tolerated well No report of cardiac concerns or symptoms Strength training completed today  Goals Unmet:  Not Applicable  Comments: Pt able to follow exercise prescription today without complaint.  Will continue to monitor for progression. Check out 1030.   Dr. Kate Sable is Medical Director for [facility] Cardiac and Pulmonary Rehab.

## 2019-10-14 ENCOUNTER — Encounter (HOSPITAL_COMMUNITY)
Admission: RE | Admit: 2019-10-14 | Discharge: 2019-10-14 | Disposition: A | Discharge status: Home / Self Care | Payer: Medicare Other | Source: Ambulatory Visit | Attending: Interventional Cardiology | Admitting: Interventional Cardiology

## 2019-10-14 ENCOUNTER — Other Ambulatory Visit: Payer: Self-pay

## 2019-10-14 DIAGNOSIS — Z951 Presence of aortocoronary bypass graft: Secondary | ICD-10-CM

## 2019-10-14 NOTE — Progress Notes (Signed)
Daily Session Note  Patient Details  Name: Kathleen Terrell MRN: [id number] Date of Birth: 20-Feb-1946 Referring Provider:     CARDIAC REHAB PHASE II ORIENTATION from 08/27/2019 in [place]  Referring Provider  Gerhardt      Encounter Date: 10/14/2019  Check In: Session Check In - 10/14/19 0930      Check-In   Supervising physician immediately available to respond to emergencies  See telemetry face sheet for immediately available MD    Location  [facility]    Staff Present  Russella Dar, MS, EP, [facility], Exercise Physiologist;Karan Inclan Zachery Conch, Exercise Physiologist    Virtual Visit  No    Medication changes reported      No    Fall or balance concerns reported     No    Tobacco Cessation  No Change    Warm-up and Cool-down  Performed as group-led instruction    Resistance Training Performed  Yes    VAD Patient?  No    PAD/SET Patient?  No      Pain Assessment   Currently in Pain?  No/denies    Pain Score  0-No pain    Multiple Pain Sites  No       Capillary Blood Glucose: No results found for this or any previous visit (from the past 24 hour(s)).    Social History   Tobacco Use  Smoking Status Former Smoker  . Packs/day: 1.00  . Years: 19.00  . Pack years: 19.00  . Types: Cigarettes  . Quit date: 03/09/1984  . Years since quitting: 35.6  Smokeless Tobacco Never Used    Goals Met:  Independence with exercise equipment Exercise tolerated well No report of cardiac concerns or symptoms Strength training completed today  Goals Unmet:  Not Applicable  Comments: Pt able to follow exercise prescription today without complaint.  Will continue to monitor for progression. Check out 1030.   Dr. Kate Sable is Medical Director for [facility] Cardiac and Pulmonary Rehab.

## 2019-10-16 ENCOUNTER — Encounter (HOSPITAL_COMMUNITY)
Admission: RE | Admit: 2019-10-16 | Discharge: 2019-10-16 | Disposition: A | Discharge status: Home / Self Care | Payer: Medicare Other | Source: Ambulatory Visit | Attending: Interventional Cardiology | Admitting: Interventional Cardiology

## 2019-10-16 ENCOUNTER — Other Ambulatory Visit: Payer: Self-pay

## 2019-10-16 DIAGNOSIS — Z951 Presence of aortocoronary bypass graft: Secondary | ICD-10-CM

## 2019-10-16 NOTE — Progress Notes (Signed)
Daily Session Note  Patient Details  Name: Kathleen Terrell MRN: [id number] Date of Birth: 10/10/1946 Referring Provider:     CARDIAC REHAB PHASE II ORIENTATION from 08/27/2019 in [place]  Referring Provider  Gerhardt      Encounter Date: 10/16/2019  Check In: Session Check In - 10/16/19 0930      Check-In   Supervising physician immediately available to respond to emergencies  See telemetry face sheet for immediately available MD    Location  [facility]    Staff Present  Russella Dar, MS, EP, [facility], Exercise Physiologist;Emiliano Welshans Zachery Conch, Exercise Physiologist    Virtual Visit  No    Medication changes reported      No    Fall or balance concerns reported     No    Tobacco Cessation  No Change    Warm-up and Cool-down  Performed as group-led instruction    Resistance Training Performed  Yes    VAD Patient?  No    PAD/SET Patient?  No      Pain Assessment   Currently in Pain?  No/denies    Pain Score  0-No pain    Multiple Pain Sites  No       Capillary Blood Glucose: No results found for this or any previous visit (from the past 24 hour(s)).    Social History   Tobacco Use  Smoking Status Former Smoker  . Packs/day: 1.00  . Years: 19.00  . Pack years: 19.00  . Types: Cigarettes  . Quit date: 03/09/1984  . Years since quitting: 35.6  Smokeless Tobacco Never Used    Goals Met:  Independence with exercise equipment Exercise tolerated well No report of cardiac concerns or symptoms Strength training completed today  Goals Unmet:  Not Applicable  Comments: Pt able to follow exercise prescription today without complaint.  Will continue to monitor for progression. Check out 1030.   Dr. Kate Sable is Medical Director for [facility] Cardiac and Pulmonary Rehab.

## 2019-10-17 DIAGNOSIS — E785 Hyperlipidemia, unspecified: Secondary | ICD-10-CM | POA: Diagnosis not present

## 2019-10-17 DIAGNOSIS — I10 Essential (primary) hypertension: Secondary | ICD-10-CM | POA: Diagnosis not present

## 2019-10-17 DIAGNOSIS — E039 Hypothyroidism, unspecified: Secondary | ICD-10-CM | POA: Diagnosis not present

## 2019-10-17 DIAGNOSIS — E0865 Diabetes mellitus due to underlying condition with hyperglycemia: Secondary | ICD-10-CM | POA: Diagnosis not present

## 2019-10-17 DIAGNOSIS — E782 Mixed hyperlipidemia: Secondary | ICD-10-CM | POA: Diagnosis not present

## 2019-10-18 ENCOUNTER — Encounter (HOSPITAL_COMMUNITY)
Admission: RE | Admit: 2019-10-18 | Discharge: 2019-10-18 | Disposition: A | Discharge status: Home / Self Care | Payer: Medicare Other | Source: Ambulatory Visit | Attending: Interventional Cardiology | Admitting: Interventional Cardiology

## 2019-10-18 ENCOUNTER — Other Ambulatory Visit: Payer: Self-pay

## 2019-10-18 DIAGNOSIS — Z951 Presence of aortocoronary bypass graft: Secondary | ICD-10-CM

## 2019-10-18 NOTE — Progress Notes (Signed)
Daily Session Note  Patient Details  Name: Kathleen Terrell MRN: [id number] Date of Birth: April 01, 1946 Referring Provider:     CARDIAC REHAB PHASE II ORIENTATION from 08/27/2019 in [place]  Referring Provider  Gerhardt      Encounter Date: 10/18/2019  Check In: Session Check In - 10/18/19 0930      Check-In   Supervising physician immediately available to respond to emergencies  See telemetry face sheet for immediately available MD    Location  [facility]    Staff Present  Russella Dar, MS, EP, [facility], Exercise Physiologist;Edith Lord Zachery Conch, Exercise Physiologist    Virtual Visit  No    Medication changes reported      No    Fall or balance concerns reported     No    Tobacco Cessation  No Change    Warm-up and Cool-down  Performed as group-led instruction    Resistance Training Performed  Yes    VAD Patient?  No    PAD/SET Patient?  No      Pain Assessment   Currently in Pain?  No/denies    Pain Score  0-No pain    Multiple Pain Sites  No       Capillary Blood Glucose: No results found for this or any previous visit (from the past 24 hour(s)).    Social History   Tobacco Use  Smoking Status Former Smoker  . Packs/day: 1.00  . Years: 19.00  . Pack years: 19.00  . Types: Cigarettes  . Quit date: 03/09/1984  . Years since quitting: 35.6  Smokeless Tobacco Never Used    Goals Met:  Independence with exercise equipment Exercise tolerated well No report of cardiac concerns or symptoms Strength training completed today  Goals Unmet:  Not Applicable  Comments: Pt able to follow exercise prescription today without complaint.  Will continue to monitor for progression. Check out 1030.   Dr. Kate Sable is Medical Director for [facility] Cardiac and Pulmonary Rehab.

## 2019-10-21 ENCOUNTER — Other Ambulatory Visit: Payer: Self-pay

## 2019-10-21 ENCOUNTER — Encounter (HOSPITAL_COMMUNITY)
Admission: RE | Admit: 2019-10-21 | Discharge: 2019-10-21 | Disposition: A | Discharge status: Home / Self Care | Payer: Medicare Other | Source: Ambulatory Visit | Attending: Interventional Cardiology | Admitting: Interventional Cardiology

## 2019-10-21 DIAGNOSIS — Z951 Presence of aortocoronary bypass graft: Secondary | ICD-10-CM

## 2019-10-21 NOTE — Progress Notes (Signed)
Patient ID: [name]                 DOB: 11/03/1946                      MRN: [id number]     HPI: Kathleen Terrell is a 73 y.o. female Dr. Irish Lack referred by Kathleen Terrell to HTN clinic. PMH is significant for CAD s/p CABG 06/18/2019, HDL, HTN and hypothyroidism. At last follow up visit on 10/03/19 with HTN clinic patient chlorthalidone 12.5 mg daily was initiated. Also, pt was complaining of increased frequency of nocturia. It was recommended that patient to decrease her water intake to 56 oz of water and to drink more water in the AM and less water in the afternoon to avoid having to go to the bathroom at night and improving her sleep. Pt's CMP on 10/11/19 showed pt's Scr and K were stable after initiation of chlorthalidone.   Pt states she is doing better since last visit. She states she thinks her blood pressure readings are "trending in a better direction". Pt states she was recently told her ALP was elevated and her provider decreased her levothyroxine from 88 mcg to 75 mcg daily. She states she is tolerating chlorthalidone; not experiencing dizziness/lightheadedness/SOB. She states she finds it difficult to split chlorthalidone tablet. Patient has increased the frequency she exercises since last appt.   Patient specific reminders: Kathleen Terrell (61 month old nephew)  Current HTN meds: lisinopril 20mg  daily, metoprolol 50mg  twice a day, chlorthalidone 12.5 mg daily  Previously tried: lisinopril/HCTZ BP goal: <130/80  Family History: Heat attack, HTN, thyroid disease, CAD, DM in mother, CKD, heart attack, stroke in brother, HTN in daughter  Social History: quit smoking 35 years ago  Diet: 1 cup of coffee in AM, limits soda (maybe once a week), tea (maybe once a week)  Exercise: cardiac rehab (just started), walking outside 3x per week    Home BP readings: Cardiac rehab fluctuates between BP rest, BP high, and BP post BP rest 128-196/60-86 mmHg BP high 142-190/68-82 mmHg BP  post120-160/60-80 mmHg  Home readings around 1:30 PM after lunch (takes meds in between 7-9 AM) ~140-150s/80s mmHg  Wt Readings from Last 3 Encounters:  08/27/19 175 lb 11.3 oz (79.7 kg)  08/13/19 172 lb 12.8 oz (78.4 kg)  07/22/19 178 lb (80.7 kg)   BP Readings from Last 3 Encounters:  10/03/19 (!) 172/80  09/06/19 (!) 142/78  08/27/19 128/62   Pulse Readings from Last 3 Encounters:  10/03/19 61  09/06/19 (!) 57  08/27/19 82    Renal function: CrCl cannot be calculated (Unknown ideal weight.).  Past Medical History:  Diagnosis Date  . Abnormal stress test   . Acute serous otitis media    LEFT EAR  . Arthritis   . Atopic eczema    DERMATITIS  . Cataracts, bilateral   . Colon adenomas   . Diverticulitis 11/24/2011  . DM (diabetes mellitus) ([place])    patient denies, not on medications, diet controlled  . Headache   . Heme + stool 03/22/2017  . History of colonic polyps 03/22/2017  . HTN (hypertension)   . Hyperlipidemia   . Hypothyroidism   . Impacted cerumen of left ear   . Low back pain   . Melena   . PONV (postoperative nausea and vomiting)   . Spondylolisthesis of lumbar region 09/06/2016  . Syncope     Current Outpatient Medications on File Prior to Visit  Medication Sig Dispense Refill  . aspirin EC 325 MG tablet Take 1 tablet (325 mg total) by mouth daily. 90 tablet 3  . atorvastatin (LIPITOR) 40 MG tablet Take 1 tablet (40 mg total) by mouth daily. 90 tablet 3  . chlorthalidone (HYGROTON) 25 MG tablet Take 0.5 tablets (12.5 mg total) by mouth daily. 15 tablet 11  . Cholecalciferol (VITAMIN D3) 2000 units TABS Take 2,000 Units by mouth daily.    Marland Kitchen HYDROcodone-acetaminophen (NORCO) 5-325 MG tablet Take 1 tablet by mouth 2 (two) times daily as needed for up to 20 doses for moderate pain. (Patient not taking: Reported on 09/06/2019) 20 tablet 0  . hydrocortisone (PROCTOZONE-HC) 2.5 % rectal cream Place 1 application rectally 4 (four) times daily. UP TO 10 DAYS  FOR RECTAL PAIN/BLEEDING (Patient not taking: Reported on 08/27/2019) 30 g 0  . levothyroxine (SYNTHROID, LEVOTHROID) 88 MCG tablet Take 88 mcg by mouth daily before breakfast.     . lisinopril (ZESTRIL) 20 MG tablet Take 1 tablet (20 mg total) by mouth daily. 90 tablet 3  . metoprolol tartrate (LOPRESSOR) 50 MG tablet Take 1 tablet (50 mg total) by mouth 2 (two) times daily. 180 tablet 3  . Misc Natural Products (TART CHERRY ADVANCED PO) Take 1 capsule by mouth daily.    . Multiple Vitamins-Minerals (MULTIVITAMIN PO) Take 1 tablet by mouth daily.    . Probiotic Product (PROBIOTIC FORMULA PO) Take 1 capsule by mouth daily.      . TURMERIC CURCUMIN PO Take 1 capsule by mouth daily.     No current facility-administered medications on file prior to visit.     Allergies  Allergen Reactions  . Methionine Other (See Comments)    UNSPECIFIED REACTION  Pt not sure  . Darvocet [Propoxyphene N-Acetaminophen] Nausea Only and Other (See Comments)  . Latex Rash  . Ultram [Tramadol Hcl] Nausea And Vomiting     Assessment/Plan:  1. Hypertension - goal < 130/80 mmHg; therefore, pt is not at goal.  Considering it is challenging for pt to split chlorthalidone dose, increase chlorthalidone from 12.5 mg once daily to 25 mg once daily. Continue lisinopril 20 mg daily and metoprolol 50 mg BID. Encouraged pt for increasing frequency of exercise. Obtain updated BMP/LFTs in 2 weeks. Follow up appt scheduled for 3 weeks.   Thank you for involving pharmacy to assist in providing [name]'s care.   Drexel Iha, PharmD PGY2 Ambulatory Care Pharmacy Resident

## 2019-10-21 NOTE — Progress Notes (Signed)
Daily Session Note  Patient Details  Name: EMELINA HINCH MRN: [id number] Date of Birth: 08/30/1946 Referring Provider:     CARDIAC REHAB PHASE II ORIENTATION from 08/27/2019 in [place]  Referring Provider  Gerhardt      Encounter Date: 10/21/2019  Check In: Session Check In - 10/21/19 0930      Check-In   Supervising physician immediately available to respond to emergencies  See telemetry face sheet for immediately available MD    Location  [facility]    Staff Present  Russella Dar, MS, EP, [facility], Exercise Physiologist;Amanda Ballard, Exercise Physiologist;Jonita Hirota Wynetta Emery, RN, BSN    Virtual Visit  No    Medication changes reported      No    Fall or balance concerns reported     No    Tobacco Cessation  No Change    Warm-up and Cool-down  Performed as group-led instruction    Resistance Training Performed  Yes    VAD Patient?  No    PAD/SET Patient?  No      Pain Assessment   Currently in Pain?  No/denies    Pain Score  0-No pain    Multiple Pain Sites  No       Capillary Blood Glucose: No results found for this or any previous visit (from the past 24 hour(s)).    Social History   Tobacco Use  Smoking Status Former Smoker  . Packs/day: 1.00  . Years: 19.00  . Pack years: 19.00  . Types: Cigarettes  . Quit date: 03/09/1984  . Years since quitting: 35.6  Smokeless Tobacco Never Used    Goals Met:  Independence with exercise equipment Exercise tolerated well No report of cardiac concerns or symptoms Strength training completed today  Goals Unmet:  Not Applicable  Comments: Pt able to follow exercise prescription today without complaint.  Will continue to monitor for progression. Check out 1030.    Dr. Kate Sable is Medical Director for [facility] Cardiac and Pulmonary Rehab.

## 2019-10-21 NOTE — Patient Instructions (Addendum)
It was a pleasure seeing you in clinic today Kathleen Terrell!  Today the plan is... 1. Start taking chlorthalidone 25 mg daily 2. Follow up labs are scheduled for November 12th at 2:00 PM to check your Terrell function, electrolytes, and liver function 3. Office visit scheduled for high blood pressure clinic with pharmacist is scheduled for November 17th at 2:30 PM 4. Continue lisinopril and metoprolol   Please call the PharmD clinic at [phone] if you have any questions that you would like to speak with a pharmacist about Kathleen Terrell, [place], [place]).

## 2019-10-22 ENCOUNTER — Ambulatory Visit (INDEPENDENT_AMBULATORY_CARE_PROVIDER_SITE_OTHER): Payer: Medicare Other | Admitting: Pharmacist

## 2019-10-22 VITALS — BP 142/74 | HR 54

## 2019-10-22 DIAGNOSIS — I10 Essential (primary) hypertension: Secondary | ICD-10-CM | POA: Diagnosis not present

## 2019-10-22 MED ORDER — CHLORTHALIDONE 25 MG PO TABS: 25.0000 mg | ORAL_TABLET | Freq: Every day | ORAL | 0 refills | Status: DC

## 2019-10-22 MED ORDER — CHLORTHALIDONE 25 MG PO TABS: 25.0000 mg | ORAL_TABLET | Freq: Every day | ORAL | 3 refills | Status: DC

## 2019-10-23 ENCOUNTER — Other Ambulatory Visit: Payer: Self-pay

## 2019-10-23 ENCOUNTER — Encounter (HOSPITAL_COMMUNITY)
Admission: RE | Admit: 2019-10-23 | Discharge: 2019-10-23 | Disposition: A | Discharge status: Home / Self Care | Payer: Medicare Other | Source: Ambulatory Visit | Attending: Interventional Cardiology | Admitting: Interventional Cardiology

## 2019-10-23 DIAGNOSIS — Z951 Presence of aortocoronary bypass graft: Secondary | ICD-10-CM

## 2019-10-23 NOTE — Progress Notes (Signed)
Daily Session Note  Patient Details  Name: Kathleen Terrell MRN: [id number] Date of Birth: [birth year] Referring Provider:     CARDIAC REHAB PHASE II ORIENTATION from 08/27/2019 in [facility]  Referring Provider  [facility]      Encounter Date: 10/23/2019  Check In: Session Check In - 10/23/19 0930      Check-In   Supervising physician immediately available to respond to emergencies  See telemetry face sheet for immediately available MD    Location  [facility]    Staff Present  Russella Dar, MS, EP, [facility], Exercise Physiologist;Amanda Ballard, Exercise Physiologist;Irfan Veal Wynetta Emery, RN, BSN    Virtual Visit  No    Medication changes reported      Yes    Comments  Patient saw a pharmacist yesterday who is managing her medication changes. She increased Chlorthalidone from 12.5 mg daily to 25 mg daily due to continued elevated blood pressure readings and patient having diffculty splitting the medication in half.    Fall or balance concerns reported     No    Tobacco Cessation  No Change    Warm-up and Cool-down  Performed as group-led instruction    Resistance Training Performed  Yes    VAD Patient?  No    PAD/SET Patient?  No      Pain Assessment   Currently in Pain?  No/denies    Pain Score  0-No pain    Multiple Pain Sites  No       Capillary Blood Glucose: No results found for this or any previous visit (from the past 24 hour(s)).    Social History   Tobacco Use  Smoking Status Former Smoker  . Packs/day: 1.00  . Years: 19.00  . Pack years: 19.00  . Types: Cigarettes  . Quit date: 03/09/1984  . Years since quitting: 35.6  Smokeless Tobacco Never Used    Goals Met:  Independence with exercise equipment Exercise tolerated well No report of cardiac concerns or symptoms Strength training completed today  Goals Unmet:  Not Applicable  Comments: Pt able to follow exercise prescription today without complaint.  Will continue to  monitor for progression. Check out 1030.   Dr. Kate Sable is Medical Director for [name] and [facility].

## 2019-10-25 ENCOUNTER — Encounter (HOSPITAL_COMMUNITY): Payer: Medicare Other

## 2019-10-28 ENCOUNTER — Other Ambulatory Visit: Payer: Self-pay

## 2019-10-28 ENCOUNTER — Encounter (HOSPITAL_COMMUNITY)
Admission: RE | Admit: 2019-10-28 | Discharge: 2019-10-28 | Disposition: A | Discharge status: Home / Self Care | Payer: Medicare Other | Source: Ambulatory Visit | Attending: Interventional Cardiology | Admitting: Interventional Cardiology

## 2019-10-28 DIAGNOSIS — Z951 Presence of aortocoronary bypass graft: Secondary | ICD-10-CM | POA: Diagnosis not present

## 2019-10-28 NOTE — Progress Notes (Signed)
Daily Session Note  Patient Details  Name: Kathleen Terrell MRN: [id number] Date of Birth: 12/01/1946 Referring Provider:     [facility] from 08/27/2019 in [place]  Referring Provider  [facility]hardt      Encounter Date: 10/28/2019  Check In:   Capillary Blood Glucose: No results found for this or any previous visit (from the past 24 hour(s)).    Social History   Tobacco Use  Smoking Status Former Smoker  . Packs/day: 1.00  . Years: 19.00  . Pack years: 19.00  . Types: Cigarettes  . Quit date: 03/09/1984  . Years since quitting: 35.6  Smokeless Tobacco Never Used    Goals Met:  Independence with exercise equipment Exercise tolerated well Personal goals reviewed No report of cardiac concerns or symptoms Strength training completed today  Goals Unmet:  Not Applicable  Comments: Check out at 10:30am.   Dr. Kate Sable is Medical Director for [facility] and [facility] Re[facility].

## 2019-10-30 ENCOUNTER — Encounter (HOSPITAL_COMMUNITY): Payer: Medicare Other

## 2019-10-31 NOTE — Progress Notes (Signed)
Cardiac Individual Treatment Plan  Patient Details  Name: Kathleen Terrell MRN: [id number] Date of Birth: [birth year] Referring Provider:     CARDIAC REHAB PHASE II ORIENTATION from 08/27/2019 in [place]  Referring Provider  Gerhardt      Initial Encounter Date:    CARDIAC REHAB PHASE II ORIENTATION from 08/27/2019 in [place]  Date  08/27/19      Visit Diagnosis: S/P CABG x 3  Patient's Home Medications on Admission:  Current Outpatient Medications:  .  aspirin EC 325 MG tablet, Take 1 tablet (325 mg total) by mouth daily., Disp: 90 tablet, Rfl: 3 .  atorvastatin (LIPITOR) 40 MG tablet, Take 1 tablet (40 mg total) by mouth daily., Disp: 90 tablet, Rfl: 3 .  chlorthalidone (HYGROTON) 25 MG tablet, Take 1 tablet (25 mg total) by mouth daily., Disp: 90 tablet, Rfl: 3 .  Cholecalciferol (VITAMIN D3) 2000 units TABS, Take 2,000 Units by mouth daily., Disp: , Rfl:  .  HYDROcodone-acetaminophen (NORCO) 5-325 MG tablet, Take 1 tablet by mouth 2 (two) times daily as needed for up to 20 doses for moderate pain. (Patient not taking: Reported on 09/06/2019), Disp: 20 tablet, Rfl: 0 .  hydrocortisone (PROCTOZONE-HC) 2.5 % rectal cream, Place 1 application rectally 4 (four) times daily. UP TO 10 DAYS FOR RECTAL PAIN/BLEEDING (Patient not taking: Reported on 08/27/2019), Disp: 30 g, Rfl: 0 .  levothyroxine (SYNTHROID) 75 MCG tablet, Take 75 mcg by mouth daily before breakfast., Disp: , Rfl:  .  lisinopril (ZESTRIL) 20 MG tablet, Take 1 tablet (20 mg total) by mouth daily., Disp: 90 tablet, Rfl: 3 .  metoprolol tartrate (LOPRESSOR) 50 MG tablet, Take 1 tablet (50 mg total) by mouth 2 (two) times daily., Disp: 180 tablet, Rfl: 3 .  Misc Natural Products (TART CHERRY ADVANCED PO), Take 1 capsule by mouth daily., Disp: , Rfl:  .  Multiple Vitamins-Minerals (MULTIVITAMIN PO), Take 1 tablet by mouth daily., Disp: , Rfl:  .  Probiotic Product (PROBIOTIC FORMULA  PO), Take 1 capsule by mouth daily.  , Disp: , Rfl:  .  TURMERIC CURCUMIN PO, Take 1 capsule by mouth daily., Disp: , Rfl:   Past Medical History: Past Medical History:  Diagnosis Date  . Abnormal stress test   . Acute serous otitis media    LEFT EAR  . Arthritis   . Atopic eczema    DERMATITIS  . Cataracts, bilateral   . Colon adenomas   . Diverticulitis 11/24/2011  . DM (diabetes mellitus) ([place])    patient denies, not on medications, diet controlled  . Headache   . Heme + stool 03/22/2017  . History of colonic polyps 03/22/2017  . HTN (hypertension)   . Hyperlipidemia   . Hypothyroidism   . Impacted cerumen of left ear   . Low back pain   . Melena   . PONV (postoperative nausea and vomiting)   . Spondylolisthesis of lumbar region 09/06/2016  . Syncope     Tobacco Use: Social History   Tobacco Use  Smoking Status Former Smoker  . Packs/day: 1.00  . Years: 19.00  . Pack years: 19.00  . Types: Cigarettes  . Quit date: 03/09/1984  . Years since quitting: 35.6  Smokeless Tobacco Never Used    Labs: Recent Chemical engineer    Labs for ITP Cardiac and Pulmonary Rehab Latest Ref Rng & Units 06/18/2019 06/18/2019 06/18/2019 06/18/2019 10/08/2019   Cholestrol 100 - 199 mg/dL - - - -  122   LDLCALC 0 - 99 mg/dL - - - - 58   HDL >39 mg/dL - - - - 48   Trlycerides 0 - 149 mg/dL - - - - 79   Hemoglobin A1c 4.8 - 5.6 % - - - - -   PHART 7.350 - 7.450 7.392 7.322(L) 7.320(L) - -   PCO2ART 32.0 - 48.0 mmHg 38.3 45.4 44.5 - -   HCO3 20.0 - 28.0 mmol/L 23.6 23.6 22.9 - -   TCO2 22 - 32 mmol/L '25 25 24 25 '$ -   ACIDBASEDEF 0.0 - 2.0 mmol/L 2.0 3.0(H) 3.0(H) - -   O2SAT % 98.0 97.0 97.0 - -      Capillary Blood Glucose: Lab Results  Component Value Date   GLUCAP 101 (H) 06/23/2019   GLUCAP 98 06/22/2019   GLUCAP 85 06/22/2019   GLUCAP 96 06/22/2019   GLUCAP 101 (H) 06/22/2019     Exercise Target Goals: Exercise Program Goal: Individual exercise prescription set using  results from initial 6 min walk test and THRR while considering  patient's activity barriers and safety.   Exercise Prescription Goal: Starting with aerobic activity 30 plus minutes a day, 3 days per week for initial exercise prescription. Provide home exercise prescription and guidelines that participant acknowledges understanding prior to discharge.  Activity Barriers & Risk Stratification: Activity Barriers & Cardiac Risk Stratification - 08/27/19 1404      Activity Barriers & Cardiac Risk Stratification   Activity Barriers  Back Problems;Shortness of Breath    Cardiac Risk Stratification  High       6 Minute Walk: 6 Minute Walk    Row Name 08/27/19 1403         6 Minute Walk   Phase  Initial     Distance  1100 feet     Walk Time  6 minutes     # of Rest Breaks  0     MPH  2.08     METS  2.6     RPE  11     Perceived Dyspnea   12     VO2 Peak  8.58     Symptoms  No     Resting HR  82 bpm     Resting BP  128/62     Resting Oxygen Saturation   99 %     Exercise Oxygen Saturation  during 6 min walk  91 %     Max Ex. HR  118 bpm     Max Ex. BP  142/68     2 Minute Post BP  132/70        Oxygen Initial Assessment:   Oxygen Re-Evaluation:   Oxygen Discharge (Final Oxygen Re-Evaluation):   Initial Exercise Prescription: Initial Exercise Prescription - 08/27/19 1400      Date of Initial Exercise RX and Referring Provider   Date  08/27/19    Referring Provider  Gerhardt    Expected Discharge Date  11/26/19      Treadmill   MPH  1.3    Grade  0    Minutes  17    METs  1.9      NuStep   Level  1    SPM  85    Minutes  22    METs  1.9      Prescription Details   Frequency (times per week)  3    Duration  Progress to 30 minutes of continuous aerobic without signs/symptoms of  physical distress      Intensity   THRR 40-80% of Max Heartrate  [phone]    Ratings of Perceived Exertion  11-15    Perceived Dyspnea  0-4      Progression   Progression   Continue to progress workloads to maintain intensity without signs/symptoms of physical distress.      Resistance Training   Training Prescription  Yes    Weight  1    Reps  10-15       Perform Capillary Blood Glucose checks as needed.  Exercise Prescription Changes:  Exercise Prescription Changes    Row Name 08/27/19 1400 09/12/19 1000 10/01/19 1000 10/15/19 1000 10/31/19 1300     Response to Exercise   Blood Pressure (Admit)  128/62  138/80  190/70  150/82  140/82   Blood Pressure (Exercise)  142/68  162/72  160/70  164/82  160/90   Blood Pressure (Exit)  132/70  130/80  150/70  128/80  138/78   Heart Rate (Admit)  72 bpm  60 bpm  57 bpm  56 bpm  57 bpm   Heart Rate (Exercise)  118 bpm  82 bpm  99 bpm  107 bpm  83 bpm   Heart Rate (Exit)  65 bpm  72 bpm  66 bpm  68 bpm  51 bpm   Oxygen Saturation (Admit)  99 %  -  -  -  -   Oxygen Saturation (Exercise)  91 %  -  -  -  -   Oxygen Saturation (Exit)  96 %  -  -  -  -   Rating of Perceived Exertion (Exercise)  '11  12  11  11  8   '$ Perceived Dyspnea (Exercise)  13  -  -  -  -   Symptoms  none  none  none  none  none   Comments  6MWT  First two weeks of exercise   increase in overall MET level   increase in speed on the TM  increase TM speed to 2.11mh   Duration  Progress to 30 minutes of  aerobic without signs/symptoms of physical distress  Continue with 30 min of aerobic exercise without signs/symptoms of physical distress.  Continue with 30 min of aerobic exercise without signs/symptoms of physical distress.  Continue with 30 min of aerobic exercise without signs/symptoms of physical distress.  Continue with 30 min of aerobic exercise without signs/symptoms of physical distress.   Intensity  THRR New [phone]  THRR unchanged  THRR unchanged  THRR unchanged  THRR New     Progression   Progression  -  Continue to progress workloads to maintain intensity without signs/symptoms of physical distress.  Continue to progress workloads to  maintain intensity without signs/symptoms of physical distress.  Continue to progress workloads to maintain intensity without signs/symptoms of physical distress.  Continue to progress workloads to maintain intensity without signs/symptoms of physical distress.   Average METs  -  2.07  2.36  2.64  2.83     Resistance Training   Training Prescription  -  Yes  Yes  Yes  Yes   Weight  -  '1  2  2  4   '$ Reps  -  10-15  10-15  10-15  10-15     Treadmill   MPH  -  1.5  2  2.2  2.6   Grade  -  0  0  0  0   Minutes  -  $'17  17  17  17   'Z$ METs  -  2.14  2.53  2.68  2.98     NuStep   Level  -  '1  2  2  4   '$ SPM  -  98  114  147  150   Minutes  -  '22  22  22  22   '$ METs  -  2  2.2  2.6  2.7     Home Exercise Plan   Plans to continue exercise at  Home (comment) walking  Home (comment)  Home (comment)  Home (comment)  Home (comment)   Frequency  Add 2 additional days to program exercise sessions.  Add 2 additional days to program exercise sessions.  Add 2 additional days to program exercise sessions.  Add 2 additional days to program exercise sessions.  Add 2 additional days to program exercise sessions.   Initial Home Exercises Provided  08/27/19  08/27/19  08/27/19  08/27/19  08/27/19      Exercise Comments:  Exercise Comments    Row Name 09/12/19 1015 09/16/19 1310 10/01/19 1018 10/07/19 1710 10/15/19 1011   Exercise Comments  Pt. is new to the program. She has tolerated the exercise with ease. She is commited to following her exercise prescription in order to help her reach her goals.  Pt. is still fairly new to the program. She has tolerated the exercise with ease. She is commited to following her exercise prescription in order to help her reach her goals.  Pt. continues to work hard in [place]. She is having some issues with high resting BP but otherwise is handling the exercise well. She is now walking at 2.0 MPH and on level 2 on the NS. We will continue to progress her as needed.  Mardelle has lost 4lbs  since starting the program. Her goal is 10-15lbs. She has shared that the education has been helpful. She has tolerated the exercise progressions well and is doing well on the equipment.  Pt. is now walking on the TM at 2.2 MPH with ease, we will increase her speed as she tolerates progression.   [place] Name 10/31/19 1516           Exercise Comments  Patient is doing well with her progressions every 2 weeks. She has lost 3.8lbs since starting the program. She enjoys coming to the program.          Exercise Goals and Review:  Exercise Goals    Row Name 08/27/19 1406 10/31/19 1307 10/31/19 1507         Exercise Goals   Increase Physical Activity  Yes  Yes  Yes     Intervention  Provide advice, education, support and counseling about physical activity/exercise needs.;Develop an individualized exercise prescription for aerobic and resistive training based on initial evaluation findings, risk stratification, comorbidities and participant's personal goals.  -  Develop an individualized exercise prescription for aerobic and resistive training based on initial evaluation findings, risk stratification, comorbidities and participant's personal goals.     Expected Outcomes  Short Term: Attend rehab on a regular basis to increase amount of physical activity.;Long Term: Add in home exercise to make exercise part of routine and to increase amount of physical activity.;Long Term: Exercising regularly at least 3-5 days a week.  Long Term: Exercising regularly at least 3-5 days a week.  Short Term: Attend rehab on a regular basis to increase amount of physical activity.;Long Term: Exercising regularly at least 3-5  days a week.     Increase Strength and Stamina  Yes  Yes  Yes     Intervention  Provide advice, education, support and counseling about physical activity/exercise needs.;Develop an individualized exercise prescription for aerobic and resistive training based on initial evaluation findings, risk  stratification, comorbidities and participant's personal goals.  Provide advice, education, support and counseling about physical activity/exercise needs.  -     Expected Outcomes  Short Term: Increase workloads from initial exercise prescription for resistance, speed, and METs.;Short Term: Perform resistance training exercises routinely during rehab and add in resistance training at home;Long Term: Improve cardiorespiratory fitness, muscular endurance and strength as measured by increased METs and functional capacity (6MWT)  -  Short Term: Increase workloads from initial exercise prescription for resistance, speed, and METs.;Long Term: Improve cardiorespiratory fitness, muscular endurance and strength as measured by increased METs and functional capacity (6MWT)     Able to understand and use rate of perceived exertion (RPE) scale  Yes  Yes  Yes     Intervention  Provide education and explanation on how to use RPE scale  Provide education and explanation on how to use RPE scale  Provide education and explanation on how to use RPE scale     Expected Outcomes  Short Term: Able to use RPE daily in rehab to express subjective intensity level;Long Term:  Able to use RPE to guide intensity level when exercising independently  -  Short Term: Able to use RPE daily in rehab to express subjective intensity level;Long Term:  Able to use RPE to guide intensity level when exercising independently     Able to understand and use Dyspnea scale  Yes  -  -     Intervention  Provide education and explanation on how to use Dyspnea scale  -  -     Expected Outcomes  Short Term: Able to use Dyspnea scale daily in rehab to express subjective sense of shortness of breath during exertion;Long Term: Able to use Dyspnea scale to guide intensity level when exercising independently  -  -     Knowledge and understanding of Target Heart Rate Range (THRR)  Yes  Yes  Yes     Intervention  Provide education and explanation of THRR including  how the numbers were predicted and where they are located for reference  -  -     Expected Outcomes  Short Term: Able to state/look up THRR;Long Term: Able to use THRR to govern intensity when exercising independently;Short Term: Able to use daily as guideline for intensity in rehab  Long Term: Able to use THRR to govern intensity when exercising independently;Short Term: Able to use daily as guideline for intensity in rehab  Short Term: Able to use daily as guideline for intensity in rehab;Long Term: Able to use THRR to govern intensity when exercising independently     Able to check pulse independently  Yes  Yes  Yes     Intervention  Provide education and demonstration on how to check pulse in carotid and radial arteries.;Review the importance of being able to check your own pulse for safety during independent exercise  Provide education and demonstration on how to check pulse in carotid and radial arteries.;Review the importance of being able to check your own pulse for safety during independent exercise  Provide education and demonstration on how to check pulse in carotid and radial arteries.;Review the importance of being able to check your own pulse for safety during independent exercise  Expected Outcomes  Short Term: Able to explain why pulse checking is important during independent exercise;Long Term: Able to check pulse independently and accurately  Short Term: Able to explain why pulse checking is important during independent exercise;Long Term: Able to check pulse independently and accurately  Short Term: Able to explain why pulse checking is important during independent exercise;Long Term: Able to check pulse independently and accurately     Understanding of Exercise Prescription  Yes  Yes  Yes     Intervention  Provide education, explanation, and written materials on patient's individual exercise prescription  Provide education, explanation, and written materials on patient's individual  exercise prescription  Provide education, explanation, and written materials on patient's individual exercise prescription     Expected Outcomes  Short Term: Able to explain program exercise prescription;Long Term: Able to explain home exercise prescription to exercise independently  Short Term: Able to explain program exercise prescription;Long Term: Able to explain home exercise prescription to exercise independently  Long Term: Able to explain home exercise prescription to exercise independently;Short Term: Able to explain program exercise prescription        Exercise Goals Re-Evaluation : Exercise Goals Re-Evaluation    Row Name 09/12/19 1014 09/16/19 1309 10/07/19 1708 10/31/19 1511       Exercise Goal Re-Evaluation   Exercise Goals Review  Increase Physical Activity;Increase Strength and Stamina;Able to understand and use rate of perceived exertion (RPE) scale;Able to understand and use Dyspnea scale;Knowledge and understanding of Target Heart Rate Range (THRR);Able to check pulse independently;Understanding of Exercise Prescription  Increase Physical Activity;Increase Strength and Stamina;Able to understand and use rate of perceived exertion (RPE) scale;Able to understand and use Dyspnea scale;Knowledge and understanding of Target Heart Rate Range (THRR);Able to check pulse independently;Understanding of Exercise Prescription  Increase Physical Activity;Increase Strength and Stamina;Knowledge and understanding of Target Heart Rate Range (THRR);Able to check pulse independently;Understanding of Exercise Prescription  Increase Physical Activity;Increase Strength and Stamina;Able to check pulse independently;Understanding of Exercise Prescription    Comments  Pt. is new to the program. She has completed 4 sessions so far. She is tolerating the exercise well.  Pt. has now completed 6 sessions. She is doing extremely well. We will begin increasing her workloads as tolerated.  Patient has improved by  attending the program. Her doctors are still working on getting her BP down.  Patient is now walking 2.78mh on the TM with ease. We will continue to progress every 2 weeks as she able to tolerate the increases.    Expected Outcomes  Short: lose 5 - 10 lbs Long: develop an exercise routine to better take care of herself.  Short: lose 5 - 10 lbs Long: develop an exercise routine to better take care of herself.  She is wanting to lose 10-15lbs. She also wants to gain knowledge to better take care of herself.  To reach her goals of weight lose of 10-15lbs and to gain knowledge to better take care of herself.        Discharge Exercise Prescription (Final Exercise Prescription Changes): Exercise Prescription Changes - 10/31/19 1300      Response to Exercise   Blood Pressure (Admit)  140/82    Blood Pressure (Exercise)  160/90    Blood Pressure (Exit)  138/78    Heart Rate (Admit)  57 bpm    Heart Rate (Exercise)  83 bpm    Heart Rate (Exit)  51 bpm    Rating of Perceived Exertion (Exercise)  8  Symptoms  none    Comments  increase TM speed to 2.63mh    Duration  Continue with 30 min of aerobic exercise without signs/symptoms of physical distress.    Intensity  THRR New      Progression   Progression  Continue to progress workloads to maintain intensity without signs/symptoms of physical distress.    Average METs  2.83      Resistance Training   Training Prescription  Yes    Weight  4    Reps  10-15      Treadmill   MPH  2.6    Grade  0    Minutes  17    METs  2.98      NuStep   Level  4    SPM  150    Minutes  22    METs  2.7      Home Exercise Plan   Plans to continue exercise at  Home (comment)    Frequency  Add 2 additional days to program exercise sessions.    Initial Home Exercises Provided  08/27/19       Nutrition:  Target Goals: Understanding of nutrition guidelines, daily intake of sodium '1500mg'$ , cholesterol '200mg'$ , calories 30% from fat and 7% or less from  saturated fats, daily to have 5 or more servings of fruits and vegetables.  Biometrics: Pre Biometrics - 08/27/19 1408      Pre Biometrics   Height  '5\' 3"'$  (1.6 m)    Weight  79.7 kg    Waist Circumference  37 inches    Hip Circumference  41 inches    Waist to Hip Ratio  0.9 %    BMI (Calculated)  31.13    Triceps Skinfold  12 mm    % Body Fat  37.5 %    Grip Strength  2 kg   kg   Flexibility  0 in   screw in back between L4-L5   Single Leg Stand  21.85 seconds        Nutrition Therapy Plan and Nutrition Goals: Nutrition Therapy & Goals - 10/31/19 1553      Personal Nutrition Goals   Comments  We have resumed our RD classes with limited participates in order to social distance. Patient has not attended a class yet. She continues to say she is eating more fruits, chicken, and fish and continues to cut back on red meat. Will continue to monitor for progress.      Intervention Plan   Intervention  Nutrition handout(s) given to patient.    Expected Outcomes  Short Term Goal: Understand basic principles of dietary content, such as calories, fat, sodium, cholesterol and nutrients.       Nutrition Assessments: Nutrition Assessments - 08/27/19 1432      MEDFICTS Scores   Pre Score  54       Nutrition Goals Re-Evaluation:   Nutrition Goals Discharge (Final Nutrition Goals Re-Evaluation):   Psychosocial: Target Goals: Acknowledge presence or absence of significant depression and/or stress, maximize coping skills, provide positive support system. Participant is able to verbalize types and ability to use techniques and skills needed for reducing stress and depression.  Initial Review & Psychosocial Screening: Initial Psych Review & Screening - 08/27/19 1430      Initial Review   Current issues with  None Identified      Family Dynamics   Good Support System?  Yes      Barriers   Psychosocial barriers to  participate in program  There are no identifiable barriers or  psychosocial needs.      Screening Interventions   Interventions  Encouraged to exercise    Expected Outcomes  Short Term goal: Identification and review with participant of any Quality of Life or Depression concerns found by scoring the questionnaire.;Long Term goal: The participant improves quality of Life and PHQ9 Scores as seen by post scores and/or verbalization of changes       Quality of Life Scores: Quality of Life - 08/27/19 1411      Quality of Life   Select  Quality of Life      Quality of Life Scores   Health/Function Pre  25.04 %    Socioeconomic Pre  23.31 %    Psych/Spiritual Pre  27 %    Family Pre  27.6 %    GLOBAL Pre  25.41 %      Scores of 19 and below usually indicate a poorer quality of life in these areas.  A difference of  2-3 points is a clinically meaningful difference.  A difference of 2-3 points in the total score of the Quality of Life Index has been associated with significant improvement in overall quality of life, self-image, physical symptoms, and general health in studies assessing change in quality of life.  PHQ-9: Recent Review Flowsheet Data    Depression screen [facility] 2/9 08/27/2019   Decreased Interest 0   Down, Depressed, Hopeless 0   PHQ - 2 Score 0   Altered sleeping 0   Tired, decreased energy 0   Change in appetite 0   Feeling bad or failure about yourself  0   Trouble concentrating 0   Moving slowly or fidgety/restless 0   PHQ-9 Score 0     Interpretation of Total Score  Total Score Depression Severity:  1-4 = Minimal depression, 5-9 = Mild depression, 10-14 = Moderate depression, 15-19 = Moderately severe depression, 20-27 = Severe depression   Psychosocial Evaluation and Intervention: Psychosocial Evaluation - 08/27/19 1430      Psychosocial Evaluation & Interventions   Interventions  Encouraged to exercise with the program and follow exercise prescription    Continue Psychosocial Services   No Follow up required        Psychosocial Re-Evaluation: Psychosocial Re-Evaluation    [place] Name 09/16/19 1404 10/07/19 1239 10/31/19 1559         Psychosocial Re-Evaluation   Current issues with  None Identified  -  None Identified     Comments  Patient's initial QOL score was 25.41 and her PHQ-9 score was 0 with no psychosocial issues identified. Will continue to monitor.  Patient's initial QOL score was 25.41 and her PHQ-9 score was 0 with no psychosocial issues identified. Will continue to monitor.  Patient's initial QOL score was 25.41 and her PHQ-9 score was 0 with no psychosocial issues identified. Will continue to monitor.     Expected Outcomes  Patient will have no psychosocial issues identified at discharge.  Patient will have no psychosocial issues identified at discharge.  Patient will have no psychosocial issues identified at discharge.     Interventions  Stress management education;Encouraged to attend Cardiac Rehabilitation for the exercise;Relaxation education  Stress management education;Encouraged to attend Cardiac Rehabilitation for the exercise;Relaxation education  Stress management education;Encouraged to attend Cardiac Rehabilitation for the exercise;Relaxation education     Continue Psychosocial Services   No Follow up required  No Follow up required  No Follow up required  Psychosocial Discharge (Final Psychosocial Re-Evaluation): Psychosocial Re-Evaluation - 10/31/19 1559      Psychosocial Re-Evaluation   Current issues with  None Identified    Comments  Patient's initial QOL score was 25.41 and her PHQ-9 score was 0 with no psychosocial issues identified. Will continue to monitor.    Expected Outcomes  Patient will have no psychosocial issues identified at discharge.    Interventions  Stress management education;Encouraged to attend Cardiac Rehabilitation for the exercise;Relaxation education    Continue Psychosocial Services   No Follow up required       Vocational  Rehabilitation: Provide vocational rehab assistance to qualifying candidates.   Vocational Rehab Evaluation & Intervention: Vocational Rehab - 08/27/19 1432      Initial Vocational Rehab Evaluation & Intervention   Assessment shows need for Vocational Rehabilitation  No       Education: Education Goals: Education classes will be provided on a weekly basis, covering required topics. Participant will state understanding/return demonstration of topics presented.  Learning Barriers/Preferences: Learning Barriers/Preferences - 08/27/19 1432      Learning Barriers/Preferences   Learning Barriers  None    Learning Preferences  Video;Written Material;Pictoral;Group Instruction;Skilled Demonstration       Education Topics: Hypertension, Hypertension Reduction -Define heart disease and high blood pressure. Discus how high blood pressure affects the body and ways to reduce high blood pressure.   CARDIAC REHAB PHASE II EXERCISE from 10/23/2019 in [place]  Date  10/02/19  Educator  D. Coad  Instruction Review Code  2- Demonstrated Understanding      Exercise and Your Heart -Discuss why it is important to exercise, the FITT principles of exercise, normal and abnormal responses to exercise, and how to exercise safely.   Angina -Discuss definition of angina, causes of angina, treatment of angina, and how to decrease risk of having angina.   CARDIAC REHAB PHASE II EXERCISE from 10/23/2019 in [place]  Date  10/16/19  Educator  DC  Instruction Review Code  2- Demonstrated Understanding      Cardiac Medications -Review what the following cardiac medications are used for, how they affect the body, and side effects that may occur when taking the medications.  Medications include Aspirin, Beta blockers, calcium channel blockers, ACE Inhibitors, angiotensin receptor blockers, diuretics, digoxin, and antihyperlipidemics.   CARDIAC REHAB PHASE  II EXERCISE from 10/23/2019 in [place]  Date  10/23/19  Educator  Etheleen Mayhew  Instruction Review Code  2- Demonstrated Understanding      Congestive Heart Failure -Discuss the definition of CHF, how to live with CHF, the signs and symptoms of CHF, and how keep track of weight and sodium intake.   Heart Disease and Intimacy -Discus the effect sexual activity has on the heart, how changes occur during intimacy as we age, and safety during sexual activity.   Smoking Cessation / COPD -Discuss different methods to quit smoking, the health benefits of quitting smoking, and the definition of COPD.   Nutrition I: Fats -Discuss the types of cholesterol, what cholesterol does to the heart, and how cholesterol levels can be controlled.   Nutrition II: Labels -Discuss the different components of food labels and how to read food label   Heart Parts/Heart Disease and PAD -Discuss the anatomy of the heart, the pathway of blood circulation through the heart, and these are affected by heart disease.   CARDIAC REHAB PHASE II EXERCISE from 10/23/2019 in [place]  Date  09/04/19  Educator  DJ  Instruction Review Code  2- Demonstrated Understanding      Stress I: Signs and Symptoms -Discuss the causes of stress, how stress may lead to anxiety and depression, and ways to limit stress.   CARDIAC REHAB PHASE II EXERCISE from 10/23/2019 in [place]  Date  09/11/19  Educator  Etheleen Mayhew  Instruction Review Code  2- Demonstrated Understanding      Stress II: Relaxation -Discuss different types of relaxation techniques to limit stress.   CARDIAC REHAB PHASE II EXERCISE from 10/23/2019 in [place]  Date  09/18/19  Educator  Etheleen Mayhew  Instruction Review Code  2- Demonstrated Understanding      Warning Signs of Stroke / TIA -Discuss definition of a stroke, what the signs and symptoms are of  a stroke, and how to identify when someone is having stroke.   Knowledge Questionnaire Score: Knowledge Questionnaire Score - 08/27/19 1432      Knowledge Questionnaire Score   Pre Score  22/24       Core Components/Risk Factors/Patient Goals at Admission: Personal Goals and Risk Factors at Admission - 08/27/19 1433      Core Components/Risk Factors/Patient Goals on Admission    Weight Management  Yes    Intervention  Weight Management: Develop a combined nutrition and exercise program designed to reach desired caloric intake, while maintaining appropriate intake of nutrient and fiber, sodium and fats, and appropriate energy expenditure required for the weight goal.    Admit Weight  175 lb 12.8 oz (79.7 kg)    Goal Weight: Short Term  170 lb 12.8 oz (77.5 kg)    Goal Weight: Long Term  165 lb 12.8 oz (75.2 kg)    Expected Outcomes  Short Term: Continue to assess and modify interventions until short term weight is achieved;Long Term: Adherence to nutrition and physical activity/exercise program aimed toward attainment of established weight goal    Personal Goal Other  Yes    Personal Goal  Lose 10-15lbs, Gain knowledge to better take care of my health.    Intervention  Attend CR 3 x week and supplement with 2 x week at home exercise.    Expected Outcomes  Reach expected goals.       Core Components/Risk Factors/Patient Goals Review:  Goals and Risk Factor Review    Row Name 09/16/19 1359 10/07/19 1235 10/31/19 1555         Core Components/Risk Factors/Patient Goals Review   Personal Goals Review  Weight Management/Obesity;Other Lose 10-15 lbs. Gain knowledge to better take care of herself.  Weight Management/Obesity;Other Lose 10-15 lbs; increase knowledge to better take care of herself.  Weight Management/Obesity;Other Lose 10-15 lbs; increase knowledge to better take care of herself.     Review  Patient has completed 6 sessions maintaining her weight since her orientation visit.  She is doing well in the program. She is making progress toward meeting her goals. Will continue to monitor.  Patient has completed 14 sessions losing 2 lbs since last 30 day review. She continues to do well in the program. Her blood pressure continues to be hypertensive but 2 medication adjustments. Her cardiologist continues to monitor this. She has a f/u visit in the next few weeks. She is very concerned about her blood pressue and we continue to reasurre her we will get her controlled. Her cardiologist wants her >130/80. She reports feeling a lot better and stronger since she started the program.  She says she is learning more about her condition and reports the education has been very informative. Will continue to monitor for progress.  Patient has completed 23 sessions losing 1 lb since last 30 day review. She continues to do very well in the program with continued progression. Her blood pressure is trending down with average initial readings of 140/80 after several medication adjustments. She is pleased with the progress and hopes to get to the goal of >130/80 soon. She reports feeling stronger with more energy and is able to do more around the house. She feels she is working toward meeting her personal goals. Will continue to monitor for progress.     Expected Outcomes  Patient will continue attend sessions and complete the program meeting her personal goals.  Patient will continue attend sessions and complete the program meeting her personal goals.  Patient will continue attend sessions and complete the program meeting her personal goals.        Core Components/Risk Factors/Patient Goals at Discharge (Final Review):  Goals and Risk Factor Review - 10/31/19 1555      Core Components/Risk Factors/Patient Goals Review   Personal Goals Review  Weight Management/Obesity;Other   Lose 10-15 lbs; increase knowledge to better take care of herself.   Review  Patient has completed 23 sessions losing 1 lb  since last 30 day review. She continues to do very well in the program with continued progression. Her blood pressure is trending down with average initial readings of 140/80 after several medication adjustments. She is pleased with the progress and hopes to get to the goal of >130/80 soon. She reports feeling stronger with more energy and is able to do more around the house. She feels she is working toward meeting her personal goals. Will continue to monitor for progress.    Expected Outcomes  Patient will continue attend sessions and complete the program meeting her personal goals.       ITP Comments: ITP Comments    Row Name 08/27/19 1345 10/04/19 0956 10/23/19 0958       ITP Comments  Patient has done well since her CABX3 in 05/2019. She is eager to get started.  Patient saw a pharmacist yesterday. Her b/p goal for the patient is >130/80. She added Chlorthalidone 12.5 mg daily and told her to reduce her fluid intake to 56 oz daily and to watch her salt intake. She is also to keep a log of her b/p readings at home. She is to f/u in 2 weeks.  Patient saw a pharmacist yesterday who is managing her medication changes. She increased Chlorthalidone from 12.5 mg daily to 25 mg daily due to continued elevated blood pressure readings and patient having diffculty splitting the medication in half. Will continue to monitor.        ITP REVIEW Comments: Pt is making expected progress toward Cardiac Rehab goals after completing 23 sessions. Recommend continued exercise, life style modification, education, and increased stamina and strength.

## 2019-11-01 ENCOUNTER — Other Ambulatory Visit: Payer: Self-pay

## 2019-11-01 ENCOUNTER — Encounter (HOSPITAL_COMMUNITY)
Admission: RE | Admit: 2019-11-01 | Discharge: 2019-11-01 | Disposition: A | Discharge status: Home / Self Care | Payer: Medicare Other | Source: Ambulatory Visit | Attending: Interventional Cardiology | Admitting: Interventional Cardiology

## 2019-11-01 DIAGNOSIS — Z951 Presence of aortocoronary bypass graft: Secondary | ICD-10-CM

## 2019-11-01 NOTE — Progress Notes (Signed)
Daily Session Note  Patient Details  Name: Kathleen Terrell MRN: [id number] Date of Birth: 09/18/1946 Referring Provider:     CARDIAC REHAB PHASE II ORIENTATION from 08/27/2019 in [facility]  Referring Provider  Gerhardt      Encounter Date: 11/01/2019  Check In:   Capillary Blood Glucose: No results found for this or any previous visit (from the past 24 hour(s)).    Social History   Tobacco Use  Smoking Status Former Smoker  . Packs/day: 1.00  . Years: 19.00  . Pack years: 19.00  . Types: Cigarettes  . Quit date: 03/09/1984  . Years since quitting: 35.6  Smokeless Tobacco Never Used    Goals Met:  Independence with exercise equipment Exercise tolerated well Personal goals reviewed No report of cardiac concerns or symptoms Strength training completed today  Goals Unmet:  Not Applicable  Comments: Check out at 12pm.   Dr. Suresh Koneswaran is Medical Director for [facility]. 

## 2019-11-04 ENCOUNTER — Encounter (HOSPITAL_COMMUNITY)
Admission: RE | Admit: 2019-11-04 | Discharge: 2019-11-04 | Disposition: A | Discharge status: Home / Self Care | Payer: Medicare Other | Source: Ambulatory Visit | Attending: Interventional Cardiology | Admitting: Interventional Cardiology

## 2019-11-04 ENCOUNTER — Other Ambulatory Visit: Payer: Self-pay

## 2019-11-04 DIAGNOSIS — Z951 Presence of aortocoronary bypass graft: Secondary | ICD-10-CM | POA: Diagnosis not present

## 2019-11-04 NOTE — Progress Notes (Signed)
Daily Session Note  Patient Details  Name: Kathleen Terrell MRN: [id number] Date of Birth: 02-Jan-1946 Referring Provider:     CARDIAC REHAB PHASE II ORIENTATION from 08/27/2019 in [facility]  Referring Provider  Gerhardt      Encounter Date: 11/04/2019  Check In: Session Check In - 11/04/19 0930      Check-In   Supervising physician immediately available to respond to emergencies  See telemetry face sheet for immediately available MD    Location  [facility]    Staff Present  Russella Dar, MS, EP, [facility], Exercise Physiologist;Other    Virtual Visit  No    Medication changes reported      No    Fall or balance concerns reported     No    Tobacco Cessation  No Change    Warm-up and Cool-down  Performed as group-led instruction    Resistance Training Performed  Yes    VAD Patient?  No    PAD/SET Patient?  No      Pain Assessment   Currently in Pain?  No/denies    Pain Score  0-No pain    Multiple Pain Sites  No       Capillary Blood Glucose: No results found for this or any previous visit (from the past 24 hour(s)).    Social History   Tobacco Use  Smoking Status Former Smoker  . Packs/day: 1.00  . Years: 19.00  . Pack years: 19.00  . Types: Cigarettes  . Quit date: 03/09/1984  . Years since quitting: 35.6  Smokeless Tobacco Never Used    Goals Met:  Independence with exercise equipment Exercise tolerated well No report of cardiac concerns or symptoms Strength training completed today  Goals Unmet:  Not Applicable  Comments: check out: 1030   Dr. Kate Sable is Medical Director for [facility] and Pulmonary Rehab.

## 2019-11-06 ENCOUNTER — Encounter (HOSPITAL_COMMUNITY)
Admission: RE | Admit: 2019-11-06 | Discharge: 2019-11-06 | Disposition: A | Discharge status: Home / Self Care | Payer: Medicare Other | Source: Ambulatory Visit | Attending: Interventional Cardiology | Admitting: Interventional Cardiology

## 2019-11-06 ENCOUNTER — Other Ambulatory Visit: Payer: Self-pay

## 2019-11-06 DIAGNOSIS — Z951 Presence of aortocoronary bypass graft: Secondary | ICD-10-CM

## 2019-11-06 NOTE — Progress Notes (Signed)
Daily Session Note  Patient Details  Name: Kathleen Terrell MRN: [id number] Date of Birth: July 10, 1946 Referring Provider:     CARDIAC REHAB PHASE II ORIENTATION from 08/27/2019 in [facility]  Referring Provider  Gerhardt      Encounter Date: 11/06/2019  Check In: Session Check In - 11/06/19 0930      Check-In   Location  [facility]    Staff Present  Diane Angelina Pih, MS, EP, [facility], Exercise Physiologist;Debra Wynetta Emery, RN, BSN;Other    Virtual Visit  No    Medication changes reported      No    Fall or balance concerns reported     No    Tobacco Cessation  No Change    Warm-up and Cool-down  Performed as group-led instruction    Resistance Training Performed  Yes    VAD Patient?  No    PAD/SET Patient?  No      Pain Assessment   Currently in Pain?  No/denies    Pain Score  0-No pain    Multiple Pain Sites  No       Capillary Blood Glucose: No results found for this or any previous visit (from the past 24 hour(s)).    Social History   Tobacco Use  Smoking Status Former Smoker  . Packs/day: 1.00  . Years: 19.00  . Pack years: 19.00  . Types: Cigarettes  . Quit date: 03/09/1984  . Years since quitting: 35.6  Smokeless Tobacco Never Used    Goals Met:  Independence with exercise equipment Exercise tolerated well No report of cardiac concerns or symptoms Strength training completed today  Goals Unmet:  Not Applicable  Comments: checkout at 30   Dr. Kate Sable is Medical Director for [facility] and Pulmonary Rehab.

## 2019-11-07 ENCOUNTER — Other Ambulatory Visit: Payer: Self-pay

## 2019-11-07 ENCOUNTER — Other Ambulatory Visit: Payer: Medicare Other | Admitting: *Deleted

## 2019-11-07 DIAGNOSIS — I10 Essential (primary) hypertension: Secondary | ICD-10-CM

## 2019-11-08 ENCOUNTER — Encounter (HOSPITAL_COMMUNITY)
Admission: RE | Admit: 2019-11-08 | Discharge: 2019-11-08 | Disposition: A | Discharge status: Home / Self Care | Payer: Medicare Other | Source: Ambulatory Visit | Attending: Interventional Cardiology | Admitting: Interventional Cardiology

## 2019-11-08 DIAGNOSIS — Z951 Presence of aortocoronary bypass graft: Secondary | ICD-10-CM

## 2019-11-08 LAB — BASIC METABOLIC PANEL
BUN/Creatinine Ratio: 11 — ABNORMAL LOW (ref 12–28)
BUN: 10 mg/dL (ref 8–27)
CO2: 27 mmol/L (ref 20–29)
Calcium: 8.9 mg/dL (ref 8.7–10.3)
Chloride: 100 mmol/L (ref 96–106)
Creatinine, Ser: 0.87 mg/dL (ref 0.57–1.00)
GFR calc Af Amer: 76 mL/min/{1.73_m2} (ref >59)
GFR calc non Af Amer: 66 mL/min/{1.73_m2} (ref >59)
Glucose: 89 mg/dL (ref 65–99)
Potassium: 3.6 mmol/L (ref 3.5–5.2)
Sodium: 141 mmol/L (ref 134–144)

## 2019-11-08 LAB — HEPATIC FUNCTION PANEL
ALT: 21 IU/L (ref 0–32)
AST: 23 IU/L (ref 0–40)
Albumin: 4 g/dL (ref 3.7–4.7)
Alkaline Phosphatase: 113 IU/L (ref 39–117)
Bilirubin Total: 0.2 mg/dL (ref 0.0–1.2)
Bilirubin, Direct: 0.06 mg/dL (ref 0.00–0.40)
Total Protein: 7 g/dL (ref 6.0–8.5)

## 2019-11-08 NOTE — Progress Notes (Signed)
Daily Session Note  Patient Details  Name: TALLULA GRINDLE MRN: [id number] Date of Birth: 1946/01/02 Referring Provider:     CARDIAC REHAB PHASE II ORIENTATION from 08/27/2019 in [facility]  Referring Provider  Gerhardt      Encounter Date: 11/08/2019  Check In:   Capillary Blood Glucose: Results for orders placed or performed in visit on 11/07/19 (from the past 24 hour(s))  Hepatic function panel     Status: None   Collection Time: 11/07/19  1:42 PM  Result Value Ref Range   Total Protein 7.0 6.0 - 8.5 g/dL   Albumin 4.0 3.7 - 4.7 g/dL   Bilirubin Total 0.2 0.0 - 1.2 mg/dL   Bilirubin, Direct 0.06 0.00 - 0.40 mg/dL   Alkaline Phosphatase 113 39 - 117 IU/L   AST 23 0 - 40 IU/L   ALT 21 0 - 32 IU/L   Narrative   Performed at:  [address], [place], [place]  [id number] Lab Director: Rush Farmer MD, Phone:  [phone]  Basic Metabolic Panel (BMET)     Status: Abnormal   Collection Time: 11/07/19  1:42 PM  Result Value Ref Range   Glucose 89 65 - 99 mg/dL   BUN 10 8 - 27 mg/dL   Creatinine, Ser 0.87 0.57 - 1.00 mg/dL   GFR calc non Af Amer 66 >59 mL/min/1.73   GFR calc Af Amer 76 >59 mL/min/1.73   BUN/Creatinine Ratio 11 (L) 12 - 28   Sodium 141 134 - 144 mmol/L   Potassium 3.6 3.5 - 5.2 mmol/L   Chloride 100 96 - 106 mmol/L   CO2 27 20 - 29 mmol/L   Calcium 8.9 8.7 - 10.3 mg/dL   Narrative   Performed at:  [address], [place], [place]  [id number] Lab Director: Rush Farmer MD, Phone:  [phone]      Social History   Tobacco Use  Smoking Status Former Smoker  . Packs/day: 1.00  . Years: 19.00  . Pack years: 19.00  . Types: Cigarettes  . Quit date: 03/09/1984  . Years since quitting: 35.6  Smokeless Tobacco Never Used    Goals Met:  Independence with exercise equipment Exercise tolerated well Personal goals reviewed No report of cardiac concerns or symptoms Strength training  completed today  Goals Unmet:  Not Applicable  Comments: Check out at 10:30am.   Dr. Kate Sable is Medical Director for [facility] and Pulmonary Rehab.

## 2019-11-09 NOTE — Progress Notes (Signed)
Patient ID: [name]                 DOB: 12/20/1946                      MRN: [id number]     HPI: Kathleen Terrell is a 73 y.o. female Dr. Irish Lack referred by Lyda Jester to HTN clinic. PMH is significant for CAD s/p CABG 06/18/2019, HDL, HTN and hypothyroidism. At last follow up visit on 10/22/2019, chlorthalidone was increased from 12.5 mg daily to 25 mg daily to simplify administration of chlorthalidone.  Pt presents for follow up appt with HTN clinic. She states she has noticed her BP has improved. Pt has not experienced s/sx of hypotension. She is tolerating her recent increase of chlorthalidone.   Patient specific reminders: Royann Shivers (72 month old nephew)  Current HTN meds: lisinopril20mg  daily, metoprolol 50mg  twice a day, chlorthalidone 25 mg daily  Previously tried: lisinopril/HCTZ BP goal: <130/80  Family History: Heat attack, HTN, thyroid disease, CAD, DM in mother, CKD, heart attack, stroke in brother, HTN in daughter  Social History: quit smoking 35 years ago  Diet: 1 cup of coffee in AM, limits soda (maybe once a week), tea (maybe once a week)  Exercise: cardiac rehab (just started), walking outside 3x per week    Home BP readings (takes 1:30 PM after lunch (takes meds in between 7-9 AM)) Resting blood pressures at cardiac rehab since last HTN appt (10/27): 114-150/60-82 mmHg (pulse 48-66; mostly in the 60s)  Wt Readings from Last 3 Encounters:  08/27/19 175 lb 11.3 oz (79.7 kg)  08/13/19 172 lb 12.8 oz (78.4 kg)  07/22/19 178 lb (80.7 kg)   BP Readings from Last 3 Encounters:  10/22/19 (!) 142/74  10/03/19 (!) 172/80  09/06/19 (!) 142/78   Pulse Readings from Last 3 Encounters:  10/22/19 (!) 54  10/03/19 61  09/06/19 (!) 57    Renal function: CrCl cannot be calculated (Unknown ideal weight.).  Past Medical History:  Diagnosis Date  . Abnormal stress test   . Acute serous otitis media    LEFT EAR  . Arthritis   . Atopic eczema    DERMATITIS   . Cataracts, bilateral   . Colon adenomas   . Diverticulitis 11/24/2011  . DM (diabetes mellitus) ([place])    patient denies, not on medications, diet controlled  . Headache   . Heme + stool 03/22/2017  . History of colonic polyps 03/22/2017  . HTN (hypertension)   . Hyperlipidemia   . Hypothyroidism   . Impacted cerumen of left ear   . Low back pain   . Melena   . PONV (postoperative nausea and vomiting)   . Spondylolisthesis of lumbar region 09/06/2016  . Syncope     Current Outpatient Medications on File Prior to Visit  Medication Sig Dispense Refill  . aspirin EC 325 MG tablet Take 1 tablet (325 mg total) by mouth daily. 90 tablet 3  . atorvastatin (LIPITOR) 40 MG tablet Take 1 tablet (40 mg total) by mouth daily. 90 tablet 3  . chlorthalidone (HYGROTON) 25 MG tablet Take 1 tablet (25 mg total) by mouth daily. 90 tablet 3  . Cholecalciferol (VITAMIN D3) 2000 units TABS Take 2,000 Units by mouth daily.    Marland Kitchen HYDROcodone-acetaminophen (NORCO) 5-325 MG tablet Take 1 tablet by mouth 2 (two) times daily as needed for up to 20 doses for moderate pain. (Patient not taking: Reported on 09/06/2019) 20  tablet 0  . hydrocortisone (PROCTOZONE-HC) 2.5 % rectal cream Place 1 application rectally 4 (four) times daily. UP TO 10 DAYS FOR RECTAL PAIN/BLEEDING (Patient not taking: Reported on 08/27/2019) 30 g 0  . levothyroxine (SYNTHROID) 75 MCG tablet Take 75 mcg by mouth daily before breakfast.    . lisinopril (ZESTRIL) 20 MG tablet Take 1 tablet (20 mg total) by mouth daily. 90 tablet 3  . metoprolol tartrate (LOPRESSOR) 50 MG tablet Take 1 tablet (50 mg total) by mouth 2 (two) times daily. 180 tablet 3  . Misc Natural Products (TART CHERRY ADVANCED PO) Take 1 capsule by mouth daily.    . Multiple Vitamins-Minerals (MULTIVITAMIN PO) Take 1 tablet by mouth daily.    . Probiotic Product (PROBIOTIC FORMULA PO) Take 1 capsule by mouth daily.      . TURMERIC CURCUMIN PO Take 1 capsule by mouth daily.      No current facility-administered medications on file prior to visit.     Allergies  Allergen Reactions  . Methionine Other (See Comments)    UNSPECIFIED REACTION  Pt not sure  . Darvocet [Propoxyphene N-Acetaminophen] Nausea Only and Other (See Comments)  . Latex Rash  . Ultram [Tramadol Hcl] Nausea And Vomiting     Assessment/Plan:  1. Hypertension - goal BP <130/80 mmHg; therefore blood pressure is at goal! Congratulated patient on her success with lifestyle modifications, exercise, and medication adherence! Plan to continue chlorthalidone 25 mg daily, lisinopril 20 mg daily and metoprolol 50 mg BID. Most recent BMP shows stable Scr/K and pulse appears to be stable (usually in the 60s). Advised pt to check home BP readings daily for the next 2 weeks. Plan to follow up with patient on 11/26/2019 via telephone call to re-assess blood pressure medications. If BP readings are stable at follow up clinic then plan to discharge pt from HTN clinic and allow her to follow up prn regarding current HTN medication regimen. If BP readings are elevated then plan to increase lisinopril from 20 mg daily to 40 mg daily and re-check labs. Pt verbalized understanding.   Thank you for involving pharmacy to assist in providing Kathleen Terrell's care. Future thoughts: add amlodipine or switch metoprolol to carvedilol.   Drexel Iha, PharmD PGY2 Ambulatory Care Pharmacy Resident

## 2019-11-11 ENCOUNTER — Other Ambulatory Visit: Payer: Self-pay

## 2019-11-11 ENCOUNTER — Encounter (HOSPITAL_COMMUNITY)
Admission: RE | Admit: 2019-11-11 | Discharge: 2019-11-11 | Disposition: A | Discharge status: Home / Self Care | Payer: Medicare Other | Source: Ambulatory Visit | Attending: Interventional Cardiology | Admitting: Interventional Cardiology

## 2019-11-11 DIAGNOSIS — Z951 Presence of aortocoronary bypass graft: Secondary | ICD-10-CM

## 2019-11-11 NOTE — Progress Notes (Signed)
Daily Session Note  Patient Details  Name: Kathleen Terrell MRN: [id number] Date of Birth: [birth year] Referring Provider:     [facility] from 08/27/2019 in [facility]  Referring Provider  Gerhardt      Encounter Date: 11/11/2019  Check In:   Capillary Blood Glucose: No results found for this or any previous visit (from the past 24 hour(s)).    Social History   Tobacco Use  Smoking Status Former Smoker  . Packs/day: 1.00  . Years: 19.00  . Pack years: 19.00  . Types: Cigarettes  . Quit date: 03/09/1984  . Years since quitting: 35.6  Smokeless Tobacco Never Used    Goals Met:  Independence with exercise equipment Exercise tolerated well Personal goals reviewed No report of cardiac concerns or symptoms Strength training completed today  Goals Unmet:  Not Applicable  Comments: Check out at 10:30am.   Dr. Kate Sable is Medical Director for [facility] and Pulmonary Rehab.

## 2019-11-12 ENCOUNTER — Other Ambulatory Visit: Payer: Self-pay

## 2019-11-12 ENCOUNTER — Ambulatory Visit (INDEPENDENT_AMBULATORY_CARE_PROVIDER_SITE_OTHER): Payer: Medicare Other | Admitting: Pharmacist

## 2019-11-12 VITALS — BP 122/76 | HR 60

## 2019-11-12 DIAGNOSIS — I10 Essential (primary) hypertension: Secondary | ICD-10-CM

## 2019-11-12 NOTE — Patient Instructions (Addendum)
It was a pleasure seeing you in clinic today Ms. Travaglini!  Today the plan is...  1. Check blood pressure daily for 2 weeks. If blood pressure remains at goal we will continue lisinopril 20 mg daily, metoprolol 50 mg twice daily, and chlorthalidone 25 mg daily. If elevated, we may consider increasing lisinopril to 40 mg daily and bringing you back in for labs.   2. Continuing your hard work on exercise and diet !!!  Please call the PharmD clinic at [phone] if you have any questions that you would like to speak with a pharmacist about [facility], [place], [place]).

## 2019-11-13 ENCOUNTER — Encounter (HOSPITAL_COMMUNITY)
Admission: RE | Admit: 2019-11-13 | Discharge: 2019-11-13 | Disposition: A | Discharge status: Home / Self Care | Payer: Medicare Other | Source: Ambulatory Visit | Attending: Interventional Cardiology | Admitting: Interventional Cardiology

## 2019-11-13 DIAGNOSIS — Z951 Presence of aortocoronary bypass graft: Secondary | ICD-10-CM | POA: Diagnosis not present

## 2019-11-13 NOTE — Progress Notes (Signed)
Daily Session Note  Patient Details  Name: Kathleen Terrell MRN: [id number] Date of Birth: 01/12/1946 Referring Provider:     CARDIAC REHAB PHASE II ORIENTATION from 08/27/2019 in [place]  Referring Provider  Gerhardt      Encounter Date: 11/13/2019  Check In: Session Check In - 11/13/19 0943      Check-In   Supervising physician immediately available to respond to emergencies  See telemetry face sheet for immediately available MD    Location  [facility]    Staff Present  Russella Dar, MS, EP, [facility], Exercise Physiologist;Debra Wynetta Emery, RN, BSN;Other    Virtual Visit  No    Medication changes reported      No    Fall or balance concerns reported     No    Tobacco Cessation  No Change    Warm-up and Cool-down  Performed as group-led instruction    Resistance Training Performed  Yes    VAD Patient?  No    PAD/SET Patient?  No      Pain Assessment   Currently in Pain?  No/denies    Pain Score  0-No pain    Multiple Pain Sites  No       Capillary Blood Glucose: No results found for this or any previous visit (from the past 24 hour(s)).    Social History   Tobacco Use  Smoking Status Former Smoker  . Packs/day: 1.00  . Years: 19.00  . Pack years: 19.00  . Types: Cigarettes  . Quit date: 03/09/1984  . Years since quitting: 35.7  Smokeless Tobacco Never Used    Goals Met:  Independence with exercise equipment Exercise tolerated well No report of cardiac concerns or symptoms Strength training completed today  Goals Unmet:  Not Applicable  Comments: checkout is at 50   Dr. Kate Sable is Medical Director for [place] and Pulmonary Rehab.

## 2019-11-15 ENCOUNTER — Other Ambulatory Visit: Payer: Self-pay

## 2019-11-15 ENCOUNTER — Encounter (HOSPITAL_COMMUNITY)
Admission: RE | Admit: 2019-11-15 | Discharge: 2019-11-15 | Disposition: A | Discharge status: Home / Self Care | Payer: Medicare Other | Source: Ambulatory Visit | Attending: Interventional Cardiology | Admitting: Interventional Cardiology

## 2019-11-15 DIAGNOSIS — Z951 Presence of aortocoronary bypass graft: Secondary | ICD-10-CM | POA: Diagnosis not present

## 2019-11-15 NOTE — Progress Notes (Signed)
Daily Session Note  Patient Details  Name: Kathleen Terrell MRN: [id number] Date of Birth: 01/03/1946 Referring Provider:     CARDIAC REHAB PHASE II ORIENTATION from 08/27/2019 in [place] [facility]  Referring Provider  Gerhardt      Encounter Date: 11/15/2019  Check In:   Capillary Blood Glucose: No results found for this or any previous visit (from the past 24 hour(s)).    Social History   Tobacco Use  Smoking Status Former Smoker  . Packs/day: 1.00  . Years: 19.00  . Pack years: 19.00  . Types: Cigarettes  . Quit date: 03/09/1984  . Years since quitting: 35.7  Smokeless Tobacco Never Used    Goals Met:  Independence with exercise equipment Exercise tolerated well Personal goals reviewed No report of cardiac concerns or symptoms Strength training completed today  Goals Unmet:  Not Applicable  Comments: Check out at 10:30am.   Dr. Suresh Koneswaran is Medical Director for [place] [facility]. 

## 2019-11-18 ENCOUNTER — Other Ambulatory Visit: Payer: Self-pay

## 2019-11-18 ENCOUNTER — Encounter (HOSPITAL_COMMUNITY)
Admission: RE | Admit: 2019-11-18 | Discharge: 2019-11-18 | Disposition: A | Discharge status: Home / Self Care | Payer: Medicare Other | Source: Ambulatory Visit | Attending: Interventional Cardiology | Admitting: Interventional Cardiology

## 2019-11-18 DIAGNOSIS — Z951 Presence of aortocoronary bypass graft: Secondary | ICD-10-CM | POA: Diagnosis not present

## 2019-11-18 NOTE — Progress Notes (Signed)
Daily Session Note  Patient Details  Name: Kathleen Terrell MRN: [id number] Date of Birth: [birth year] Referring Provider:     [place] from 08/27/2019 in [place]  Referring Provider  Gerhardt      Encounter Date: 11/18/2019  Check In:   Capillary Blood Glucose: No results found for this or any previous visit (from the past 24 hour(s)).    Social History   Tobacco Use  Smoking Status Former Smoker  . Packs/day: 1.00  . Years: 19.00  . Pack years: 19.00  . Types: Cigarettes  . Quit date: 03/09/1984  . Years since quitting: 35.7  Smokeless Tobacco Never Used    Goals Met:  Independence with exercise equipment Exercise tolerated well Personal goals reviewed No report of cardiac concerns or symptoms Strength training completed today  Goals Unmet:  Not Applicable  Comments: Check out at 10:30am.   Dr. Kate Sable is Medical Director for [place] and Pulmonary Rehab.

## 2019-11-20 ENCOUNTER — Encounter (HOSPITAL_COMMUNITY)
Admission: RE | Admit: 2019-11-20 | Discharge: 2019-11-20 | Disposition: A | Discharge status: Home / Self Care | Payer: Medicare Other | Source: Ambulatory Visit | Attending: Interventional Cardiology | Admitting: Interventional Cardiology

## 2019-11-20 ENCOUNTER — Other Ambulatory Visit: Payer: Self-pay

## 2019-11-20 DIAGNOSIS — Z951 Presence of aortocoronary bypass graft: Secondary | ICD-10-CM

## 2019-11-20 NOTE — Progress Notes (Signed)
Daily Session Note  Patient Details  Name: Kathleen Terrell MRN: [id number] Date of Birth: 04/11/1946 Referring Provider:     CARDIAC REHAB PHASE II ORIENTATION from 08/27/2019 in [place] [facility]  Referring Provider  Gerhardt      Encounter Date: 11/20/2019  Check In: Session Check In - 11/20/19 0935      Check-In   Supervising physician immediately available to respond to emergencies  See telemetry face sheet for immediately available MD    Location  [facility]    Staff Present  Diane Coad, MS, EP, [facility], Exercise Physiologist;Debra Johnson, RN, BSN;Other    Virtual Visit  No    Medication changes reported      No    Fall or balance concerns reported     No    Tobacco Cessation  No Change    Warm-up and Cool-down  Performed as group-led instruction    Resistance Training Performed  Yes    VAD Patient?  No    PAD/SET Patient?  No      Pain Assessment   Currently in Pain?  No/denies    Pain Score  0-No pain    Multiple Pain Sites  No       Capillary Blood Glucose: No results found for this or any previous visit (from the past 24 hour(s)).    Social History   Tobacco Use  Smoking Status Former Smoker  . Packs/day: 1.00  . Years: 19.00  . Pack years: 19.00  . Types: Cigarettes  . Quit date: 03/09/1984  . Years since quitting: 35.7  Smokeless Tobacco Never Used    Goals Met:  Independence with exercise equipment Exercise tolerated well No report of cardiac concerns or symptoms Strength training completed today  Goals Unmet:  Not Applicable  Comments: checkout time is 1030   Dr. Suresh Koneswaran is Medical Director for [place] [facility]. 

## 2019-11-22 ENCOUNTER — Encounter (HOSPITAL_COMMUNITY): Payer: Medicare Other

## 2019-11-25 ENCOUNTER — Encounter (HOSPITAL_COMMUNITY)
Admission: RE | Admit: 2019-11-25 | Discharge: 2019-11-25 | Disposition: A | Discharge status: Home / Self Care | Payer: Medicare Other | Source: Ambulatory Visit | Attending: Interventional Cardiology | Admitting: Interventional Cardiology

## 2019-11-25 ENCOUNTER — Other Ambulatory Visit: Payer: Self-pay

## 2019-11-25 VITALS — Ht 63.0 in | Wt 172.2 lb

## 2019-11-25 DIAGNOSIS — Z951 Presence of aortocoronary bypass graft: Secondary | ICD-10-CM | POA: Diagnosis not present

## 2019-11-25 NOTE — Progress Notes (Signed)
Daily Session Note  Patient Details  Name: Kathleen Terrell MRN: [id number] Date of Birth: 28-Aug-1946 Referring Provider:     [place] from 08/27/2019 in [place]  Referring Provider  Gerhardt      Encounter Date: 11/25/2019  Check In:   Capillary Blood Glucose: No results found for this or any previous visit (from the past 24 hour(s)).    Social History   Tobacco Use  Smoking Status Former Smoker  . Packs/day: 1.00  . Years: 19.00  . Pack years: 19.00  . Types: Cigarettes  . Quit date: 03/09/1984  . Years since quitting: 35.7  Smokeless Tobacco Never Used    Goals Met:  Independence with exercise equipment Exercise tolerated well Personal goals reviewed No report of cardiac concerns or symptoms Strength training completed today  Goals Unmet:  Not Applicable  Comments: Check out at 10:30am.   Dr. Kate Sable is Medical Director for [place] and Pulmonary Rehab.

## 2019-11-27 ENCOUNTER — Other Ambulatory Visit: Payer: Self-pay

## 2019-11-27 ENCOUNTER — Encounter (HOSPITAL_COMMUNITY)
Admission: RE | Admit: 2019-11-27 | Discharge: 2019-11-27 | Disposition: A | Discharge status: Home / Self Care | Payer: Medicare Other | Source: Ambulatory Visit | Attending: Cardiothoracic Surgery | Admitting: Cardiothoracic Surgery

## 2019-11-27 DIAGNOSIS — Z951 Presence of aortocoronary bypass graft: Secondary | ICD-10-CM | POA: Insufficient documentation

## 2019-11-27 NOTE — Progress Notes (Signed)
Daily Session Note  Patient Details  Name: Kathleen Terrell MRN: [id number] Date of Birth: 31-Dec-1945 Referring Provider:     CARDIAC REHAB PHASE II ORIENTATION from 08/27/2019 in [place]  Referring Provider  Gerhardt      Encounter Date: 11/27/2019  Check In: Session Check In - 11/27/19 0930      Check-In   Supervising physician immediately available to respond to emergencies  See telemetry face sheet for immediately available MD    Location  [facility]    Staff Present  Russella Dar, MS, EP, [facility], Exercise Physiologist;Latysha Thackston Wynetta Emery, RN, BSN    Virtual Visit  No    Medication changes reported      No    Fall or balance concerns reported     No    Tobacco Cessation  No Change    Warm-up and Cool-down  Performed as group-led instruction    Resistance Training Performed  Yes    VAD Patient?  No    PAD/SET Patient?  No      Pain Assessment   Currently in Pain?  No/denies    Pain Score  0-No pain    Multiple Pain Sites  No       Capillary Blood Glucose: No results found for this or any previous visit (from the past 24 hour(s)).    Social History   Tobacco Use  Smoking Status Former Smoker  . Packs/day: 1.00  . Years: 19.00  . Pack years: 19.00  . Types: Cigarettes  . Quit date: 03/09/1984  . Years since quitting: 35.7  Smokeless Tobacco Never Used    Goals Met:  Independence with exercise equipment Exercise tolerated well No report of cardiac concerns or symptoms Strength training completed today  Goals Unmet:  Not Applicable  Comments: Check out 1030.   Dr. Kate Sable is Medical Director for [facility] and [facility] Re[facility].

## 2019-11-29 ENCOUNTER — Telehealth: Payer: Self-pay | Admitting: Pharmacist

## 2019-11-29 ENCOUNTER — Encounter (HOSPITAL_COMMUNITY)
Admission: RE | Admit: 2019-11-29 | Discharge: 2019-11-29 | Disposition: A | Discharge status: Home / Self Care | Payer: Medicare Other | Source: Ambulatory Visit | Attending: Interventional Cardiology | Admitting: Interventional Cardiology

## 2019-11-29 ENCOUNTER — Other Ambulatory Visit: Payer: Self-pay

## 2019-11-29 DIAGNOSIS — Z951 Presence of aortocoronary bypass graft: Secondary | ICD-10-CM

## 2019-11-29 NOTE — Progress Notes (Signed)
Discharge Progress Report  Patient Details  Name: Kathleen Terrell MRN: [id number] Date of Birth: 29-Oct-1946 Referring Provider:     [facility] from 08/27/2019 in [place]  Referring Provider  [place]       Number of Visits: 3  Reason for Discharge:  Patient reached a stable level of exercise. Patient independent in their exercise. Patient has met program and personal goals.  Smoking History:  Social History   Tobacco Use  Smoking Status Former Smoker  . Packs/day: 1.00  . Years: 19.00  . Pack years: 19.00  . Types: Cigarettes  . Quit date: 03/09/1984  . Years since quitting: 35.7  Smokeless Tobacco Never Used    Diagnosis:  S/P CABG x 3  ADL UCSD:   Initial Exercise Prescription: Initial Exercise Prescription - 08/27/19 1400      Date of Initial Exercise RX and Referring Provider   Date  08/27/19    Referring Provider  [facility]    Expected Discharge Date  11/26/19      Treadmill   MPH  1.3    Grade  0    Minutes  17    METs  1.9      NuStep   Level  1    SPM  85    Minutes  22    METs  1.9      Prescription Details   Frequency (times per week)  3    Duration  Progress to 30 minutes of continuous aerobic without signs/symptoms of physical distress      Intensity   THRR 40-80% of Max Heartrate  [phone]    Ratings of Perceived Exertion  11-15    Perceived Dyspnea  0-4      Progression   Progression  Continue to progress workloads to maintain intensity without signs/symptoms of physical distress.      Resistance Training   Training Prescription  Yes    Weight  1    Reps  10-15       Discharge Exercise Prescription (Final Exercise Prescription Changes): Exercise Prescription Changes - 10/31/19 1300      Response to Exercise   Blood Pressure (Admit)  140/82    Blood Pressure (Exercise)  160/90    Blood Pressure (Exit)  138/78    Heart Rate (Admit)  57 bpm    Heart Rate (Exercise)  83 bpm     Heart Rate (Exit)  51 bpm    Rating of Perceived Exertion (Exercise)  8    Symptoms  none    Comments  increase TM speed to 2.52mh    Duration  Continue with 30 min of aerobic exercise without signs/symptoms of physical distress.    Intensity  THRR New      Progression   Progression  Continue to progress workloads to maintain intensity without signs/symptoms of physical distress.    Average METs  2.83      Resistance Training   Training Prescription  Yes    Weight  4    Reps  10-15      Treadmill   MPH  2.6    Grade  0    Minutes  17    METs  2.98      NuStep   Level  4    SPM  150    Minutes  22    METs  2.7      Home Exercise Plan   Plans to continue exercise  at  Home (comment)    Frequency  Add 2 additional days to program exercise sessions.    Initial Home Exercises Provided  08/27/19       Functional Capacity: 6 Minute Walk    Row Name 08/27/19 1403 11/25/19 1007       6 Minute Walk   Phase  Initial  Discharge    Distance  1100 feet  1400 feet    Distance % Change  -  27 %    Distance Feet Change  -  300 ft    Walk Time  6 minutes  6 minutes    # of Rest Breaks  0  0    MPH  2.08  2.65    METS  2.6  3.03    RPE  11  11    Perceived Dyspnea   12  11    VO2 Peak  8.58  10.56    Symptoms  No  No    Resting HR  82 bpm  70 bpm    Resting BP  128/62  140/70    Resting Oxygen Saturation   99 %  100 %    Exercise Oxygen Saturation  during 6 min walk  91 %  97 %    Max Ex. HR  118 bpm  86 bpm    Max Ex. BP  142/68  120/72    2 Minute Post BP  132/70  114/62       Psychological, QOL, Others - Outcomes: PHQ 2/9: Depression screen [facility] 2/9 11/28/2019 08/27/2019  Decreased Interest 0 0  Down, Depressed, Hopeless 0 0  PHQ - 2 Score 0 0  Altered sleeping 0 0  Tired, decreased energy 0 0  Change in appetite 0 0  Feeling bad or failure about yourself  0 0  Trouble concentrating 0 0  Moving slowly or fidgety/restless 0 0  Suicidal thoughts 0 -  PHQ-9  Score 0 0    Quality of Life: Quality of Life - 11/28/19 1437      Quality of Life Scores   Health/Function Pre  25.04 %    Health/Function Post  27.82 %    Health/Function % Change  11.1 %    Socioeconomic Pre  23.31 %    Socioeconomic Post  25.92 %    Socioeconomic % Change   11.2 %    Psych/Spiritual Pre  27 %    Psych/Spiritual Post  27.5 %    Psych/Spiritual % Change  1.85 %    Family Pre  27.6 %    Family Post  27 %    Family % Change  -2.17 %    GLOBAL Pre  25.41 %    GLOBAL Post  27.27 %    GLOBAL % Change  7.32 %       Personal Goals: Goals established at orientation with interventions provided to work toward goal. Personal Goals and Risk Factors at Admission - 08/27/19 1433      Core Components/Risk Factors/Patient Goals on Admission    Weight Management  Yes    Intervention  Weight Management: Develop a combined nutrition and exercise program designed to reach desired caloric intake, while maintaining appropriate intake of nutrient and fiber, sodium and fats, and appropriate energy expenditure required for the weight goal.    Admit Weight  175 lb 12.8 oz (79.7 kg)    Goal Weight: Short Term  170 lb 12.8 oz (77.5 kg)    Goal  Weight: Long Term  165 lb 12.8 oz (75.2 kg)    Expected Outcomes  Short Term: Continue to assess and modify interventions until short term weight is achieved;Long Term: Adherence to nutrition and physical activity/exercise program aimed toward attainment of established weight goal    Personal Goal Other  Yes    Personal Goal  Lose 10-15lbs, Gain knowledge to better take care of my health.    Intervention  Attend CR 3 x week and supplement with 2 x week at home exercise.    Expected Outcomes  Reach expected goals.        Personal Goals Discharge: Goals and Risk Factor Review    Row Name 09/16/19 1359 10/07/19 1235 10/31/19 1555 11/29/19 1432       Core Components/Risk Factors/Patient Goals Review   Personal Goals Review  Weight  Management/Obesity;Other Lose 10-15 lbs. Gain knowledge to better take care of herself.  Weight Management/Obesity;Other Lose 10-15 lbs; increase knowledge to better take care of herself.  Weight Management/Obesity;Other Lose 10-15 lbs; increase knowledge to better take care of herself.  Weight Management/Obesity;Other Lose 10 lbs; Gain strength to better take care of myself.    Review  Patient has completed 6 sessions maintaining her weight since her orientation visit. She is doing well in the program. She is making progress toward meeting her goals. Will continue to monitor.  Patient has completed 14 sessions losing 2 lbs since last 30 day review. She continues to do well in the program. Her blood pressure continues to be hypertensive but 2 medication adjustments. Her cardiologist continues to monitor this. She has a f/u visit in the next few weeks. She is very concerned about her blood pressue and we continue to reasurre her we will get her controlled. Her cardiologist wants her >130/80. She reports feeling a lot better and stronger since she started the program. She says she is learning more about her condition and reports the education has been very informative. Will continue to monitor for progress.  Patient has completed 23 sessions losing 1 lb since last 30 day review. She continues to do very well in the program with continued progression. Her blood pressure is trending down with average initial readings of 140/80 after several medication adjustments. She is pleased with the progress and hopes to get to the goal of >130/80 soon. She reports feeling stronger with more energy and is able to do more around the house. She feels she is working toward meeting her personal goals. Will continue to monitor for progress.  Patient completed the program with 35 sessions losing 3.4 lbs and losing 1 inch in both her hip and waist measurements. Her exit measurements improved in grip strength and her exit walk test  improved by 27.1%. She did very well in the program with her blood pressure controlled at discharge. She says the program gave her hope to continue to come and she feels so much better now than when she started. She says she feels stronger and has more energy. She did not meet her goal of weight loss but is encourage and motivated to contiunue to strive to eat healthier. Her medficts score improved by 100%. She is happy she lost the inches. She says she has gained the knowledge of how to better take care of herself and feel she has the tools now to maintain a healthy lifestyle. She met both program and personal goals except 10 lb weight loss. She plans to continue exercising by walking on a treadmill  at home and outside in her neighborhood weather permitting. CR will f/u in one month.    Expected Outcomes  Patient will continue attend sessions and complete the program meeting her personal goals.  Patient will continue attend sessions and complete the program meeting her personal goals.  Patient will continue attend sessions and complete the program meeting her personal goals.  Patient will continue to exercise at home on her treadmill and by walking and continue to meet her personal goals.       Exercise Goals and Review: Exercise Goals    Row Name 08/27/19 1406 10/31/19 1307 10/31/19 1507         Exercise Goals   Increase Physical Activity  Yes  Yes  Yes     Intervention  Provide advice, education, support and counseling about physical activity/exercise needs.;Develop an individualized exercise prescription for aerobic and resistive training based on initial evaluation findings, risk stratification, comorbidities and participant's personal goals.  -  Develop an individualized exercise prescription for aerobic and resistive training based on initial evaluation findings, risk stratification, comorbidities and participant's personal goals.     Expected Outcomes  Short Term: Attend rehab on a regular  basis to increase amount of physical activity.;Long Term: Add in home exercise to make exercise part of routine and to increase amount of physical activity.;Long Term: Exercising regularly at least 3-5 days a week.  Long Term: Exercising regularly at least 3-5 days a week.  Short Term: Attend rehab on a regular basis to increase amount of physical activity.;Long Term: Exercising regularly at least 3-5 days a week.     Increase Strength and Stamina  Yes  Yes  Yes     Intervention  Provide advice, education, support and counseling about physical activity/exercise needs.;Develop an individualized exercise prescription for aerobic and resistive training based on initial evaluation findings, risk stratification, comorbidities and participant's personal goals.  Provide advice, education, support and counseling about physical activity/exercise needs.  -     Expected Outcomes  Short Term: Increase workloads from initial exercise prescription for resistance, speed, and METs.;Short Term: Perform resistance training exercises routinely during rehab and add in resistance training at home;Long Term: Improve cardiorespiratory fitness, muscular endurance and strength as measured by increased METs and functional capacity (6MWT)  -  Short Term: Increase workloads from initial exercise prescription for resistance, speed, and METs.;Long Term: Improve cardiorespiratory fitness, muscular endurance and strength as measured by increased METs and functional capacity (6MWT)     Able to understand and use rate of perceived exertion (RPE) scale  Yes  Yes  Yes     Intervention  Provide education and explanation on how to use RPE scale  Provide education and explanation on how to use RPE scale  Provide education and explanation on how to use RPE scale     Expected Outcomes  Short Term: Able to use RPE daily in rehab to express subjective intensity level;Long Term:  Able to use RPE to guide intensity level when exercising independently  -   Short Term: Able to use RPE daily in rehab to express subjective intensity level;Long Term:  Able to use RPE to guide intensity level when exercising independently     Able to understand and use Dyspnea scale  Yes  -  -     Intervention  Provide education and explanation on how to use Dyspnea scale  -  -     Expected Outcomes  Short Term: Able to use Dyspnea scale daily in rehab  to express subjective sense of shortness of breath during exertion;Long Term: Able to use Dyspnea scale to guide intensity level when exercising independently  -  -     Knowledge and understanding of Target Heart Rate Range (THRR)  Yes  Yes  Yes     Intervention  Provide education and explanation of THRR including how the numbers were predicted and where they are located for reference  -  -     Expected Outcomes  Short Term: Able to state/look up THRR;Long Term: Able to use THRR to govern intensity when exercising independently;Short Term: Able to use daily as guideline for intensity in rehab  Long Term: Able to use THRR to govern intensity when exercising independently;Short Term: Able to use daily as guideline for intensity in rehab  Short Term: Able to use daily as guideline for intensity in rehab;Long Term: Able to use THRR to govern intensity when exercising independently     Able to check pulse independently  Yes  Yes  Yes     Intervention  Provide education and demonstration on how to check pulse in carotid and radial arteries.;Review the importance of being able to check your own pulse for safety during independent exercise  Provide education and demonstration on how to check pulse in carotid and radial arteries.;Review the importance of being able to check your own pulse for safety during independent exercise  Provide education and demonstration on how to check pulse in carotid and radial arteries.;Review the importance of being able to check your own pulse for safety during independent exercise     Expected Outcomes  Short  Term: Able to explain why pulse checking is important during independent exercise;Long Term: Able to check pulse independently and accurately  Short Term: Able to explain why pulse checking is important during independent exercise;Long Term: Able to check pulse independently and accurately  Short Term: Able to explain why pulse checking is important during independent exercise;Long Term: Able to check pulse independently and accurately     Understanding of Exercise Prescription  Yes  Yes  Yes     Intervention  Provide education, explanation, and written materials on patient's individual exercise prescription  Provide education, explanation, and written materials on patient's individual exercise prescription  Provide education, explanation, and written materials on patient's individual exercise prescription     Expected Outcomes  Short Term: Able to explain program exercise prescription;Long Term: Able to explain home exercise prescription to exercise independently  Short Term: Able to explain program exercise prescription;Long Term: Able to explain home exercise prescription to exercise independently  Long Term: Able to explain home exercise prescription to exercise independently;Short Term: Able to explain program exercise prescription        Exercise Goals Re-Evaluation: Exercise Goals Re-Evaluation    Row Name 09/12/19 1014 09/16/19 1309 10/07/19 1708 10/31/19 1511       Exercise Goal Re-Evaluation   Exercise Goals Review  Increase Physical Activity;Increase Strength and Stamina;Able to understand and use rate of perceived exertion (RPE) scale;Able to understand and use Dyspnea scale;Knowledge and understanding of Target Heart Rate Range (THRR);Able to check pulse independently;Understanding of Exercise Prescription  Increase Physical Activity;Increase Strength and Stamina;Able to understand and use rate of perceived exertion (RPE) scale;Able to understand and use Dyspnea scale;Knowledge and  understanding of Target Heart Rate Range (THRR);Able to check pulse independently;Understanding of Exercise Prescription  Increase Physical Activity;Increase Strength and Stamina;Knowledge and understanding of Target Heart Rate Range (THRR);Able to check pulse independently;Understanding of Exercise Prescription  Increase Physical Activity;Increase Strength and Stamina;Able to check pulse independently;Understanding of Exercise Prescription    Comments  Pt. is new to the program. She has completed 4 sessions so far. She is tolerating the exercise well.  Pt. has now completed 6 sessions. She is doing extremely well. We will begin increasing her workloads as tolerated.  Patient has improved by attending the program. Her doctors are still working on getting her BP down.  Patient is now walking 2.31mh on the TM with ease. We will continue to progress every 2 weeks as she able to tolerate the increases.    Expected Outcomes  Short: lose 5 - 10 lbs Long: develop an exercise routine to better take care of herself.  Short: lose 5 - 10 lbs Long: develop an exercise routine to better take care of herself.  She is wanting to lose 10-15lbs. She also wants to gain knowledge to better take care of herself.  To reach her goals of weight lose of 10-15lbs and to gain knowledge to better take care of herself.       Nutrition & Weight - Outcomes: Pre Biometrics - 08/27/19 1408      Pre Biometrics   Height  '5\' 3"'$  (1.6 m)    Weight  79.7 kg    Waist Circumference  37 inches    Hip Circumference  41 inches    Waist to Hip Ratio  0.9 %    BMI (Calculated)  31.13    Triceps Skinfold  12 mm    % Body Fat  37.5 %    Grip Strength  2 kg   kg   Flexibility  0 in   screw in back between L4-L5   Single Leg Stand  21.85 seconds      Post Biometrics - 11/25/19 1011       Post  Biometrics   Height  '5\' 3"'$  (1.6 m)    Weight  78.1 kg    Waist Circumference  36 inches    Hip Circumference  40 inches    Waist to Hip Ratio   0.9 %    BMI (Calculated)  30.51    Triceps Skinfold  15 mm    % Body Fat  38 %    Grip Strength  17.86 kg    Flexibility  0 in   Omit- screw in back L4 and L5   Single Leg Stand  15.42 seconds       Nutrition: Nutrition Therapy & Goals - 10/31/19 1553      Personal Nutrition Goals   Comments  We have resumed our RD classes with limited participates in order to social distance. Patient has not attended a class yet. She continues to say she is eating more fruits, chicken, and fish and continues to cut back on red meat. Will continue to monitor for progress.      Intervention Plan   Intervention  Nutrition handout(s) given to patient.    Expected Outcomes  Short Term Goal: Understand basic principles of dietary content, such as calories, fat, sodium, cholesterol and nutrients.       Nutrition Discharge: Nutrition Assessments - 11/28/19 1436      MEDFICTS Scores   Pre Score  54    Post Score  0    Score Difference  -54       Education Questionnaire Score: Knowledge Questionnaire Score - 11/28/19 1436      Knowledge Questionnaire Score   Pre Score  22/24  Post Score  21/24      Patient graduated from [facility] today on 11/29/19 after completing 35 sessions. She achieved LTG of 30 minutes of aerobic exercise at Max Met level of 3.03. All patients vitals are WNL. Discharge instruction has been reviewed in detail and patient stated an understanding of material given. Patient plans to exercise at home on her Treadmill and by walking outside in her neighborhood. Cardiac Rehab staff will make f/u calls at 1 month, 6 months, and 1 year. Patient had no complaints of any abnormal S/S or pain on their exit visit.   Goals reviewed with patient; copy given to patient.

## 2019-11-29 NOTE — Telephone Encounter (Signed)
Called pt on 11/29/2019 at 3:22PM.  Patient confirmed her BP remains controlled at home (today her BP was 122/70 and yesterday it was 108/61). Plan to continue chlorthalidone 25 mg daily, lisinopril 20 mg daily, and metoprolol tartrate 50 mg daily. Plan for patient to follow up with pharmacy clinic as needed. Patient verbalized understanding and expressed appreciation for the entire pharmacy team for helping manage her blood pressure.

## 2019-11-29 NOTE — Progress Notes (Signed)
Daily Session Note  Patient Details  Name: Kathleen Terrell MRN: [id number] Date of Birth: 1946-06-18 Referring Provider:     [facility] from 08/27/2019 in [place]  Referring Provider  [place]      Encounter Date: 11/29/2019  Check In:   Capillary Blood Glucose: No results found for this or any previous visit (from the past 24 hour(s)).    Social History   Tobacco Use  Smoking Status Former Smoker  . Packs/day: 1.00  . Years: 19.00  . Pack years: 19.00  . Types: Cigarettes  . Quit date: 03/09/1984  . Years since quitting: 35.7  Smokeless Tobacco Never Used    Goals Met:  Independence with exercise equipment Exercise tolerated well Personal goals reviewed No report of cardiac concerns or symptoms Strength training completed today  Goals Unmet:  Not Applicable  Comments: Check out at 10:30am.   Dr. Kate Sable is Medical Director for [facility] and Pulmonary Rehab.

## 2019-12-02 ENCOUNTER — Encounter (HOSPITAL_COMMUNITY): Payer: Medicare Other

## 2019-12-09 NOTE — Progress Notes (Signed)
Cardiology Office Note   Date:  12/10/2019   ID:  Kathleen Terrell, Kathleen Terrell Dec 11, 1946, MRN [id number]  PCP:  Rory Percy, MD    No chief complaint on file.  CAD  Wt Readings from Last 3 Encounters:  12/10/19 175 lb (79.4 kg)  11/25/19 172 lb 2.9 oz (78.1 kg)  08/27/19 175 lb 11.3 oz (79.7 kg)       History of Present Illness: Kathleen Terrell is a 73 y.o. female  who is being seen today for follow-up for CAD.  She was recently evaluated for syncope and was set up for coronary CTA which showed significant CAD.  Echo with normal LVEF, no significant valvular abnormalities. She was then referred for cardiac catheterization which revealed severe three-vessel disease and she ultimately underwent CABG surgery by Dr. Servando Snare and Dr. Kipp Brood on June 18, 2019.  She did fairly well postoperatively.  No postoperative atrial fibrillation.  LIMA to LAD, SVG to OM, SVG to RCA.   Since the last visit, she has finished cardiac rehab.  SHe has been feeling well.  Denies : Chest pain. Dizziness. Leg edema. Nitroglycerin use. Orthopnea. Palpitations. Paroxysmal nocturnal dyspnea. Shortness of breath. Syncope.   She has not started walking on her own as of yet. She has a treadmill already.  BP controlled at home.     Past Medical History:  Diagnosis Date  . Abnormal stress test   . Acute serous otitis media    LEFT EAR  . Arthritis   . Atopic eczema    DERMATITIS  . Cataracts, bilateral   . Colon adenomas   . Diverticulitis 11/24/2011  . DM (diabetes mellitus) ([place])    patient denies, not on medications, diet controlled  . Headache   . Heme + stool 03/22/2017  . History of colonic polyps 03/22/2017  . HTN (hypertension)   . Hyperlipidemia   . Hypothyroidism   . Impacted cerumen of left ear   . Low back pain   . Melena   . PONV (postoperative nausea and vomiting)   . Spondylolisthesis of lumbar region 09/06/2016  . Syncope     Past Surgical History:  Procedure Laterality Date    . BACK SURGERY  09/05/2016   L4 and L5   . COLONOSCOPY  [facility]   >5 YEARS AGO  . COLONOSCOPY  12/12/2011   Procedure: COLONOSCOPY;  Surgeon: Dorothyann Peng, MD;  [facility];  Service: Endoscopy;  Laterality: N/A;  9:00  . COLONOSCOPY N/A 04/14/2017   Procedure: COLONOSCOPY;  Surgeon: Danie Binder, MD;  [facility];  Service: Endoscopy;  Laterality: N/A;  1030 - per office, pt can't come earlier   . CORONARY ARTERY BYPASS GRAFT N/A 06/18/2019   Procedure: CORONARY ARTERY BYPASS GRAFTING (CABG) x 3, ON PUMP, LIMA TO LAD, SVG TO RCA, SVG TO OM 1, USING LEFT INTERNAL MAMMARY ARTERY AND RIGHT GREATER SAPHENOUS VEIN HARVESTED ENDOSCOPICALLY;  Surgeon: Grace Isaac, MD;  Location: [facility];  Service: Open Heart Surgery;  Laterality: N/A;  . cyst removed     under the left arm  . DILATION AND CURETTAGE OF UTERUS    . LEFT HEART CATH AND CORONARY ANGIOGRAPHY N/A 06/14/2019   Procedure: LEFT HEART CATH AND CORONARY ANGIOGRAPHY;  Surgeon: Jettie Booze, MD;  Location: [facility];  Service: Cardiovascular;  Laterality: N/A;  . POLYPECTOMY  04/14/2017   Procedure: POLYPECTOMY;  Surgeon: Danie Binder, MD;  [facility];  Service: Endoscopy;;  ascending, hepatic flexure, prox. transverse, sigmoid x3  . SALPINGOOPHORECTOMY     left  . TEE WITHOUT CARDIOVERSION N/A 06/18/2019   Procedure: TRANSESOPHAGEAL ECHOCARDIOGRAM (TEE);  Surgeon: Grace Isaac, MD;  Location: [facility];  Service: Open Heart Surgery;  Laterality: N/A;  . TUBAL LIGATION    . tubual pregnancy       Current Outpatient Medications  Medication Sig Dispense Refill  . aspirin EC 325 MG tablet Take 1 tablet (325 mg total) by mouth daily. 90 tablet 3  . atorvastatin (LIPITOR) 40 MG tablet Take 1 tablet (40 mg total) by mouth daily. 90 tablet 3  . chlorthalidone (HYGROTON) 25 MG tablet Take 1 tablet (25 mg total) by mouth daily. 90 tablet 3  . Cholecalciferol (VITAMIN D3) 2000  units TABS Take 2,000 Units by mouth daily.    Marland Kitchen levothyroxine (SYNTHROID) 75 MCG tablet Take 75 mcg by mouth daily before breakfast.    . lisinopril (ZESTRIL) 20 MG tablet Take 1 tablet (20 mg total) by mouth daily. 90 tablet 3  . metoprolol tartrate (LOPRESSOR) 50 MG tablet Take 1 tablet (50 mg total) by mouth 2 (two) times daily. 180 tablet 3  . Misc Natural Products (TART CHERRY ADVANCED PO) Take 1 capsule by mouth daily.    . Multiple Vitamins-Minerals (MULTIVITAMIN PO) Take 1 tablet by mouth daily.    . Probiotic Product (PROBIOTIC FORMULA PO) Take 1 capsule by mouth daily.      . TURMERIC CURCUMIN PO Take 1 capsule by mouth daily.     No current facility-administered medications for this visit.    Allergies:   Methionine, Darvocet [propoxyphene n-acetaminophen], Latex, and Ultram [tramadol hcl]    Social History:  The patient  reports that she quit smoking about 35 years ago. Her smoking use included cigarettes. She has a 19.00 pack-year smoking history. She has never used smokeless tobacco. She reports that she does not drink alcohol or use drugs.   Family History:  The patient's family history includes CAD in her mother; Diabetes Mellitus II in her mother; Heart attack in her brother and mother; Hypertension in her daughter and mother; Kidney disease in her brother; Stroke in her brother; Thyroid disease in her mother.    ROS:  Please see the history of present illness.   Otherwise, review of systems are positive for minimizing salt in diet;  Eats mostly chicken and fish for meat.   All other systems are reviewed and negative.    PHYSICAL EXAM: VS:  BP 138/62   Pulse 66   Ht 5\' 3"  (1.6 m)   Wt 175 lb (79.4 kg)   SpO2 97%   BMI 31.00 kg/m  , BMI Body mass index is 31 kg/m. GEN: Well nourished, well developed, in no acute distress  HEENT: normal  Neck: no JVD, carotid bruits, or masses Cardiac: RRR; no murmurs, rubs, or gallops,no edema  Respiratory:  clear to auscultation  bilaterally, normal work of breathing GI: soft, nontender, nondistended, + BS MS: no deformity or atrophy  Skin: warm and dry, no rash Neuro:  Strength and sensation are intact Psych: euthymic mood, full affect    Recent Labs: 06/19/2019: Magnesium 2.5 07/08/2019: Hemoglobin 9.6; Platelets 315 11/07/2019: ALT 21; BUN 10; Creatinine, Ser 0.87; Potassium 3.6; Sodium 141   Lipid Panel    Component Value Date/Time   CHOL 122 10/08/2019 0752   TRIG 79 10/08/2019 0752   HDL 48 10/08/2019 0752  CHOLHDL 2.5 10/08/2019 0752   LDLCALC 58 10/08/2019 0752     Other studies Reviewed: Additional studies/ records that were reviewed today with results demonstrating: labs reviewed .   ASSESSMENT AND PLAN:  1. CAD: s/p CABG.  No angina. Continue aggressive secondary prevention.   2. Hyperlipidemia:  The current medical regimen is effective;  continue present plan and medications. 3. HTN: The current medical regimen is effective;  continue present plan and medications.   Current medicines are reviewed at length with the patient today.  The patient concerns regarding her medicines were addressed.  The following changes have been made:  No change  Labs/ tests ordered today include:  No orders of the defined types were placed in this encounter.   Recommend 150 minutes/week of aerobic exercise Low fat, low carb, high fiber diet recommended  Disposition:   FU in 6 months   Signed, Larae Grooms, MD  12/10/2019 2:56 PM    [facility] [place], [place], [place]  [address] Phone: [phone]; Fax: [phone]

## 2019-12-10 ENCOUNTER — Ambulatory Visit (INDEPENDENT_AMBULATORY_CARE_PROVIDER_SITE_OTHER): Payer: Medicare Other | Admitting: Interventional Cardiology

## 2019-12-10 ENCOUNTER — Encounter: Payer: Self-pay | Admitting: Interventional Cardiology

## 2019-12-10 ENCOUNTER — Other Ambulatory Visit: Payer: Self-pay

## 2019-12-10 VITALS — BP 138/62 | HR 66 | Ht 63.0 in | Wt 175.0 lb

## 2019-12-10 DIAGNOSIS — E782 Mixed hyperlipidemia: Secondary | ICD-10-CM

## 2019-12-10 DIAGNOSIS — I25118 Atherosclerotic heart disease of native coronary artery with other forms of angina pectoris: Secondary | ICD-10-CM

## 2019-12-10 DIAGNOSIS — I10 Essential (primary) hypertension: Secondary | ICD-10-CM | POA: Diagnosis not present

## 2019-12-10 DIAGNOSIS — Z961 Presence of intraocular lens: Secondary | ICD-10-CM | POA: Diagnosis not present

## 2019-12-10 DIAGNOSIS — H35033 Hypertensive retinopathy, bilateral: Secondary | ICD-10-CM | POA: Diagnosis not present

## 2019-12-10 NOTE — Patient Instructions (Signed)
Medication Instructions:  Your physician recommends that you continue on your current medications as directed. Please refer to the Current Medication list given to you today.  *If you need a refill on your cardiac medications before your next appointment, please call your pharmacy*  Lab Work: None ordered  If you have labs (blood work) drawn today and your tests are completely normal, you will receive your results only by: . MyChart Message (if you have MyChart) OR . A paper copy in the mail If you have any lab test that is abnormal or we need to change your treatment, we will call you to review the results.  Testing/Procedures: None ordered  Follow-Up: At [facility], you and your health needs are our priority.  As part of our continuing mission to provide you with exceptional heart care, we have created designated Provider Care Teams.  These Care Teams include your primary Cardiologist (physician) and Advanced Practice Providers (APPs -  Physician Assistants and Nurse Practitioners) who all work together to provide you with the care you need, when you need it.  Your next appointment:   6 month(s)  The format for your next appointment:   Either In Person or Virtual  Provider:   You may see Jayadeep Varanasi, MD or one of the following Advanced Practice Providers on your designated Care Team:    Dayna Dunn, PA-C  Michele Lenze, PA-C   Other Instructions   

## 2020-01-17 DIAGNOSIS — E785 Hyperlipidemia, unspecified: Secondary | ICD-10-CM | POA: Diagnosis not present

## 2020-01-17 DIAGNOSIS — E0865 Diabetes mellitus due to underlying condition with hyperglycemia: Secondary | ICD-10-CM | POA: Diagnosis not present

## 2020-01-17 DIAGNOSIS — E039 Hypothyroidism, unspecified: Secondary | ICD-10-CM | POA: Diagnosis not present

## 2020-01-17 DIAGNOSIS — I10 Essential (primary) hypertension: Secondary | ICD-10-CM | POA: Diagnosis not present

## 2020-01-17 DIAGNOSIS — E782 Mixed hyperlipidemia: Secondary | ICD-10-CM | POA: Diagnosis not present

## 2020-02-06 ENCOUNTER — Other Ambulatory Visit: Payer: Self-pay

## 2020-02-06 ENCOUNTER — Ambulatory Visit: Payer: Medicare Other | Attending: Internal Medicine

## 2020-02-06 DIAGNOSIS — Z23 Encounter for immunization: Secondary | ICD-10-CM

## 2020-02-06 NOTE — Progress Notes (Signed)
   Covid-19 Vaccination Clinic  Name:  Kathleen Terrell    MRN: [id number] DOB: 15-Mar-1946  02/06/2020  Kathleen Terrell was observed post Covid-19 immunization for 15 minutes without incidence. She was provided with Vaccine Information Sheet and instruction to access the V-Safe system.   Kathleen Terrell was instructed to call 911 with any severe reactions post vaccine: Marland Kitchen Difficulty breathing  . Swelling of your face and throat  . A fast heartbeat  . A bad rash all over your body  . Dizziness and weakness    Immunizations Administered    Name Date Dose VIS Date Route   Moderna COVID-19 Vaccine 02/06/2020 11:04 AM 0.5 mL 11/26/2019 Intramuscular   Manufacturer: Moderna   Lot: [id number]   [id number]

## 2020-03-09 ENCOUNTER — Ambulatory Visit: Payer: Medicare Other | Attending: Internal Medicine

## 2020-03-09 ENCOUNTER — Encounter: Payer: Self-pay | Admitting: Gastroenterology

## 2020-03-09 DIAGNOSIS — Z23 Encounter for immunization: Secondary | ICD-10-CM

## 2020-03-09 NOTE — Progress Notes (Signed)
   Covid-19 Vaccination Clinic  Name:  Kathleen Terrell    MRN: [id number] DOB: [birth year]  03/09/2020  Kathleen Terrell was observed post Covid-19 immunization for 15 minutes without incident. She was provided with Vaccine Information Sheet and instruction to access the V-Safe system.   Kathleen Terrell was instructed to call 911 with any severe reactions post vaccine: Marland Kitchen Difficulty breathing  . Swelling of face and throat  . A fast heartbeat  . A bad rash all over body  . Dizziness and weakness   Immunizations Administered    Name Date Dose VIS Date Route   Moderna COVID-19 Vaccine 03/09/2020 10:54 AM 0.5 mL 11/26/2019 Intramuscular   Manufacturer: Moderna   Lot: [id number]   [id number]

## 2020-05-20 DIAGNOSIS — E0865 Diabetes mellitus due to underlying condition with hyperglycemia: Secondary | ICD-10-CM | POA: Diagnosis not present

## 2020-05-20 DIAGNOSIS — E782 Mixed hyperlipidemia: Secondary | ICD-10-CM | POA: Diagnosis not present

## 2020-05-20 DIAGNOSIS — I10 Essential (primary) hypertension: Secondary | ICD-10-CM | POA: Diagnosis not present

## 2020-05-20 DIAGNOSIS — E039 Hypothyroidism, unspecified: Secondary | ICD-10-CM | POA: Diagnosis not present

## 2020-05-20 DIAGNOSIS — E785 Hyperlipidemia, unspecified: Secondary | ICD-10-CM | POA: Diagnosis not present

## 2020-05-20 DIAGNOSIS — Z0000 Encounter for general adult medical examination without abnormal findings: Secondary | ICD-10-CM | POA: Diagnosis not present

## 2020-06-15 NOTE — Progress Notes (Deleted)
Cardiology Office Note   Date:  06/16/2020   ID:  Kathleen, Terrell 1946/07/12, MRN [id number]  PCP:  Rory Percy, MD    No chief complaint on file.  CAD  Wt Readings from Last 3 Encounters:  06/16/20 180 lb 6.4 oz (81.8 kg)  12/10/19 175 lb (79.4 kg)  11/25/19 172 lb 2.9 oz (78.1 kg)       History of Present Illness: Kathleen Terrell is a 74 y.o. female  who is being seen today for follow-up for CAD. She was recently evaluated for syncope and was set up for coronary CTA which showed significant CAD. Echo with normal LVEF, no significant valvular abnormalities.She was then referred for cardiac catheterization which revealed severe three-vessel diseaseand sheultimately underwent CABG surgery by Dr. Servando Snare and Dr. Kipp Brood on June 18, 2019. She did fairly well postoperatively. No postoperative atrial fibrillation. LIMA to LAD, SVG to OM, SVG to RCA.   She has finished cardiac rehab in 2020.  Since the last visit, she has done well.  Denies : Chest pain. Dizziness. Leg edema. Nitroglycerin use. Orthopnea. Palpitations. Paroxysmal nocturnal dyspnea. Shortness of breath. Syncope.   She walks on the treadmill.  Typically 5 days/week.    Past Medical History:  Diagnosis Date  . Abnormal stress test   . Acute serous otitis media    LEFT EAR  . Arthritis   . Atopic eczema    DERMATITIS  . Cataracts, bilateral   . Colon adenomas   . Diverticulitis 11/24/2011  . DM (diabetes mellitus) ([place])    patient denies, not on medications, diet controlled  . Headache   . Heme + stool 03/22/2017  . History of colonic polyps 03/22/2017  . HTN (hypertension)   . Hyperlipidemia   . Hypothyroidism   . Impacted cerumen of left ear   . Low back pain   . Melena   . PONV (postoperative nausea and vomiting)   . Spondylolisthesis of lumbar region 09/06/2016  . Syncope     Past Surgical History:  Procedure Laterality Date  . BACK SURGERY  09/05/2016   L4 and L5   .  COLONOSCOPY  [facility]   >5 YEARS AGO  . COLONOSCOPY  12/12/2011   Procedure: COLONOSCOPY;  Surgeon: Dorothyann Peng, MD;  [facility];  Service: Endoscopy;  Laterality: N/A;  9:00  . COLONOSCOPY N/A 04/14/2017   Procedure: COLONOSCOPY;  Surgeon: Danie Binder, MD;  [facility];  Service: Endoscopy;  Laterality: N/A;  1030 - per office, pt can't come earlier   . CORONARY ARTERY BYPASS GRAFT N/A 06/18/2019   Procedure: CORONARY ARTERY BYPASS GRAFTING (CABG) x 3, ON PUMP, LIMA TO LAD, SVG TO RCA, SVG TO OM 1, USING LEFT INTERNAL MAMMARY ARTERY AND RIGHT GREATER SAPHENOUS VEIN HARVESTED ENDOSCOPICALLY;  Surgeon: Grace Isaac, MD;  Location: [facility];  Service: Open Heart Surgery;  Laterality: N/A;  . cyst removed     under the left arm  . DILATION AND CURETTAGE OF UTERUS    . LEFT HEART CATH AND CORONARY ANGIOGRAPHY N/A 06/14/2019   Procedure: LEFT HEART CATH AND CORONARY ANGIOGRAPHY;  Surgeon: Jettie Booze, MD;  Location: [facility];  Service: Cardiovascular;  Laterality: N/A;  . POLYPECTOMY  04/14/2017   Procedure: POLYPECTOMY;  Surgeon: Danie Binder, MD;  [facility];  Service: Endoscopy;;  ascending, hepatic flexure, prox. transverse, sigmoid x3  . SALPINGOOPHORECTOMY     left  .  TEE WITHOUT CARDIOVERSION N/A 06/18/2019   Procedure: TRANSESOPHAGEAL ECHOCARDIOGRAM (TEE);  Surgeon: Grace Isaac, MD;  Location: [facility];  Service: Open Heart Surgery;  Laterality: N/A;  . TUBAL LIGATION    . tubual pregnancy       Current Outpatient Medications  Medication Sig Dispense Refill  . aspirin EC 325 MG tablet Take 1 tablet (325 mg total) by mouth daily. 90 tablet 3  . atorvastatin (LIPITOR) 40 MG tablet Take 1 tablet (40 mg total) by mouth daily. 90 tablet 3  . chlorthalidone (HYGROTON) 25 MG tablet Take 1 tablet (25 mg total) by mouth daily. 90 tablet 3  . Cholecalciferol (VITAMIN D3) 2000 units TABS Take 2,000 Units by mouth daily.    Marland Kitchen  levothyroxine (SYNTHROID) 75 MCG tablet Take 75 mcg by mouth daily before breakfast.    . lisinopril (ZESTRIL) 20 MG tablet Take 1 tablet (20 mg total) by mouth daily. 90 tablet 3  . metoprolol tartrate (LOPRESSOR) 50 MG tablet Take 1 tablet (50 mg total) by mouth 2 (two) times daily. 180 tablet 3  . Misc Natural Products (TART CHERRY ADVANCED PO) Take 1 capsule by mouth daily.    . Multiple Vitamins-Minerals (MULTIVITAMIN PO) Take 1 tablet by mouth daily.    . Probiotic Product (PROBIOTIC FORMULA PO) Take 1 capsule by mouth daily.      . TURMERIC CURCUMIN PO Take 1 capsule by mouth daily.     No current facility-administered medications for this visit.    Allergies:   Methionine, Darvocet [propoxyphene n-acetaminophen], Latex, and Ultram [tramadol hcl]    Social History:  The patient  reports that she quit smoking about 36 years ago. Her smoking use included cigarettes. She has a 19.00 pack-year smoking history. She has never used smokeless tobacco. She reports that she does not drink alcohol and does not use drugs.   Family History:  The patient's ***family history includes CAD in her mother; Diabetes Mellitus II in her mother; Heart attack in her brother and mother; Hypertension in her daughter and mother; Kidney disease in her brother; Stroke in her brother; Thyroid disease in her mother.    ROS:  Please see the history of present illness.   Otherwise, review of systems are positive for .   All other systems are reviewed and negative.    PHYSICAL EXAM: VS:  BP 138/76   Pulse 62   Ht 5' 3.5" (1.613 m)   Wt 180 lb 6.4 oz (81.8 kg)   SpO2 95%   BMI 31.46 kg/m  , BMI Body mass index is 31.46 kg/m. GEN: Well nourished, well developed, in no acute distress  HEENT: normal  Neck: no JVD, carotid bruits, or masses Cardiac: RRR; no murmurs, rubs, or gallops,no edema  Respiratory:  clear to auscultation bilaterally, normal work of breathing GI: soft, nontender, nondistended, + BS MS: no  deformity or atrophy  Skin: warm and dry, no rash Neuro:  Strength and sensation are intact Psych: euthymic mood, full affect   EKG:   The ekg ordered today demonstrates NSR, no ST changes   Recent Labs: 06/19/2019: Magnesium 2.5 07/08/2019: Hemoglobin 9.6; Platelets 315 11/07/2019: ALT 21; BUN 10; Creatinine, Ser 0.87; Potassium 3.6; Sodium 141   Lipid Panel    Component Value Date/Time   CHOL 122 10/08/2019 0752   TRIG 79 10/08/2019 0752   HDL 48 10/08/2019 0752   CHOLHDL 2.5 10/08/2019 0752   LDLCALC 58 10/08/2019 0752     Other studies Reviewed:  Additional studies/ records that were reviewed today with results demonstrating: PMD labs from 5/21 reviewed.   ASSESSMENT AND PLAN:  1. CAD: s/p CABG. continue aggressive secondary prevention. 2. Hyperlipidemia: Whole food, plant-based diet recommended. 3. HTN: Low-salt diet.  Regular exercise. 4. Diet controlled DM: A1C 6.8.   Current medicines are reviewed at length with the patient today.  The patient concerns regarding her medicines were addressed.  The following changes have been made:  No change***  Labs/ tests ordered today include: *** No orders of the defined types were placed in this encounter.   Recommend 150 minutes/week of aerobic exercise Low fat, low carb, high fiber diet recommended  Disposition:   FU in ***   Signed, Larae Grooms, MD  06/16/2020 9:13 AM    [facility] [place], [place], [place]  [address] Phone: [phone]; Fax: [phone]

## 2020-06-16 ENCOUNTER — Other Ambulatory Visit: Payer: Self-pay

## 2020-06-16 ENCOUNTER — Ambulatory Visit (INDEPENDENT_AMBULATORY_CARE_PROVIDER_SITE_OTHER): Payer: Medicare Other | Admitting: Interventional Cardiology

## 2020-06-16 ENCOUNTER — Encounter: Payer: Self-pay | Admitting: Interventional Cardiology

## 2020-06-16 VITALS — BP 138/76 | HR 62 | Ht 63.5 in | Wt 180.4 lb

## 2020-06-16 DIAGNOSIS — E782 Mixed hyperlipidemia: Secondary | ICD-10-CM

## 2020-06-16 DIAGNOSIS — I10 Essential (primary) hypertension: Secondary | ICD-10-CM | POA: Diagnosis not present

## 2020-06-16 DIAGNOSIS — Z951 Presence of aortocoronary bypass graft: Secondary | ICD-10-CM

## 2020-06-16 DIAGNOSIS — I25118 Atherosclerotic heart disease of native coronary artery with other forms of angina pectoris: Secondary | ICD-10-CM | POA: Diagnosis not present

## 2020-06-16 MED ORDER — ASPIRIN EC 81 MG PO TBEC
81.0000 mg | DELAYED_RELEASE_TABLET | Freq: Every day | ORAL | 3 refills | Status: DC
Start: 2020-06-16 — End: 2021-06-24

## 2020-06-16 NOTE — Patient Instructions (Signed)
Medication Instructions:  Your physician has recommended you make the following change in your medication:   DECREASE: aspirin to 81 mg once a day  *If you need a refill on your cardiac medications before your next appointment, please call your pharmacy*   Lab Work: None  If you have labs (blood work) drawn today and your tests are completely normal, you will receive your results only by: Marland Kitchen MyChart Message (if you have MyChart) OR . A paper copy in the mail If you have any lab test that is abnormal or we need to change your treatment, we will call you to review the results.   Testing/Procedures: None   Follow-Up: At [facility], you and your health needs are our priority.  As part of our continuing mission to provide you with exceptional heart care, we have created designated Provider Care Teams.  These Care Teams include your primary Cardiologist (physician) and Advanced Practice Providers (APPs -  Physician Assistants and Nurse Practitioners) who all work together to provide you with the care you need, when you need it.  We recommend signing up for the patient portal called "MyChart".  Sign up information is provided on this After Visit Summary.  MyChart is used to connect with patients for Virtual Visits (Telemedicine).  Patients are able to view lab/test results, encounter notes, upcoming appointments, etc.  Non-urgent messages can be sent to your provider as well.   To learn more about what you can do with MyChart, go to [email or web address].    Your next appointment:   12 month(s)  The format for your next appointment:   In Person  Provider:   You may see Larae Grooms, MD or one of the following Advanced Practice Providers on your designated Care Team:    Melina Copa, PA-C  Ermalinda Barrios, PA-C    Other Instructions  High-Fiber Diet Fiber, also called dietary fiber, is a type of carbohydrate that is found in fruits, vegetables, whole grains, and beans. A  high-fiber diet can have many health benefits. Your health care provider may recommend a high-fiber diet to help:  Prevent constipation. Fiber can make your bowel movements more regular.  Lower your cholesterol.  Relieve the following conditions: ? Swelling of veins in the anus (hemorrhoids). ? Swelling and irritation (inflammation) of specific areas of the digestive tract (uncomplicated diverticulosis). ? A problem of the large intestine (colon) that sometimes causes pain and diarrhea (irritable bowel syndrome, IBS).  Prevent overeating as part of a weight-loss plan.  Prevent heart disease, type 2 diabetes, and certain cancers. What is my plan? The recommended daily fiber intake in grams (g) includes:  38 g for men age 71 or younger.  30 g for men over age 103.  34 g for women age 21 or younger.  21 g for women over age 79. You can get the recommended daily intake of dietary fiber by:  Eating a variety of fruits, vegetables, grains, and beans.  Taking a fiber supplement, if it is not possible to get enough fiber through your diet. What do I need to know about a high-fiber diet?  It is better to get fiber through food sources rather than from fiber supplements. There is not a lot of research about how effective supplements are.  Always check the fiber content on the nutrition facts label of any prepackaged food. Look for foods that contain 5 g of fiber or more per serving.  Talk with a diet and nutrition specialist (dietitian) if you  have questions about specific foods that are recommended or not recommended for your medical condition, especially if those foods are not listed below.  Gradually increase how much fiber you consume. If you increase your intake of dietary fiber too quickly, you may have bloating, cramping, or gas.  Drink plenty of water. Water helps you to digest fiber. What are tips for following this plan?  Eat a wide variety of high-fiber foods.  Make sure  that half of the grains that you eat each day are whole grains.  Eat breads and cereals that are made with whole-grain flour instead of refined flour or white flour.  Eat brown rice, bulgur wheat, or millet instead of white rice.  Start the day with a breakfast that is high in fiber, such as a cereal that contains 5 g of fiber or more per serving.  Use beans in place of meat in soups, salads, and pasta dishes.  Eat high-fiber snacks, such as berries, raw vegetables, nuts, and popcorn.  Choose whole fruits and vegetables instead of processed forms like juice or sauce. What foods can I eat?  Fruits Berries. Pears. Apples. Oranges. Avocado. Prunes and raisins. Dried figs. Vegetables Sweet potatoes. Spinach. Kale. Artichokes. Cabbage. Broccoli. Cauliflower. Green peas. Carrots. Squash. Grains Whole-grain breads. Multigrain cereal. Oats and oatmeal. Brown rice. Barley. Bulgur wheat. [place]. Quinoa. Bran muffins. Popcorn. Rye wafer crackers. Meats and other proteins Navy, kidney, and pinto beans. Soybeans. Split peas. Lentils. Nuts and seeds. Dairy Fiber-fortified yogurt. Beverages Fiber-fortified soy milk. Fiber-fortified orange juice. Other foods Fiber bars. The items listed above may not be a complete list of recommended foods and beverages. Contact a dietitian for more options. What foods are not recommended? Fruits Fruit juice. Cooked, strained fruit. Vegetables Fried potatoes. Canned vegetables. Well-cooked vegetables. Grains White bread. Pasta made with refined flour. White rice. Meats and other proteins Fatty cuts of meat. Fried chicken or fried fish. Dairy Milk. Yogurt. Cream cheese. Sour cream. Fats and oils Butters. Beverages Soft drinks. Other foods Cakes and pastries. The items listed above may not be a complete list of foods and beverages to avoid. Contact a dietitian for more information. Summary  Fiber is a type of carbohydrate. It is found in fruits,  vegetables, whole grains, and beans.  There are many health benefits of eating a high-fiber diet, such as preventing constipation, lowering blood cholesterol, helping with weight loss, and reducing your risk of heart disease, diabetes, and certain cancers.  Gradually increase your intake of fiber. Increasing too fast can result in cramping, bloating, and gas. Drink plenty of water while you increase your fiber.  The best sources of fiber include whole fruits and vegetables, whole grains, nuts, seeds, and beans. This information is not intended to replace advice given to you by your health care provider. Make sure you discuss any questions you have with your health care provider. Document Revised: 10/16/2017 Document Reviewed: 10/16/2017 Elsevier Patient Education  2020 [employer].

## 2020-06-16 NOTE — Progress Notes (Signed)
Cardiology Office Note   Date:  06/16/2020   ID:  Kathleen, Terrell 1946/11/27, MRN [id number]  PCP:  Rory Percy, MD    No chief complaint on file.  CAD  Wt Readings from Last 3 Encounters:  06/16/20 180 lb 6.4 oz (81.8 kg)  12/10/19 175 lb (79.4 kg)  11/25/19 172 lb 2.9 oz (78.1 kg)       History of Present Illness: Kathleen Terrell is a 74 y.o. female  who is being seen today for follow-up for CAD. She was recently evaluated for syncope and was set up for coronary CTA which showed significant CAD. Echo with normal LVEF, no significant valvular abnormalities.She was then referred for cardiac catheterization which revealed severe three-vessel diseaseand sheultimately underwent CABG surgery by Dr. Servando Snare and Dr. Kipp Brood on June 18, 2019. She did fairly well postoperatively. No postoperative atrial fibrillation. LIMA to LAD, SVG to OM, SVG to RCA.   She has finished cardiac rehab in 2020.  Since the last visit, she has done well.  Denies : Chest pain. Dizziness. Leg edema. Nitroglycerin use. Orthopnea. Palpitations. Paroxysmal nocturnal dyspnea. Shortness of breath. Syncope.   She walks on the treadmill.  Typically 5 days/week.    She got her [place] vaccines.  Past Medical History:  Diagnosis Date  . Abnormal stress test   . Acute serous otitis media    LEFT EAR  . Arthritis   . Atopic eczema    DERMATITIS  . Cataracts, bilateral   . Colon adenomas   . Diverticulitis 11/24/2011  . DM (diabetes mellitus) ([place])    patient denies, not on medications, diet controlled  . Headache   . Heme + stool 03/22/2017  . History of colonic polyps 03/22/2017  . HTN (hypertension)   . Hyperlipidemia   . Hypothyroidism   . Impacted cerumen of left ear   . Low back pain   . Melena   . PONV (postoperative nausea and vomiting)   . Spondylolisthesis of lumbar region 09/06/2016  . Syncope     Past Surgical History:  Procedure Laterality Date  . BACK SURGERY   09/05/2016   L4 and L5   . COLONOSCOPY  [facility]   >5 YEARS AGO  . COLONOSCOPY  12/12/2011   Procedure: COLONOSCOPY;  Surgeon: Dorothyann Peng, MD;  [facility];  Service: Endoscopy;  Laterality: N/A;  9:00  . COLONOSCOPY N/A 04/14/2017   Procedure: COLONOSCOPY;  Surgeon: Danie Binder, MD;  [facility];  Service: Endoscopy;  Laterality: N/A;  1030 - per office, pt can't come earlier   . CORONARY ARTERY BYPASS GRAFT N/A 06/18/2019   Procedure: CORONARY ARTERY BYPASS GRAFTING (CABG) x 3, ON PUMP, LIMA TO LAD, SVG TO RCA, SVG TO OM 1, USING LEFT INTERNAL MAMMARY ARTERY AND RIGHT GREATER SAPHENOUS VEIN HARVESTED ENDOSCOPICALLY;  Surgeon: Grace Isaac, MD;  Location: [facility];  Service: Open Heart Surgery;  Laterality: N/A;  . cyst removed     under the left arm  . DILATION AND CURETTAGE OF UTERUS    . LEFT HEART CATH AND CORONARY ANGIOGRAPHY N/A 06/14/2019   Procedure: LEFT HEART CATH AND CORONARY ANGIOGRAPHY;  Surgeon: Jettie Booze, MD;  Location: [facility];  Service: Cardiovascular;  Laterality: N/A;  . POLYPECTOMY  04/14/2017   Procedure: POLYPECTOMY;  Surgeon: Danie Binder, MD;  [facility];  Service: Endoscopy;;  ascending, hepatic flexure, prox. transverse, sigmoid x3  .  SALPINGOOPHORECTOMY     left  . TEE WITHOUT CARDIOVERSION N/A 06/18/2019   Procedure: TRANSESOPHAGEAL ECHOCARDIOGRAM (TEE);  Surgeon: Grace Isaac, MD;  Location: [facility];  Service: Open Heart Surgery;  Laterality: N/A;  . TUBAL LIGATION    . tubual pregnancy       Current Outpatient Medications  Medication Sig Dispense Refill  . aspirin EC 325 MG tablet Take 1 tablet (325 mg total) by mouth daily. 90 tablet 3  . atorvastatin (LIPITOR) 40 MG tablet Take 1 tablet (40 mg total) by mouth daily. 90 tablet 3  . chlorthalidone (HYGROTON) 25 MG tablet Take 1 tablet (25 mg total) by mouth daily. 90 tablet 3  . Cholecalciferol (VITAMIN D3) 2000 units TABS Take 2,000  Units by mouth daily.    Marland Kitchen levothyroxine (SYNTHROID) 75 MCG tablet Take 75 mcg by mouth daily before breakfast.    . lisinopril (ZESTRIL) 20 MG tablet Take 1 tablet (20 mg total) by mouth daily. 90 tablet 3  . metoprolol tartrate (LOPRESSOR) 50 MG tablet Take 1 tablet (50 mg total) by mouth 2 (two) times daily. 180 tablet 3  . Misc Natural Products (TART CHERRY ADVANCED PO) Take 1 capsule by mouth daily.    . Multiple Vitamins-Minerals (MULTIVITAMIN PO) Take 1 tablet by mouth daily.    . Probiotic Product (PROBIOTIC FORMULA PO) Take 1 capsule by mouth daily.      . TURMERIC CURCUMIN PO Take 1 capsule by mouth daily.     No current facility-administered medications for this visit.    Allergies:   Methionine, Darvocet [propoxyphene n-acetaminophen], Latex, and Ultram [tramadol hcl]    Social History:  The patient  reports that she quit smoking about 36 years ago. Her smoking use included cigarettes. She has a 19.00 pack-year smoking history. She has never used smokeless tobacco. She reports that she does not drink alcohol and does not use drugs.   Family History:  The patient's family history includes CAD in her mother; Diabetes Mellitus II in her mother; Heart attack in her brother and mother; Hypertension in her daughter and mother; Kidney disease in her brother; Stroke in her brother; Thyroid disease in her mother.    ROS:  Please see the history of present illness.   Otherwise, review of systems are positive for .   All other systems are reviewed and negative.    PHYSICAL EXAM: VS:  BP 138/76   Pulse 62   Ht 5' 3.5" (1.613 m)   Wt 180 lb 6.4 oz (81.8 kg)   SpO2 95%   BMI 31.46 kg/m  , BMI Body mass index is 31.46 kg/m. GEN: Well nourished, well developed, in no acute distress  HEENT: normal  Neck: no JVD, carotid bruits, or masses Cardiac: RRR; no murmurs, rubs, or gallops,no edema  Respiratory:  clear to auscultation bilaterally, normal work of breathing GI: soft, nontender,  nondistended, + BS MS: no deformity or atrophy  Skin: warm and dry, no rash Neuro:  Strength and sensation are intact Psych: euthymic mood, full affect   EKG:   The ekg ordered today demonstrates NSR, no ST changes   Recent Labs: 06/19/2019: Magnesium 2.5 07/08/2019: Hemoglobin 9.6; Platelets 315 11/07/2019: ALT 21; BUN 10; Creatinine, Ser 0.87; Potassium 3.6; Sodium 141   Lipid Panel    Component Value Date/Time   CHOL 122 10/08/2019 0752   TRIG 79 10/08/2019 0752   HDL 48 10/08/2019 0752   CHOLHDL 2.5 10/08/2019 0752   LDLCALC 58 10/08/2019  0752     Other studies Reviewed: Additional studies/ records that were reviewed today with results demonstrating: PMD labs from 5/21 reviewed.   ASSESSMENT AND PLAN:  1. CAD: s/p CABG. continue aggressive secondary prevention.  No angina on medical therapy. 2. Hyperlipidemia: Whole food, plant-based diet recommended. 3. HTN: Low-salt diet.  Regular exercise.  Target of 5 days a week, 30 minutes a day. 4. Diet controlled DM: A1C 6.8.  Avoiding medicines at this time as she is controlled with diet.  If A1c increased over 7, would start with Metformin since there are cardiac benefits with this medication.   Current medicines are reviewed at length with the patient today.  The patient concerns regarding her medicines were addressed.  The following changes have been made:  No change  Labs/ tests ordered today include:  No orders of the defined types were placed in this encounter.   Recommend 150 minutes/week of aerobic exercise Low fat, low carb, high fiber diet recommended  Disposition:   FU in 1 year   Signed, Larae Grooms, MD  06/16/2020 9:13 AM    [facility] [address], [place], [place]  [address] Phone: [phone]; Fax: [phone]

## 2020-07-06 ENCOUNTER — Other Ambulatory Visit: Payer: Self-pay | Admitting: Interventional Cardiology

## 2020-09-07 DIAGNOSIS — Z1231 Encounter for screening mammogram for malignant neoplasm of breast: Secondary | ICD-10-CM | POA: Diagnosis not present

## 2020-10-12 DIAGNOSIS — M810 Age-related osteoporosis without current pathological fracture: Secondary | ICD-10-CM | POA: Diagnosis not present

## 2020-10-15 ENCOUNTER — Other Ambulatory Visit: Payer: Self-pay | Admitting: Interventional Cardiology

## 2020-10-15 DIAGNOSIS — I10 Essential (primary) hypertension: Secondary | ICD-10-CM

## 2020-11-16 DIAGNOSIS — H6120 Impacted cerumen, unspecified ear: Secondary | ICD-10-CM | POA: Diagnosis not present

## 2020-11-16 DIAGNOSIS — Z23 Encounter for immunization: Secondary | ICD-10-CM | POA: Diagnosis not present

## 2020-11-16 DIAGNOSIS — E785 Hyperlipidemia, unspecified: Secondary | ICD-10-CM | POA: Diagnosis not present

## 2020-11-16 DIAGNOSIS — I2510 Atherosclerotic heart disease of native coronary artery without angina pectoris: Secondary | ICD-10-CM | POA: Diagnosis not present

## 2020-11-16 DIAGNOSIS — I10 Essential (primary) hypertension: Secondary | ICD-10-CM | POA: Diagnosis not present

## 2020-11-16 DIAGNOSIS — E0865 Diabetes mellitus due to underlying condition with hyperglycemia: Secondary | ICD-10-CM | POA: Diagnosis not present

## 2020-11-16 DIAGNOSIS — E782 Mixed hyperlipidemia: Secondary | ICD-10-CM | POA: Diagnosis not present

## 2020-11-24 DIAGNOSIS — I10 Essential (primary) hypertension: Secondary | ICD-10-CM | POA: Diagnosis not present

## 2020-11-24 DIAGNOSIS — E119 Type 2 diabetes mellitus without complications: Secondary | ICD-10-CM | POA: Diagnosis not present

## 2020-11-24 DIAGNOSIS — E7849 Other hyperlipidemia: Secondary | ICD-10-CM | POA: Diagnosis not present

## 2020-12-11 DIAGNOSIS — Z961 Presence of intraocular lens: Secondary | ICD-10-CM | POA: Diagnosis not present

## 2020-12-11 DIAGNOSIS — H35033 Hypertensive retinopathy, bilateral: Secondary | ICD-10-CM | POA: Diagnosis not present

## 2020-12-17 IMAGING — DX PORTABLE CHEST - 1 VIEW
1 series · 1 of 1 positions shown · non-contrast
Comparison: Chest x-ray from yesterday.

CLINICAL DATA: CABG.

EXAM:
PORTABLE CHEST 1 VIEW

[chest ap]
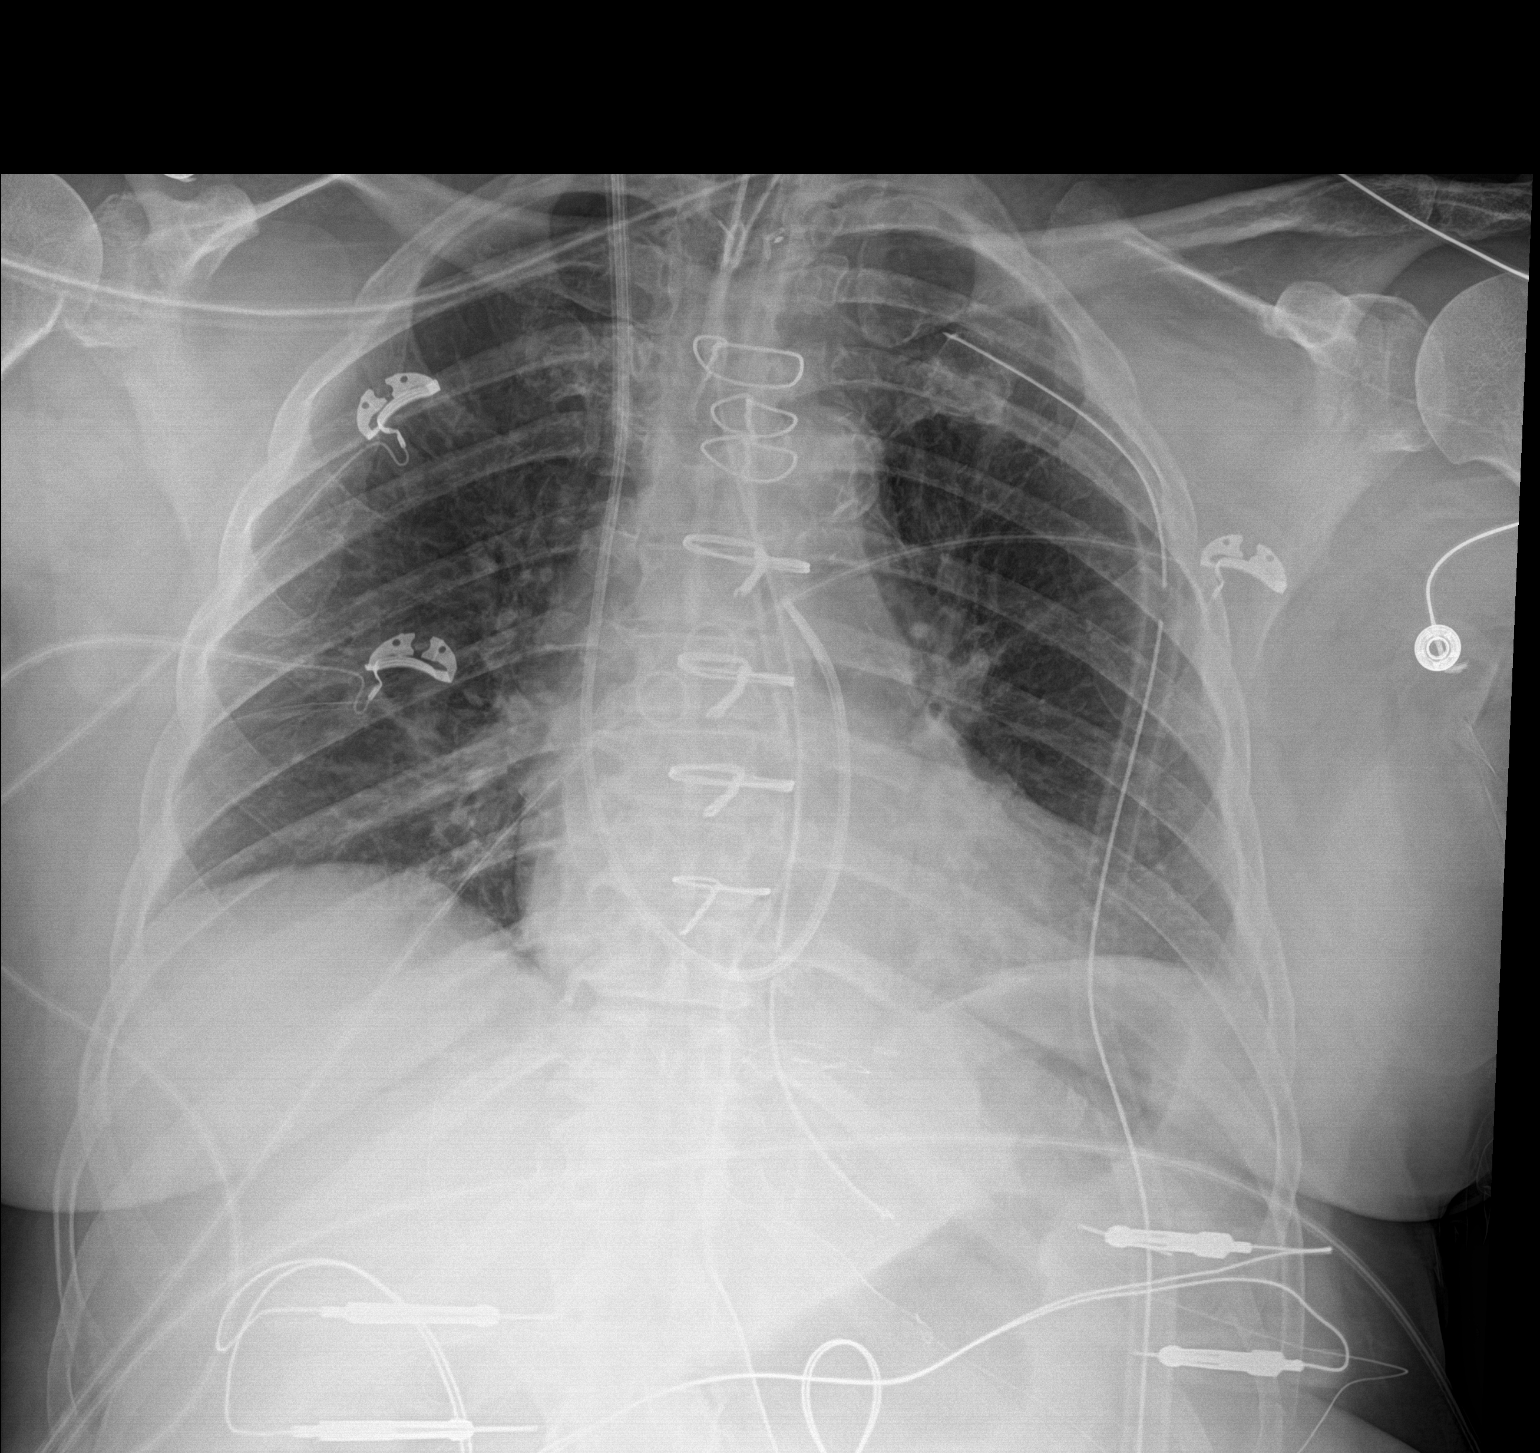

[1 of 1 positions shown; findings below may reference images not displayed]

FINDINGS: Endotracheal tube in position with the tip 3.7 cm above the level of
the carina. Enteric tube entering the stomach with the proximal side
port in the distal esophagus. Right internal jugular Swan-Ganz
catheter with the tip in the main pulmonary outflow tract.
Mediastinal and left chest tubes are in good position.

Interval CABG. Normal heart size. Low lung volumes with
bronchovascular crowding. Minimal bibasilar atelectasis. No pleural
effusion or pneumothorax. No acute osseous abnormality.
IMPRESSION: 1. Enteric tube proximal side port in the distal esophagus.
Recommend advancing 5 cm.
2. Other lines and tubes are appropriately positioned.
3. Interval CABG.  Minimal bibasilar atelectasis.

## 2020-12-18 IMAGING — DX PORTABLE CHEST - 1 VIEW
1 series · 1 of 1 positions shown · non-contrast
Comparison: Radiograph June 18, 2019.

CLINICAL DATA: Status post coronary artery bypass graft.

EXAM:
PORTABLE CHEST 1 VIEW

[chest ap]
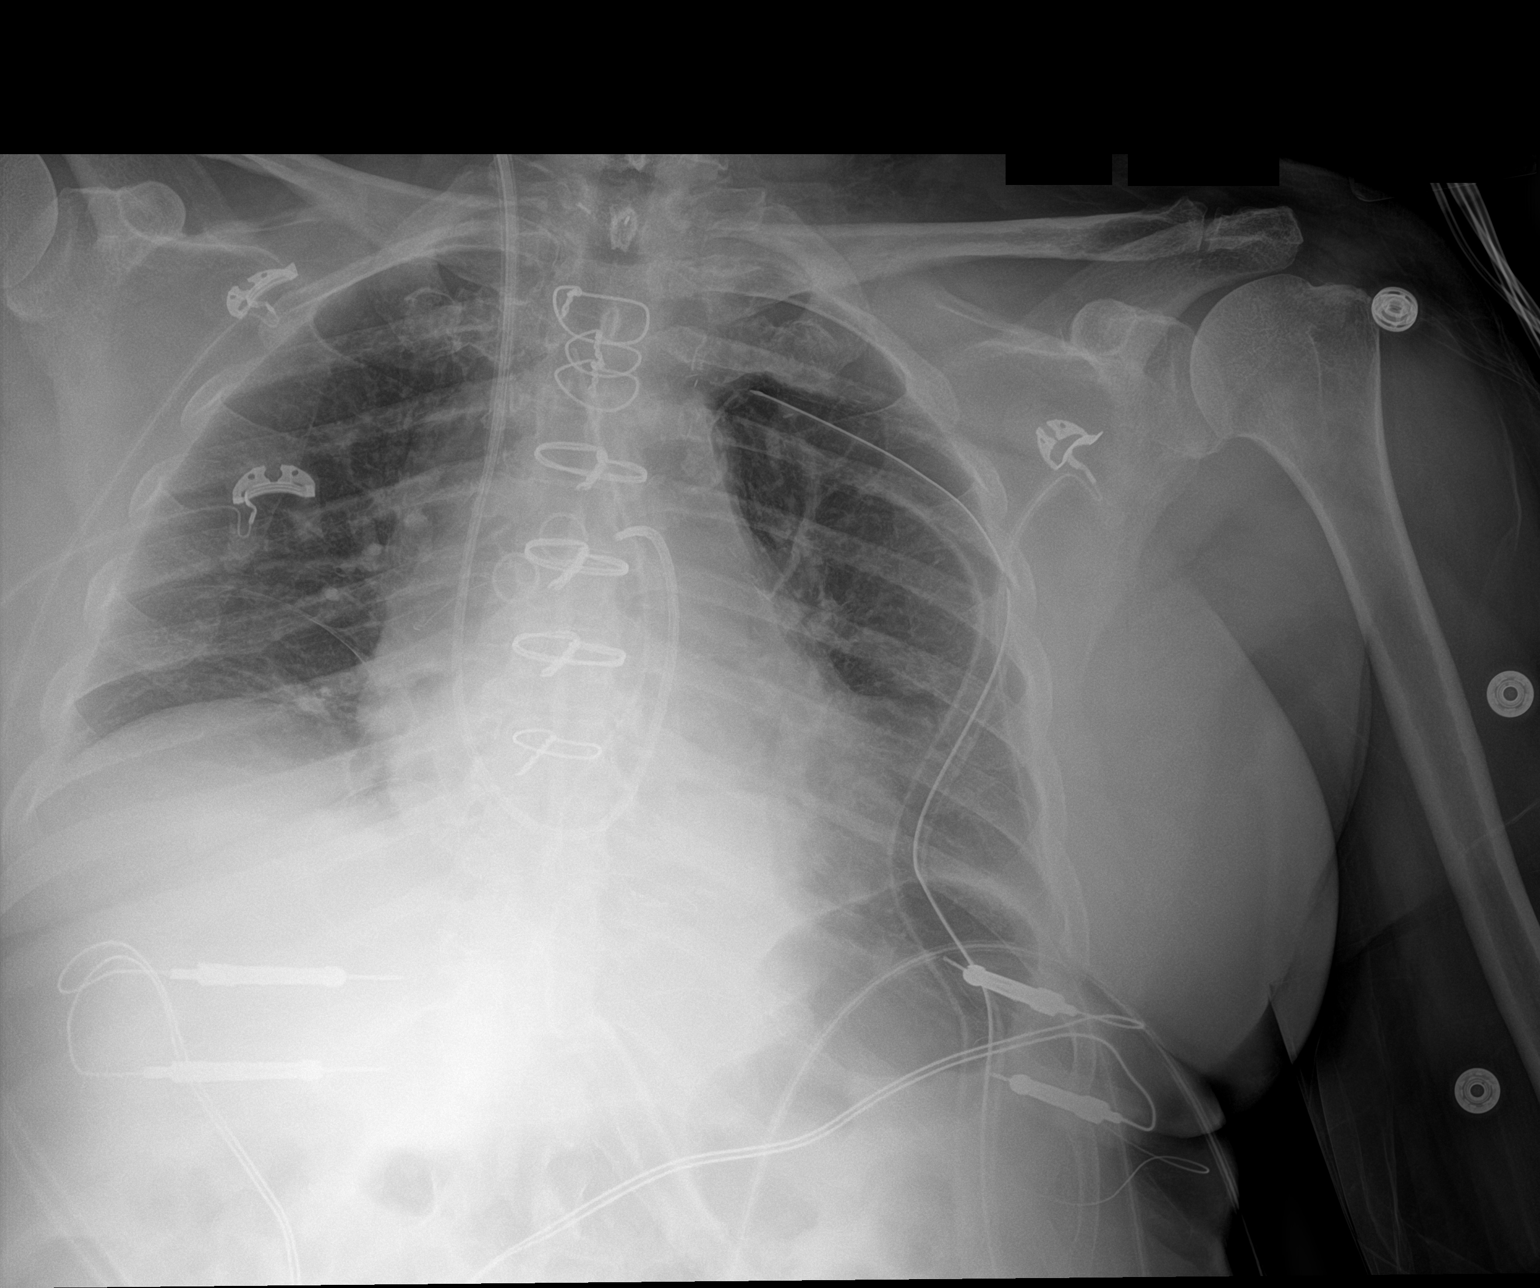

[1 of 1 positions shown; findings below may reference images not displayed]

FINDINGS: Stable cardiomediastinal silhouette. Status post coronary artery
bypass graft. Endotracheal and nasogastric tubes have been removed.
Left-sided chest tube is noted without pneumothorax. Right internal
jugular Swan-Ganz catheter is unchanged. No pneumothorax or
significant pleural effusion is noted. Minimal bibasilar
subsegmental atelectasis is noted. Bony thorax is unremarkable.
IMPRESSION: Endotracheal and nasogastric tubes have been removed. Left-sided
chest tube is unchanged in position without pneumothorax. Minimal
bibasilar subsegmental atelectasis.

## 2020-12-19 IMAGING — DX PORTABLE CHEST - 1 VIEW
1 series · 1 of 1 positions shown · non-contrast
Comparison: Radiograph June 19, 2019.

CLINICAL DATA: Status post coronary bypass graft.

EXAM:
PORTABLE CHEST 1 VIEW

[chest ap]
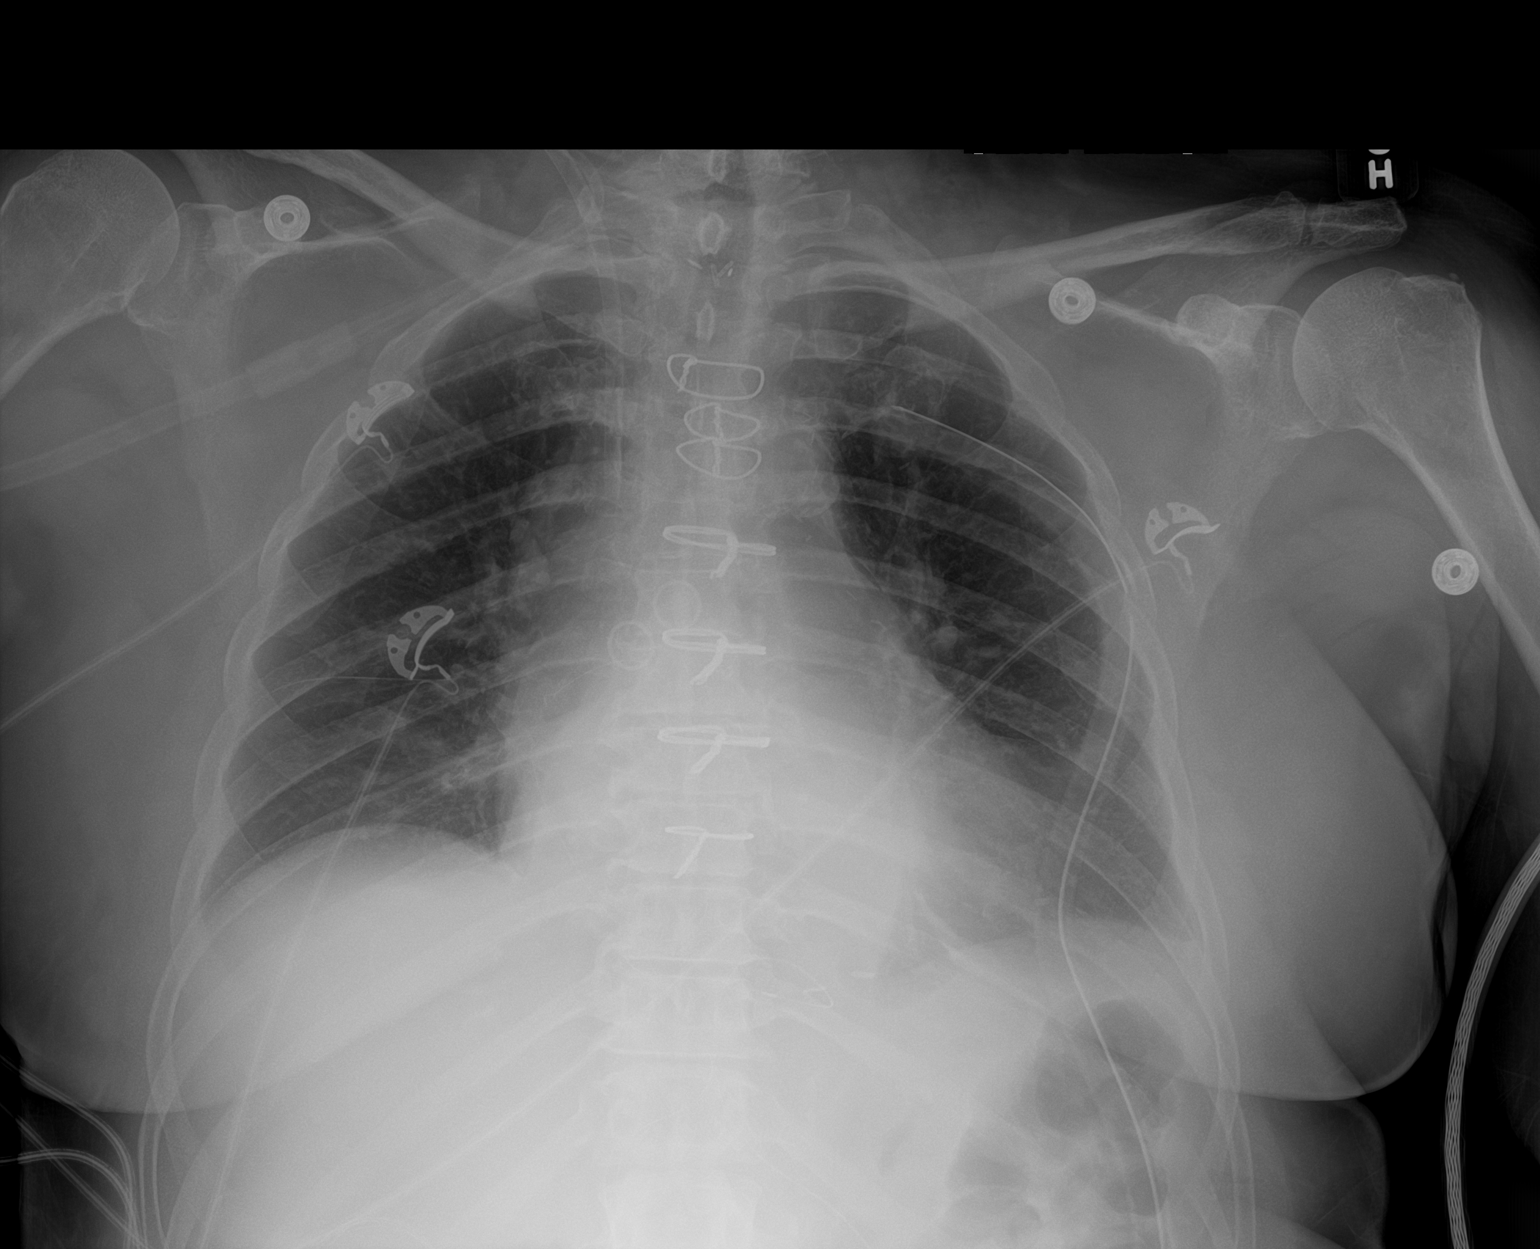

[1 of 1 positions shown; findings below may reference images not displayed]

FINDINGS: Stable cardiomediastinal silhouette. Status post coronary bypass
graft. Left-sided pacemaker is noted without pneumothorax. Right
internal jugular Swan-Ganz catheter has been removed. Venous sheath
remains. Minimal bibasilar subsegmental atelectasis is noted. Bony
thorax is unremarkable.
IMPRESSION: Stable position of left-sided chest tube without pneumothorax.
Minimal bibasilar subsegmental atelectasis is noted.

## 2020-12-20 IMAGING — CR CHEST - 2 VIEW
2 series · 2 of 2 positions shown · non-contrast
Comparison: Chest x-ray from yesterday.

CLINICAL DATA: Status post CABG.

EXAM:
CHEST - 2 VIEW

[chest lat]
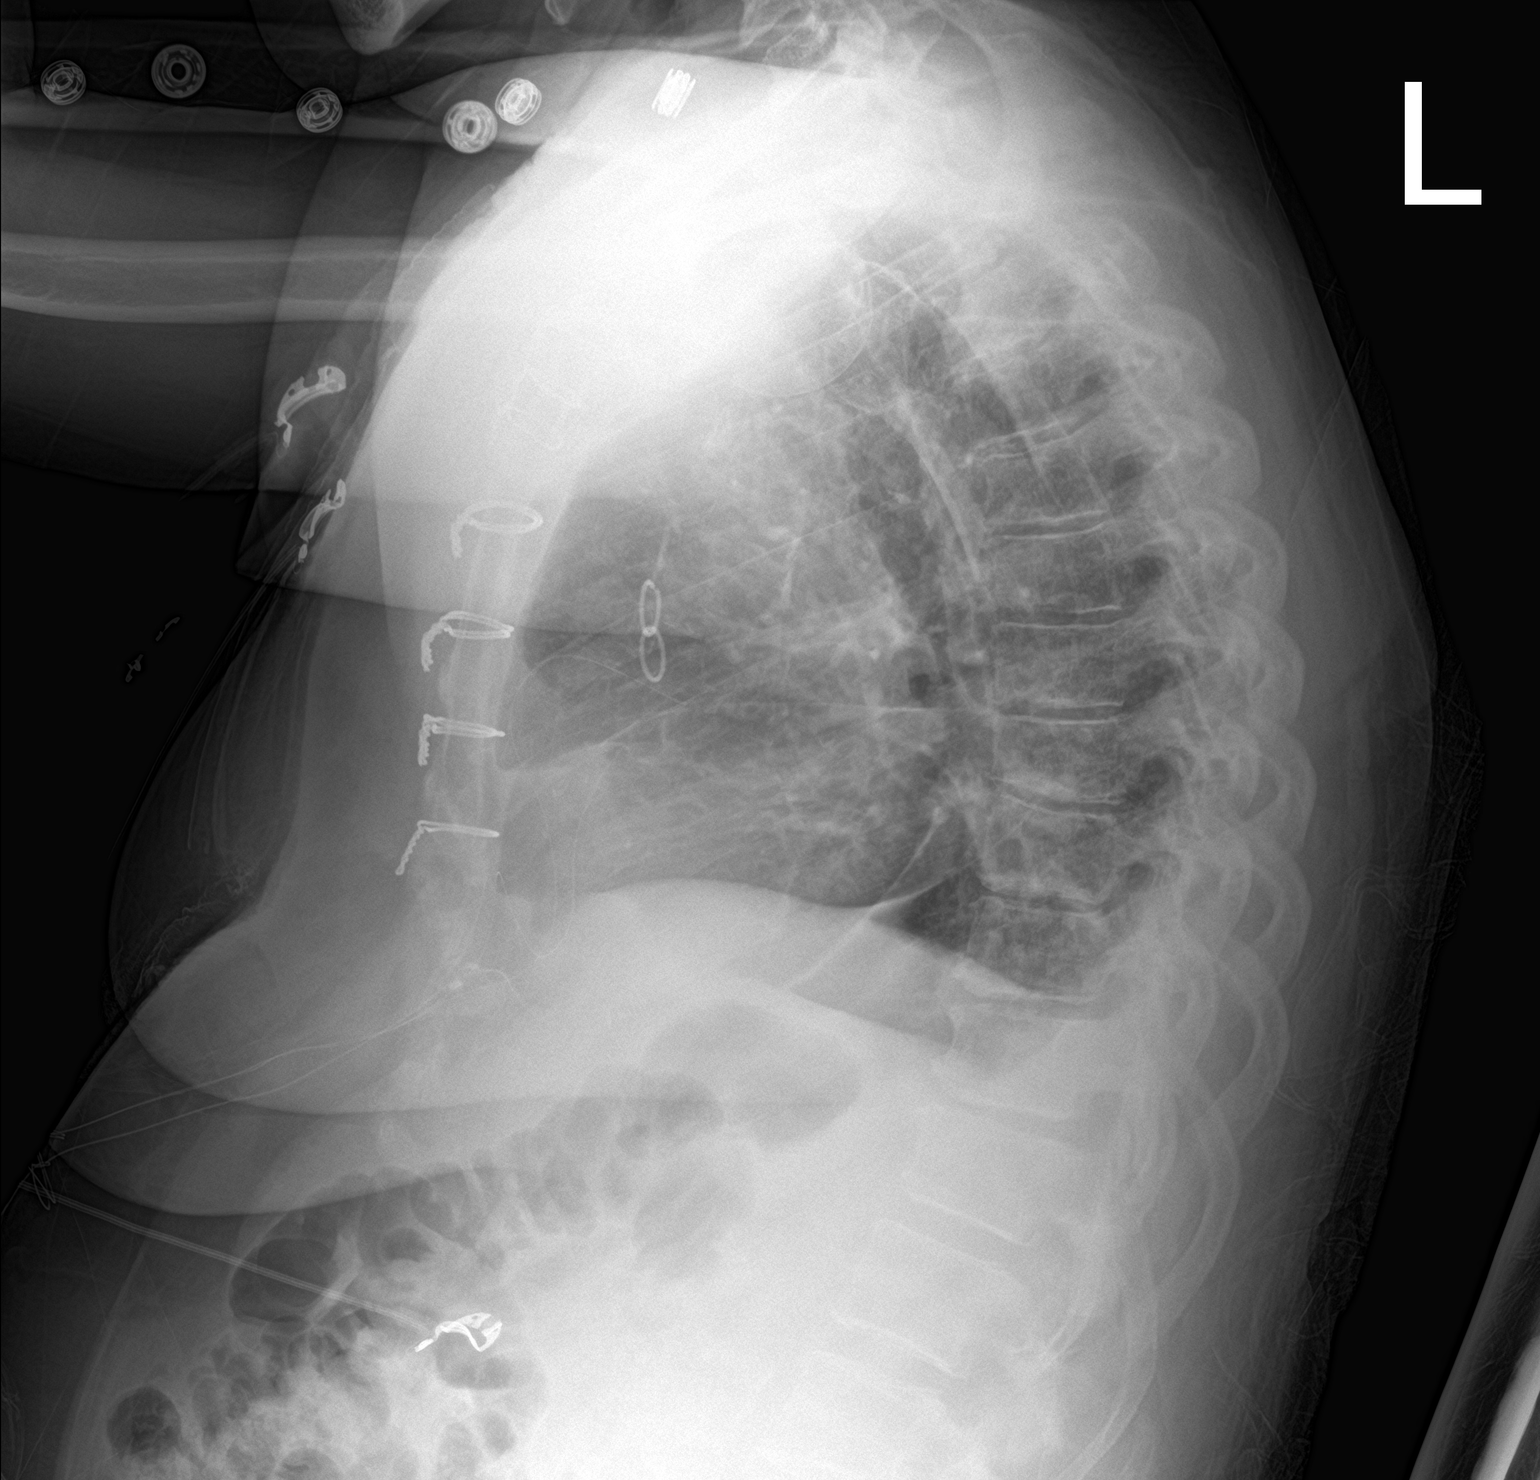

[chest ap]
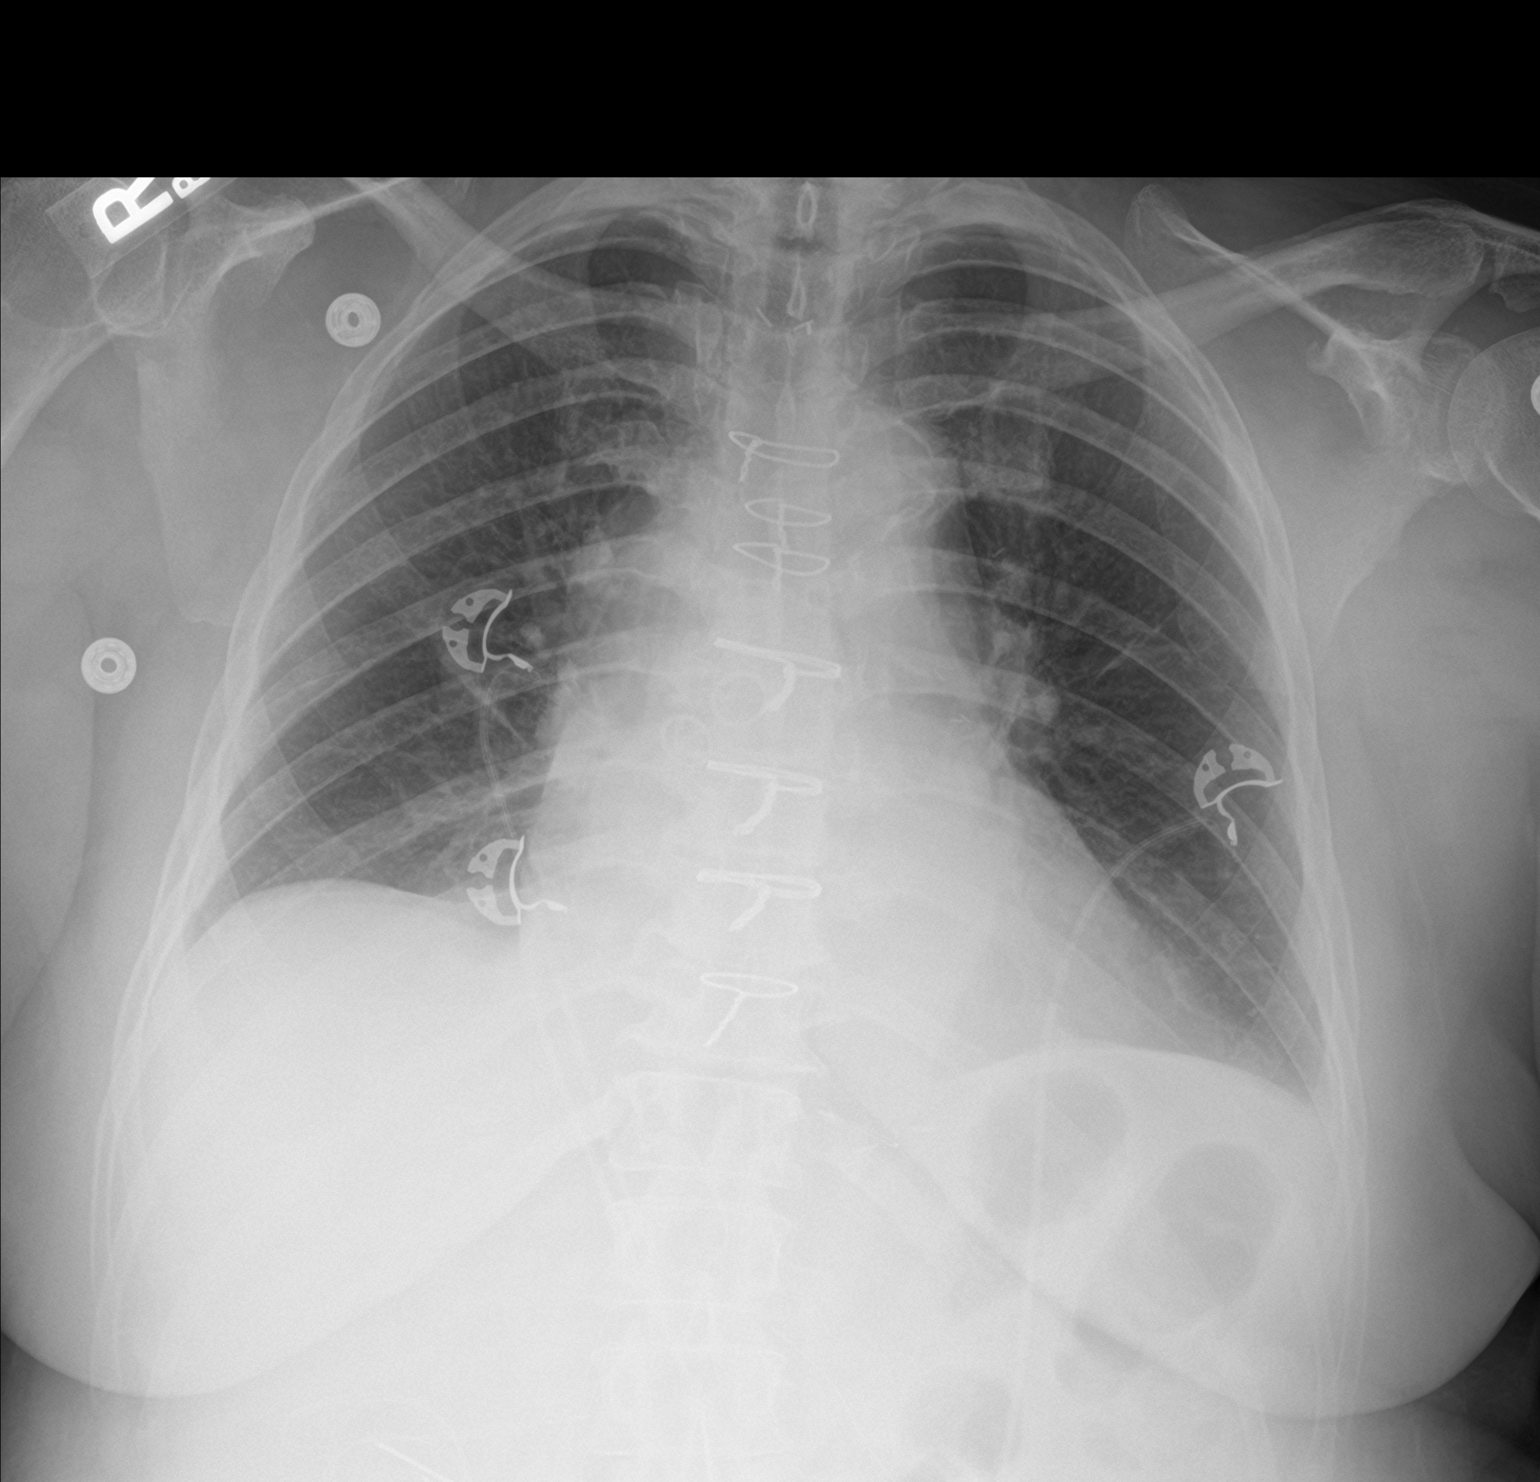

[2 of 2 positions shown; findings below may reference images not displayed]

FINDINGS: Interval removal of the right internal jugular sheath and left-sided
chest tube. Stable cardiomediastinal silhouette status post CABG.
Atherosclerotic calcification of the aortic arch. Normal pulmonary
vascularity. Minimal bibasilar atelectasis and small left pleural
effusion are unchanged. No consolidation or pneumothorax. No acute
osseous abnormality.
IMPRESSION: 1. Unchanged minimal bibasilar atelectasis and small left pleural
effusion.
2. Interval removal of the left-sided chest tube.  No pneumothorax.

## 2020-12-25 DIAGNOSIS — I10 Essential (primary) hypertension: Secondary | ICD-10-CM | POA: Diagnosis not present

## 2020-12-25 DIAGNOSIS — E7849 Other hyperlipidemia: Secondary | ICD-10-CM | POA: Diagnosis not present

## 2020-12-25 DIAGNOSIS — E119 Type 2 diabetes mellitus without complications: Secondary | ICD-10-CM | POA: Diagnosis not present

## 2021-06-22 NOTE — Progress Notes (Signed)
Cardiology Office Note   Date:  06/24/2021   ID:  Kathleen Terrell, Kathleen Terrell 07-11-1946, MRN [id number]  PCP:  Rory Percy, MD    No chief complaint on file.  CAD  Wt Readings from Last 3 Encounters:  06/24/21 177 lb 3.2 oz (80.4 kg)  06/16/20 180 lb 6.4 oz (81.8 kg)  12/10/19 175 lb (79.4 kg)       History of Present Illness: Kathleen Terrell is a 75 y.o. female  who is being seen today for follow-up for CAD.  She was recently evaluated for syncope and was set up for coronary CTA which showed significant CAD.  Echo with normal LVEF, no significant valvular abnormalities. She was then referred for cardiac catheterization which revealed severe three-vessel disease and she ultimately underwent CABG surgery by Dr. Servando Snare and Dr. Kipp Brood on June 18, 2019.  She did fairly well postoperatively.  No postoperative atrial fibrillation.  LIMA to LAD, SVG to OM, SVG to RCA.    She has finished cardiac rehab in 2020.   She got her [employer] vaccines.  Got second booster.  Denies : Chest pain. Dizziness. Leg edema. Nitroglycerin use. Orthopnea. Palpitations. Paroxysmal nocturnal dyspnea. Shortness of breath. Syncope.    She is trying to walk more.      Past Medical History:  Diagnosis Date   Abnormal stress test    Acute serous otitis media    LEFT EAR   Arthritis    Atopic eczema    DERMATITIS   Cataracts, bilateral    Colon adenomas    Diverticulitis 11/24/2011   DM (diabetes mellitus) ([place])    patient denies, not on medications, diet controlled   Headache    Heme + stool 03/22/2017   History of colonic polyps 03/22/2017   HTN (hypertension)    Hyperlipidemia    Hypothyroidism    Impacted cerumen of left ear    Low back pain    Melena    PONV (postoperative nausea and vomiting)    Spondylolisthesis of lumbar region 09/06/2016   Syncope     Past Surgical History:  Procedure Laterality Date   BACK SURGERY  09/05/2016   L4 and L5    COLONOSCOPY  [facility]   >5  YEARS AGO   COLONOSCOPY  12/12/2011   Procedure: COLONOSCOPY;  Surgeon: Dorothyann Peng, MD;  [facility];  Service: Endoscopy;  Laterality: N/A;  9:00   COLONOSCOPY N/A 04/14/2017   Procedure: COLONOSCOPY;  Surgeon: Danie Binder, MD;  [facility];  Service: Endoscopy;  Laterality: N/A;  1030 - per office, pt can't come earlier    [place] N/A 06/18/2019   Procedure: CORONARY ARTERY BYPASS GRAFTING (CABG) x 3, ON PUMP, LIMA TO LAD, SVG TO RCA, SVG TO OM 1, USING LEFT INTERNAL MAMMARY ARTERY AND RIGHT GREATER SAPHENOUS VEIN HARVESTED ENDOSCOPICALLY;  Surgeon: Grace Isaac, MD;  Location: [place];  Service: Open Heart Surgery;  Laterality: N/A;   cyst removed     under the left arm   DILATION AND CURETTAGE OF UTERUS     LEFT HEART CATH AND CORONARY ANGIOGRAPHY N/A 06/14/2019   Procedure: LEFT HEART CATH AND CORONARY ANGIOGRAPHY;  Surgeon: Jettie Booze, MD;  Location: [place] [facility];  Service: Cardiovascular;  Laterality: N/A;   POLYPECTOMY  04/14/2017   Procedure: POLYPECTOMY;  Surgeon: Danie Binder, MD;  [facility];  Service: Endoscopy;;  ascending, hepatic flexure,  prox. transverse, sigmoid x3   SALPINGOOPHORECTOMY     left   TEE WITHOUT CARDIOVERSION N/A 06/18/2019   Procedure: TRANSESOPHAGEAL ECHOCARDIOGRAM (TEE);  Surgeon: Grace Isaac, MD;  Location: [place];  Service: Open Heart Surgery;  Laterality: N/A;   TUBAL LIGATION     tubual pregnancy       Current Outpatient Medications  Medication Sig Dispense Refill   aspirin 325 MG EC tablet Take 162.5 mg by mouth daily. Take half tablet once daily by mouth 162.5 mg total.     atorvastatin (LIPITOR) 40 MG tablet TAKE 1 TABLET BY MOUTH  DAILY 90 tablet 3   chlorthalidone (HYGROTON) 25 MG tablet TAKE 1 TABLET BY MOUTH  DAILY 90 tablet 3   Cholecalciferol (VITAMIN D3) 2000 units TABS Take 2,000 Units by mouth daily.     levothyroxine (SYNTHROID) 75 MCG tablet Take  75 mcg by mouth daily before breakfast.     lisinopril (ZESTRIL) 20 MG tablet TAKE 1 TABLET BY MOUTH  DAILY 90 tablet 3   metoprolol tartrate (LOPRESSOR) 50 MG tablet TAKE 1 TABLET BY MOUTH  TWICE DAILY 180 tablet 3   Misc Natural Products (TART CHERRY ADVANCED PO) Take 1 capsule by mouth daily.     Multiple Vitamins-Minerals (MULTIVITAMIN PO) Take 1 tablet by mouth daily.     Probiotic Product (PROBIOTIC FORMULA PO) Take 1 capsule by mouth daily.       TURMERIC CURCUMIN PO Take 1 capsule by mouth daily.     No current facility-administered medications for this visit.    Allergies:   Methionine, Darvocet [propoxyphene n-acetaminophen], Latex, and Ultram [tramadol hcl]    Social History:  The patient  reports that she quit smoking about 37 years ago. Her smoking use included cigarettes. She has a 19.00 pack-year smoking history. She has never used smokeless tobacco. She reports that she does not drink alcohol and does not use drugs.   Family History:  The patient's family history includes CAD in her mother; Diabetes Mellitus II in her mother; Heart attack in her brother and mother; Hypertension in her daughter and mother; Kidney disease in her brother; Stroke in her brother; Thyroid disease in her mother.    ROS:  Please see the history of present illness.   Otherwise, review of systems are positive for varicose veins.   All other systems are reviewed and negative.    PHYSICAL EXAM: VS:  BP 136/72   Pulse (!) 56   Ht 5' 3.5" (1.613 m)   Wt 177 lb 3.2 oz (80.4 kg)   SpO2 96%   BMI 30.90 kg/m  , BMI Body mass index is 30.9 kg/m. GEN: Well nourished, well developed, in no acute distress HEENT: normal Neck: no JVD, carotid bruits, or masses Cardiac: RRR; no murmurs, rubs, or gallops,no edema  Respiratory:  clear to auscultation bilaterally, normal work of breathing GI: soft, nontender, nondistended, + BS MS: no deformity or atrophy; varicose veins bilaterally Skin: warm and dry, no  rash Neuro:  Strength and sensation are intact Psych: euthymic mood, full affect   EKG:   The ekg ordered today demonstrates sinus bradycardia, no ST changes   Recent Labs: No results found for requested labs within last 8760 hours.   Lipid Panel    Component Value Date/Time   CHOL 122 10/08/2019 0752   TRIG 79 10/08/2019 0752   HDL 48 10/08/2019 0752   CHOLHDL 2.5 10/08/2019 0752   LDLCALC 58 10/08/2019 0752  Other studies Reviewed: Additional studies/ records that were reviewed today with results demonstrating: labs reviewed.   ASSESSMENT AND PLAN:  CAD: s/p CABG.  No angina.  Continue aggressive secondary prevention.  HTN: The current medical regimen is effective;  continue present plan and medications.  Avoid processed fods.  Exercise target as noted below.  Diet controlled DM: Trying to avoid processed foods.  Increase fiber intake.  A1C 6.6.  Diet controlled.  Hyperlipidemia: The current medical regimen is effective;  continue present plan and medications.  TC 134 in 2022.  TG 86.     Current medicines are reviewed at length with the patient today.  The patient concerns regarding her medicines were addressed.  The following changes have been made:  No change  Labs/ tests ordered today include:  No orders of the defined types were placed in this encounter.   Recommend 150 minutes/week of aerobic exercise Low fat, low carb, high fiber diet recommended  Disposition:   FU in 1 year   Signed, Larae Grooms, MD  06/24/2021 9:03 AM    [facility] [address], [place], [place]  [address] Phone: [phone]; Fax: [phone]

## 2021-06-24 ENCOUNTER — Encounter (INDEPENDENT_AMBULATORY_CARE_PROVIDER_SITE_OTHER): Payer: Self-pay

## 2021-06-24 ENCOUNTER — Encounter: Payer: Self-pay | Admitting: Interventional Cardiology

## 2021-06-24 ENCOUNTER — Other Ambulatory Visit: Payer: Self-pay

## 2021-06-24 ENCOUNTER — Ambulatory Visit (INDEPENDENT_AMBULATORY_CARE_PROVIDER_SITE_OTHER): Payer: Medicare Other | Admitting: Interventional Cardiology

## 2021-06-24 VITALS — BP 136/72 | HR 56 | Ht 63.5 in | Wt 177.2 lb

## 2021-06-24 DIAGNOSIS — I25118 Atherosclerotic heart disease of native coronary artery with other forms of angina pectoris: Secondary | ICD-10-CM | POA: Diagnosis not present

## 2021-06-24 DIAGNOSIS — E782 Mixed hyperlipidemia: Secondary | ICD-10-CM

## 2021-06-24 DIAGNOSIS — I10 Essential (primary) hypertension: Secondary | ICD-10-CM | POA: Diagnosis not present

## 2021-06-24 DIAGNOSIS — Z951 Presence of aortocoronary bypass graft: Secondary | ICD-10-CM

## 2021-06-24 NOTE — Patient Instructions (Signed)
Medication Instructions:  Your physician recommends that you continue on your current medications as directed. Please refer to the Current Medication list given to you today.  *If you need a refill on your cardiac medications before your next appointment, please call your pharmacy*   Lab Work: none If you have labs (blood work) drawn today and your tests are completely normal, you will receive your results only by: [place] (if you have MyChart) OR A paper copy in the mail If you have any lab test that is abnormal or we need to change your treatment, we will call you to review the results.   Testing/Procedures: none   Follow-Up: At [facility], you and your health needs are our priority.  As part of our continuing mission to provide you with exceptional heart care, we have created designated Provider Care Teams.  These Care Teams include your primary Cardiologist (physician) and Advanced Practice Providers (APPs -  Physician Assistants and Nurse Practitioners) who all work together to provide you with the care you need, when you need it.  We recommend signing up for the patient portal called "MyChart".  Sign up information is provided on this After Visit Summary.  MyChart is used to connect with patients for Virtual Visits (Telemedicine).  Patients are able to view lab/test results, encounter notes, upcoming appointments, etc.  Non-urgent messages can be sent to your provider as well.   To learn more about what you can do with MyChart, go to [email or web address].    Your next appointment:   12 month(s)  The format for your next appointment:   In Person  Provider:   You may see Larae Grooms, MD or one of the following Advanced Practice Providers on your designated Care Team:   Melina Copa, PA-C Ermalinda Barrios, PA-C   Other Instructions High-Fiber Eating Plan Fiber, also called dietary fiber, is a type of carbohydrate. It is found foods such as fruits, vegetables,  whole grains, and beans. A high-fiber diet can have many health benefits. Your health care provider may recommend a high-fiber diet to help: Prevent constipation. Fiber can make your bowel movements more regular. Lower your cholesterol. Relieve the following conditions: Inflammation of veins in the anus (hemorrhoids). Inflammation of specific areas of the digestive tract (uncomplicated diverticulosis). A problem of the large intestine, also called the colon, that sometimes causes pain and diarrhea (irritable bowel syndrome, or IBS). Prevent overeating as part of a weight-loss plan. Prevent heart disease, type 2 diabetes, and certain cancers. What are tips for following this plan? Reading food labels  Check the nutrition facts label on food products for the amount of dietary fiber. Choose foods that have 5 grams of fiber or more per serving. The goals for recommended daily fiber intake include: Men (age 49 or younger): 34-38 g. Men (over age 62): 28-34 g. Women (age 45 or younger): 25-28 g. Women (over age 54): 22-25 g. Your daily fiber goal is _____________ g. Shopping Choose whole fruits and vegetables instead of processed forms, such as apple juice or applesauce. Choose a wide variety of high-fiber foods such as avocados, lentils, oats, and kidney beans. Read the nutrition facts label of the foods you choose. Be aware of foods with added fiber. These foods often have high sugar and sodium amounts per serving. Cooking Use whole-grain flour for baking and cooking. Cook with brown rice instead of white rice. Meal planning Start the day with a breakfast that is high in fiber, such as a cereal that  contains 5 g of fiber or more per serving. Eat breads and cereals that are made with whole-grain flour instead of refined flour or white flour. Eat brown rice, bulgur wheat, or millet instead of white rice. Use beans in place of meat in soups, salads, and pasta dishes. Be sure that half of the  grains you eat each day are whole grains. General information You can get the recommended daily intake of dietary fiber by: Eating a variety of fruits, vegetables, grains, nuts, and beans. Taking a fiber supplement if you are not able to take in enough fiber in your diet. It is better to get fiber through food than from a supplement. Gradually increase how much fiber you consume. If you increase your intake of dietary fiber too quickly, you may have bloating, cramping, or gas. Drink plenty of water to help you digest fiber. Choose high-fiber snacks, such as berries, raw vegetables, nuts, and popcorn. What foods should I eat? Fruits Berries. Pears. Apples. Oranges. Avocado. Prunes and raisins. Dried figs. Vegetables Sweet potatoes. Spinach. Kale. Artichokes. Cabbage. Broccoli. Cauliflower.Green peas. Carrots. Squash. Grains Whole-grain breads. Multigrain cereal. Oats and oatmeal. Brown rice. Barley.Bulgur wheat. [place]. Quinoa. Bran muffins. Popcorn. Rye wafer crackers. Meats and other proteins Navy beans, kidney beans, and pinto beans. Soybeans. Split peas. Lentils. Nutsand seeds. Dairy Fiber-fortified yogurt. Beverages Fiber-fortified soy milk. Fiber-fortified orange juice. Other foods Fiber bars. The items listed above may not be a complete list of recommended foods and beverages. Contact a dietitian for more information. What foods should I avoid? Fruits Fruit juice. Cooked, strained fruit. Vegetables Fried potatoes. Canned vegetables. Well-cooked vegetables. Grains White bread. Pasta made with refined flour. White rice. Meats and other proteins Fatty cuts of meat. Fried chicken or fried fish. Dairy Milk. Yogurt. Cream cheese. Sour cream. Fats and oils Butters. Beverages Soft drinks. Other foods Cakes and pastries. The items listed above may not be a complete list of foods and beverages to avoid. Talk with your dietitian about what choices are best for you. Summary Fiber  is a type of carbohydrate. It is found in foods such as fruits, vegetables, whole grains, and beans. A high-fiber diet has many benefits. It can help to prevent constipation, lower blood cholesterol, aid weight loss, and reduce your risk of heart disease, diabetes, and certain cancers. Increase your intake of fiber gradually. Increasing fiber too quickly may cause cramping, bloating, and gas. Drink plenty of water while you increase the amount of fiber you consume. The best sources of fiber include whole fruits and vegetables, whole grains, nuts, seeds, and beans. This information is not intended to replace advice given to you by your health care provider. Make sure you discuss any questions you have with your healthcare provider. Document Revised: 04/16/2020 Document Reviewed: 04/16/2020 Elsevier Patient Education  2022 [employer].

## 2021-07-04 ENCOUNTER — Other Ambulatory Visit: Payer: Self-pay | Admitting: Interventional Cardiology

## 2021-09-20 ENCOUNTER — Other Ambulatory Visit: Payer: Self-pay | Admitting: Interventional Cardiology

## 2021-09-20 DIAGNOSIS — I10 Essential (primary) hypertension: Secondary | ICD-10-CM

## 2022-06-09 ENCOUNTER — Other Ambulatory Visit: Payer: Self-pay | Admitting: Interventional Cardiology

## 2022-06-09 DIAGNOSIS — I10 Essential (primary) hypertension: Secondary | ICD-10-CM

## 2022-06-10 ENCOUNTER — Other Ambulatory Visit: Payer: Self-pay | Admitting: Interventional Cardiology

## 2022-06-10 DIAGNOSIS — I10 Essential (primary) hypertension: Secondary | ICD-10-CM

## 2022-06-22 ENCOUNTER — Encounter: Payer: Self-pay | Admitting: Internal Medicine

## 2022-06-30 ENCOUNTER — Telehealth: Payer: Self-pay | Admitting: Interventional Cardiology

## 2022-06-30 NOTE — Telephone Encounter (Signed)
Pt called to update [facility] on what occurred while on vacation in [place], [place].  Pt stated was on cruise ship, in [place], waiting in long line going through customs and felt dehydrated.   On June 14, 2022, Pt had a syncopal episode and woke up with healthcare staff around her.  The patient was taken to the ER in [place], and MD diagnosed her as having a vaso-vagal / syncopal episode.  She was cleared from the ER and was not admitted to the hospital.  The Pt daughter obtained her ER records and brought them to [facility] for her PCP to review.  Pt saw her PCP upon arriving home.     Pt wanted to know if she should be seen by HeartCare?  I asked patient if she had ANY other symptoms since she has returned to [place], or have had another syncopal event?  The patient stated NO.  When asked about SOB, chest pain, back pain, numbness / tingling, lightheadedness, dizziness, Pt denied all symptoms.    Pt scheduled to see Dr. Irish Lack on 08/11/2022 for appointment.  Pt told her if any symptoms return / or new ones occur, to contact us or go to nearest ER.   Pt  understood instructions.

## 2022-06-30 NOTE — Telephone Encounter (Signed)
Patient states when she was on a trip at [facility], [place] she passed out and she was taken to the hospital.  They did an EKG, and a CT Scan everything came back okay.  She states her PCP passed the records from [facility] to [place].  She follow up PCP and PCP said everything was okay, but she still wanted her to follow up with [place]. Patient has appt with Dr. Irish Lack on 8/17, she is wondering if she needs to be seen sooner.

## 2022-07-10 NOTE — Progress Notes (Deleted)
GI Office Note    Referring Provider: Rory Percy, MD Primary Care Physician:  Rory Percy, MD Primary GI: Dr. Abbey Chatters (previously Dr. Oneida Alar)  Date:  07/10/2022  ID:  Kathleen Terrell, DOB [birth year], MRN [id number]   Chief Complaint   No chief complaint on file.   History of Present Illness  Kathleen Terrell is a 76 y.o. female presenting today with a history of CAD s/p 3 vessel CABG in 2020, colon polyps, HTN, HLD, diabetes, diverticulitis, and hypothyroidism*** presenting today for evaluation prior to scheduling surveillance colonoscopy.  Colonoscopy December 2012. Moderate diverticulosis, multiple polyps in descending and sigmoid colon, internal hemorrhoids with biopsy with mix of polypoid colonic mucosa and tubular adenoma.   Last seen in the office March 2018. Had recent heme positive stool card. Reported hemorrhoids present and intermittent toilet tissue hematochezia. No alarm symptoms present. She was scheduled for colonoscopy.   Colonoscopy April 2018. Thre 4-6 mm polyps in proximal transverse colon, hepatic flexure, mid ascending colon. Three 2-4 mm polyps at recto-sigmoid and sigmoid colon, diverticulosis in recto-sigmoid, sigmoid, descending, and transverse colon. Redundant colon, external and internal hemorrhoids present. Pathology consistent with tubular adenomas. Recommended repeat in 3-5 years.   Per review of chart patient had last follow up with cardiology in June 2022. Doing well overall. Denied recent syncope, chest pain, or shortness of breath. Counseled on dietary modifications and advised exercise. Continue current medical therapies.   Earlier this month patient reported recent syncope episode after awaiting in customs line from a cruise in the [place]. She was taken to a local ER and had EKG and CT scan that was reported as normal. Thought to be vaso vagal response and possible dehydration. Patient scheduled for appointment with cardiology on 08/11/22.   Recent labs  performed 05/27/22: normal CBC and CMP other than mildly elevated glucose. HgbA1c 6.7. Normal TSH.    Today:   Past Medical History:  Diagnosis Date   Abnormal stress test    Acute serous otitis media    LEFT EAR   Arthritis    Atopic eczema    DERMATITIS   Cataracts, bilateral    Colon adenomas    Diverticulitis 11/24/2011   DM (diabetes mellitus) ([place])    patient denies, not on medications, diet controlled   Headache    Heme + stool 03/22/2017   History of colonic polyps 03/22/2017   HTN (hypertension)    Hyperlipidemia    Hypothyroidism    Impacted cerumen of left ear    Low back pain    Melena    PONV (postoperative nausea and vomiting)    Spondylolisthesis of lumbar region 09/06/2016   Syncope     Past Surgical History:  Procedure Laterality Date   BACK SURGERY  09/05/2016   L4 and L5    COLONOSCOPY  [facility]   >5 YEARS AGO   COLONOSCOPY  12/12/2011   Procedure: COLONOSCOPY;  Surgeon: Dorothyann Peng, MD;  [facility];  Service: Endoscopy;  Laterality: N/A;  9:00   COLONOSCOPY N/A 04/14/2017   Procedure: COLONOSCOPY;  Surgeon: Danie Binder, MD;  [facility];  Service: Endoscopy;  Laterality: N/A;  1030 - per office, pt can't come earlier    [place] N/A 06/18/2019   Procedure: CORONARY ARTERY BYPASS GRAFTING (CABG) x 3, ON PUMP, LIMA TO LAD, SVG TO RCA, SVG TO OM 1, USING LEFT INTERNAL MAMMARY ARTERY AND RIGHT GREATER SAPHENOUS VEIN HARVESTED ENDOSCOPICALLY;  Surgeon:  Grace Isaac, MD;  Location: [facility];  Service: Open Heart Surgery;  Laterality: N/A;   cyst removed     under the left arm   DILATION AND CURETTAGE OF UTERUS     LEFT HEART CATH AND CORONARY ANGIOGRAPHY N/A 06/14/2019   Procedure: LEFT HEART CATH AND CORONARY ANGIOGRAPHY;  Surgeon: Jettie Booze, MD;  Location: [facility];  Service: Cardiovascular;  Laterality: N/A;   POLYPECTOMY  04/14/2017   Procedure: POLYPECTOMY;  Surgeon: Danie Binder, MD;  [facility];  Service: Endoscopy;;  ascending, hepatic flexure, prox. transverse, sigmoid x3   SALPINGOOPHORECTOMY     left   TEE WITHOUT CARDIOVERSION N/A 06/18/2019   Procedure: TRANSESOPHAGEAL ECHOCARDIOGRAM (TEE);  Surgeon: Grace Isaac, MD;  Location: [facility];  Service: Open Heart Surgery;  Laterality: N/A;   TUBAL LIGATION     tubual pregnancy      Current Outpatient Medications  Medication Sig Dispense Refill   aspirin 325 MG EC tablet Take 162.5 mg by mouth daily. Take half tablet once daily by mouth 162.5 mg total.     atorvastatin (LIPITOR) 40 MG tablet Take 1 tablet (40 mg total) by mouth daily. Please keep your appt. with Cardiologist on 8/17 in order to receive future refills. Thank you. 90 tablet 0   chlorthalidone (HYGROTON) 25 MG tablet Take 1 tablet (25 mg total) by mouth daily. Please keep your appt. with Cardiologist on 8/17 in order to receive future refills. Thank you. 90 tablet 0   Cholecalciferol (VITAMIN D3) 2000 units TABS Take 2,000 Units by mouth daily.     levothyroxine (SYNTHROID) 75 MCG tablet Take 75 mcg by mouth daily before breakfast.     lisinopril (ZESTRIL) 20 MG tablet Take 1 tablet (20 mg total) by mouth daily. Please keep your appt. with Cardiologist on 8/17 in order to receive future refills. Thank you. 90 tablet 0   metoprolol tartrate (LOPRESSOR) 50 MG tablet Take 1 tablet (50 mg total) by mouth 2 (two) times daily. Please keep your appt. with Cardiologist on 8/17 in order to receive future refills. Thank you. 180 tablet 0   Misc Natural Products (TART CHERRY ADVANCED PO) Take 1 capsule by mouth daily.     Multiple Vitamins-Minerals (MULTIVITAMIN PO) Take 1 tablet by mouth daily.     Probiotic Product (PROBIOTIC FORMULA PO) Take 1 capsule by mouth daily.       TURMERIC CURCUMIN PO Take 1 capsule by mouth daily.     No current facility-administered medications for this visit.    Allergies as of 07/12/2022 - Review Complete  06/24/2021  Allergen Reaction Noted   Methionine Other (See Comments) 04/15/2011   Darvocet [propoxyphene n-acetaminophen] Nausea Only and Other (See Comments) 12/12/2011   Latex Rash 08/23/2016   Ultram [place] Mode hcl] Nausea And Vomiting 05/27/2019    Family History  Problem Relation Age of Onset   Heart attack Mother    Hypertension Mother    Thyroid disease Mother    CAD Mother    Diabetes Mellitus II Mother    Kidney disease Brother    Stroke Brother    Heart attack Brother    Hypertension Daughter    Colon cancer Neg Hx    Colon polyps Neg Hx     Social History   Socioeconomic History   Marital status: Widowed    Spouse name: Not on file   Number of children: Not on file   Years of education:  Not on file   Highest education level: Not on file  Occupational History   Occupation: HAIR DRESSER- [employer]  Tobacco Use   Smoking status: Former    Packs/day: 1.00    Years: 19.00    Total pack years: 19.00    Types: Cigarettes    Quit date: 03/09/1984    Years since quitting: 38.3   Smokeless tobacco: Never  Vaping Use   Vaping Use: Never used  Substance and Sexual Activity   Alcohol use: No   Drug use: No   Sexual activity: Not on file  Other Topics Concern   Not on file  Social History Narrative   Used to work for [employer] 76 yo   Social Determinants of Health   Financial Resource Strain: Not on file  Food Insecurity: Not on file  Transportation Needs: Not on file  Physical Activity: Not on file  Stress: Not on file  Social Connections: Not on file     Review of Systems   Gen: Denies fever, chills, anorexia. Denies fatigue, weakness, weight loss.  CV: Denies chest pain, palpitations, syncope, peripheral edema, and claudication. Resp: Denies dyspnea at rest, cough, wheezing, coughing up blood, and pleurisy. GI: see HPI Derm: Denies rash, itching, dry skin Psych: Denies depression, anxiety, memory loss, confusion. No  homicidal or suicidal ideation.  Heme: Denies bruising, bleeding, and enlarged lymph nodes.   Physical Exam   There were no vitals taken for this visit.  General:   Alert and oriented. No distress noted. Pleasant and cooperative.  Head:  Normocephalic and atraumatic. Eyes:  Conjuctiva clear without scleral icterus. Mouth:  Oral mucosa pink and moist. Good dentition. No lesions. Lungs:  Clear to auscultation bilaterally. No wheezes, rales, or rhonchi. No distress.  Heart:  S1, S2 present without murmurs appreciated.  Abdomen:  +BS, soft, non-tender and non-distended. No rebound or guarding. No HSM or masses noted. Rectal: *** Msk:  Symmetrical without gross deformities. Normal posture. Extremities:  Without edema. Neurologic:  Alert and  oriented x4 Psych:  Alert and cooperative. Normal mood and affect.   Assessment  Kathleen Terrell is a 76 y.o. female with a history of CAD s/p 3 vessel CABG in 2020, colon polyps, HTN, HLD, diabetes, diverticulitis, and hypothyroidism*** presenting today for evaluation prior to scheduling surveillance colonoscopy.  History of colon polyps: Colonoscopy with polyps consistent with tubular adenomas in 2012 and 2018. Prior history of positive FOBT however patient did have history of hemorrhoids.   Patient had recent episode of syncope earlier this month after a cruise. Is to be seen by cardiology for follow up and further evaluation next month. Will schedule patient's procedures after she has had cardiac evaluation and they have given clearance to proceed with scheduling.    PLAN   *** Will proceed with TCS after cardiac evaluation has been performed and clearance given.    Venetia Night, MSN, FNP-BC, AGACNP-BC [facility]

## 2022-07-12 ENCOUNTER — Encounter: Payer: Self-pay | Admitting: Internal Medicine

## 2022-07-12 ENCOUNTER — Ambulatory Visit: Payer: Medicare Other | Admitting: Gastroenterology

## 2022-07-27 ENCOUNTER — Encounter: Payer: Self-pay | Admitting: *Deleted

## 2022-08-10 NOTE — Progress Notes (Unsigned)
Cardiology Office Note   Date:  08/11/2022   ID:  Kathleen, Terrell February 16, 1946, MRN [id number]  PCP:  Rory Percy, MD    No chief complaint on file.  CAD  Wt Readings from Last 3 Encounters:  08/11/22 178 lb 9.6 oz (81 kg)  06/24/21 177 lb 3.2 oz (80.4 kg)  06/16/20 180 lb 6.4 oz (81.8 kg)       History of Present Illness: Kathleen Terrell is a 76 y.o. female  who is being seen today for follow-up for CAD.  She was recently evaluated for syncope and was set up for coronary CTA which showed significant CAD.  Echo with normal LVEF, no significant valvular abnormalities. She was then referred for cardiac catheterization which revealed severe three-vessel disease and she ultimately underwent CABG surgery by Dr. Servando Snare and Dr. Kipp Brood on June 18, 2019.  She did fairly well postoperatively.  No postoperative atrial fibrillation.  LIMA to LAD, SVG to OM, SVG to RCA.    She has finished cardiac rehab in 2020.   She got her [place] vaccines.  Got second booster.  She was standing in line at [employer] and she passed out. She recovered after a minute.  ECG was ok.  BP was a little low.  SHe had some nausea. She had been having some diarrhea prior to this and had to go while in line, but was holding it.  She had some lightheadedness.    She went to ER in [facility]: "Cerebral atrophy. Microvascular ischemic changes in the periventricular white matter. No evidence of intracranial hemorrhage."  Past Medical History:  Diagnosis Date   Abnormal stress test    Acute serous otitis media    LEFT EAR   Arthritis    Atopic eczema    DERMATITIS   Cataracts, bilateral    Colon adenomas    Diverticulitis 11/24/2011   DM (diabetes mellitus) ([place])    patient denies, not on medications, diet controlled   Headache    Heme + stool 03/22/2017   History of colonic polyps 03/22/2017   HTN (hypertension)    Hyperlipidemia    Hypothyroidism    Impacted cerumen of left ear    Low back pain     Melena    PONV (postoperative nausea and vomiting)    Spondylolisthesis of lumbar region 09/06/2016   Syncope     Past Surgical History:  Procedure Laterality Date   BACK SURGERY  09/05/2016   L4 and L5    COLONOSCOPY  [facility]   >5 YEARS AGO   COLONOSCOPY  12/12/2011   Procedure: COLONOSCOPY;  Surgeon: Dorothyann Peng, MD;  [facility];  Service: Endoscopy;  Laterality: N/A;  9:00   COLONOSCOPY N/A 04/14/2017   Procedure: COLONOSCOPY;  Surgeon: Danie Binder, MD;  [facility];  Service: Endoscopy;  Laterality: N/A;  1030 - per office, pt can't come earlier    [place] N/A 06/18/2019   Procedure: CORONARY ARTERY BYPASS GRAFTING (CABG) x 3, ON PUMP, LIMA TO LAD, SVG TO RCA, SVG TO OM 1, USING LEFT INTERNAL MAMMARY ARTERY AND RIGHT GREATER SAPHENOUS VEIN HARVESTED ENDOSCOPICALLY;  Surgeon: Grace Isaac, MD;  Location: [facility];  Service: Open Heart Surgery;  Laterality: N/A;   cyst removed     under the left arm   DILATION AND CURETTAGE OF UTERUS     LEFT HEART CATH AND CORONARY ANGIOGRAPHY N/A 06/14/2019   Procedure:  LEFT HEART CATH AND CORONARY ANGIOGRAPHY;  Surgeon: Jettie Booze, MD;  Location: [facility];  Service: Cardiovascular;  Laterality: N/A;   POLYPECTOMY  04/14/2017   Procedure: POLYPECTOMY;  Surgeon: Danie Binder, MD;  [facility];  Service: Endoscopy;;  ascending, hepatic flexure, prox. transverse, sigmoid x3   SALPINGOOPHORECTOMY     left   TEE WITHOUT CARDIOVERSION N/A 06/18/2019   Procedure: TRANSESOPHAGEAL ECHOCARDIOGRAM (TEE);  Surgeon: Grace Isaac, MD;  Location: [place];  Service: Open Heart Surgery;  Laterality: N/A;   TUBAL LIGATION     tubual pregnancy       Current Outpatient Medications  Medication Sig Dispense Refill   aspirin 325 MG EC tablet Take 162.5 mg by mouth daily. Take half tablet once daily by mouth 162.5 mg total.     atorvastatin (LIPITOR) 40 MG tablet Take 1  tablet (40 mg total) by mouth daily. Please keep your appt. with Cardiologist on 8/17 in order to receive future refills. Thank you. 90 tablet 0   chlorthalidone (HYGROTON) 25 MG tablet Take 1 tablet (25 mg total) by mouth daily. Please keep your appt. with Cardiologist on 8/17 in order to receive future refills. Thank you. 90 tablet 0   Cholecalciferol (VITAMIN D3) 2000 units TABS Take 2,000 Units by mouth daily.     levothyroxine (SYNTHROID) 75 MCG tablet Take 75 mcg by mouth daily before breakfast.     lisinopril (ZESTRIL) 20 MG tablet Take 1 tablet (20 mg total) by mouth daily. Please keep your appt. with Cardiologist on 8/17 in order to receive future refills. Thank you. 90 tablet 0   metoprolol tartrate (LOPRESSOR) 50 MG tablet Take 1 tablet (50 mg total) by mouth 2 (two) times daily. Please keep your appt. with Cardiologist on 8/17 in order to receive future refills. Thank you. 180 tablet 0   Misc Natural Products (TART CHERRY ADVANCED PO) Take 1 capsule by mouth daily.     Multiple Vitamins-Minerals (MULTIVITAMIN PO) Take 1 tablet by mouth daily.     Probiotic Product (PROBIOTIC FORMULA PO) Take 1 capsule by mouth daily.       TURMERIC CURCUMIN PO Take 1 capsule by mouth daily.     No current facility-administered medications for this visit.    Allergies:   Methionine, Darvocet [propoxyphene n-acetaminophen], Latex, and Ultram [tramadol hcl]    Social History:  The patient  reports that she quit smoking about 38 years ago. Her smoking use included cigarettes. She has a 19.00 pack-year smoking history. She has never used smokeless tobacco. She reports that she does not drink alcohol and does not use drugs.   Family History:  The patient's family history includes CAD in her mother; Diabetes Mellitus II in her mother; Heart attack in her brother and mother; Hypertension in her daughter and mother; Kidney disease in her brother; Stroke in her brother; Thyroid disease in her mother.    ROS:   Please see the history of present illness.   Otherwise, review of systems are positive for .   All other systems are reviewed and negative.    PHYSICAL EXAM: VS:  BP 138/76   Pulse (!) 52   Ht 5' 3.5" (1.613 m)   Wt 178 lb 9.6 oz (81 kg)   SpO2 98%   BMI 31.14 kg/m  , BMI Body mass index is 31.14 kg/m. GEN: Well nourished, well developed, in no acute distress HEENT: normal Neck: no JVD, carotid bruits, or masses  Cardiac: bradycardia; no murmurs, rubs, or gallops,no edema  Respiratory:  clear to auscultation bilaterally, normal work of breathing GI: soft, nontender, nondistended, + BS MS: no deformity or atrophy Skin: warm and dry, no rash Neuro:  Strength and sensation are intact Psych: euthymic mood, full affect   EKG:   The ekg ordered today demonstrates sinus bradycardia, ant T wave changes, No significant changes   Recent Labs: No results found for requested labs within last 365 days.   Lipid Panel    Component Value Date/Time   CHOL 122 10/08/2019 0752   TRIG 79 10/08/2019 0752   HDL 48 10/08/2019 0752   CHOLHDL 2.5 10/08/2019 0752   LDLCALC 58 10/08/2019 0752     Other studies Reviewed: Additional studies/ records that were reviewed today with results demonstrating: records from [place] reviewed.   ASSESSMENT AND PLAN:  CAD: No angina. Continue aggressive secondary prevention.  HTN: Home readings in the 110-120s range.  Decrease metoprolol to 25 mg BID.  Diet controlled DM: In 6.7 in 6/23.  High-fiber diet.  Avoid processed foods. Hyperlipidemia: LDL 64. Continue aggressive lipid lowering therapy. High dose atorvastatin.   Syncope: Likely due to dehydration in the setting of diarrhea.   Current medicines are reviewed at length with the patient today.  The patient concerns regarding her medicines were addressed.  The following changes have been made:  No change  Labs/ tests ordered today include:  No orders of the defined types were placed in this  encounter.   Recommend 150 minutes/week of aerobic exercise Low fat, low carb, high fiber diet recommended  Disposition:   FU in 1 year   Signed, Larae Grooms, MD  08/11/2022 9:50 AM    [facility] [address], [place], [place]  [address] Phone: [phone]; Fax: [phone]

## 2022-08-11 ENCOUNTER — Encounter: Payer: Self-pay | Admitting: Interventional Cardiology

## 2022-08-11 ENCOUNTER — Ambulatory Visit (INDEPENDENT_AMBULATORY_CARE_PROVIDER_SITE_OTHER): Payer: Medicare Other | Admitting: Interventional Cardiology

## 2022-08-11 VITALS — BP 138/76 | HR 52 | Ht 63.5 in | Wt 178.6 lb

## 2022-08-11 DIAGNOSIS — Z951 Presence of aortocoronary bypass graft: Secondary | ICD-10-CM

## 2022-08-11 DIAGNOSIS — I10 Essential (primary) hypertension: Secondary | ICD-10-CM | POA: Diagnosis not present

## 2022-08-11 DIAGNOSIS — I25118 Atherosclerotic heart disease of native coronary artery with other forms of angina pectoris: Secondary | ICD-10-CM | POA: Diagnosis not present

## 2022-08-11 DIAGNOSIS — E782 Mixed hyperlipidemia: Secondary | ICD-10-CM

## 2022-08-11 MED ORDER — METOPROLOL TARTRATE 25 MG PO TABS: 25.0000 mg | ORAL_TABLET | Freq: Two times a day (BID) | ORAL | 3 refills | Status: DC

## 2022-08-11 NOTE — Patient Instructions (Signed)
Medication Instructions:  Your physician has recommended you make the following change in your medication:  Metoprolol '25mg'$  2 x daily  *If you need a refill on your cardiac medications before your next appointment, please call your pharmacy*   Follow-Up: At [facility] you and your health needs are our priority.  As part of our continuing mission to provide you with exceptional heart care, we have created designated Provider Care Teams.  These Care Teams include your primary Cardiologist (physician) and Advanced Practice Providers (APPs -  Physician Assistants and Nurse Practitioners) who all work together to provide you with the care you need, when you need it.   Your next appointment:   6 month(s)  The format for your next appointment:   In Person  Provider:   Larae Grooms, MD {

## 2022-08-21 NOTE — Progress Notes (Unsigned)
GI Office Note    Referring Provider: Rory Percy, MD Primary Care Physician:  Rory Percy, MD  Primary Gastroenterologist: Dr. Abbey Chatters  Chief Complaint   Chief Complaint  Patient presents with   Colonoscopy    Doing well    History of Present Illness   Kathleen Terrell is a 76 y.o. female presenting today at the request of Rory Percy, MD with a history of CAD s/p 3 vessel CABG in 2020, colon polyps, HTN, HLD, diabetes, diverticulitis, and hypothyroidism presenting today for evaluation prior to scheduling surveillance colonoscopy.   Colonoscopy December 2012. Moderate diverticulosis, multiple polyps in descending and sigmoid colon, internal hemorrhoids with biopsy with mix of polypoid colonic mucosa and tubular adenoma.    Last seen in the office March 2018. Had recent heme positive stool card. Reported hemorrhoids present and intermittent toilet tissue hematochezia. No alarm symptoms present. She was scheduled for colonoscopy.    Colonoscopy April 2018. Thre 4-6 mm polyps in proximal transverse colon, hepatic flexure, mid ascending colon. Three 2-4 mm polyps at recto-sigmoid and sigmoid colon, diverticulosis in recto-sigmoid, sigmoid, descending, and transverse colon. Redundant colon, external and internal hemorrhoids present. Pathology consistent with tubular adenomas. Recommended repeat in 3-5 years.    Per review of chart patient had last follow up with cardiology in June 2022. Doing well overall. Denied recent syncope, chest pain, or shortness of breath. Counseled on dietary modifications and advised exercise. Continue current medical therapies.    Earlier this month patient reported recent syncope episode after awaiting in customs line from a cruise in the [place]. She was taken to a local ER and had EKG and CT scan that was reported as normal. Thought to be vaso vagal response and possible dehydration. Patient scheduled for appointment with cardiology on 08/11/22.  Seen  cardiology 08/11/22 and reported they felt as though syncope likely due to dehydration in the setting of diarrhea and given her decreased BP her metoprolol was decreased. Advised follow up in 1 year.    Recent labs performed 05/27/22: normal CBC and CMP other than mildly elevated glucose. HgbA1c 6.7. Normal TSH.      Today: Has been having daily bowel movements. She reports when there is a change in venue she does not go every day. She reports she has been checking her BP almost daily lately and had been running low and cardiology dropped her metoprolol dose. Has not had anymore loose stools since then. Denies abdominal pain, reflux, melena, brbpr, unintentional weight loss, lack of appetite. Does have some early satiety but nothing significant.  Denies any recent chest pain, shortness of breath or peripheral edema.   She reports some post op nausea that occurs.   She reports her bowels got loose while in customs and when she came back to the line and when she went to sit down on her luggage she felt lightheaded and then woke up to medical professionals. Her daughters reported she was pale and she lost control of her bowel.   She reports she called to cancel her last appt for her daughters surgery and left a message but never received a call back. Has lots of things on her Calendar in September therefore would like to wait until end of September or in October.   Current Outpatient Medications  Medication Sig Dispense Refill   aspirin 325 MG EC tablet Take 162.5 mg by mouth daily. Take half tablet once daily by mouth 162.5 mg total.     atorvastatin (LIPITOR)  40 MG tablet Take 1 tablet (40 mg total) by mouth daily. Please keep your appt. with Cardiologist on 8/17 in order to receive future refills. Thank you. 90 tablet 0   chlorthalidone (HYGROTON) 25 MG tablet Take 1 tablet (25 mg total) by mouth daily. Please keep your appt. with Cardiologist on 8/17 in order to receive future refills. Thank you. 90  tablet 0   Cholecalciferol (VITAMIN D3) 2000 units TABS Take 2,000 Units by mouth daily.     levothyroxine (SYNTHROID) 75 MCG tablet Take 75 mcg by mouth daily before breakfast.     lisinopril (ZESTRIL) 20 MG tablet Take 1 tablet (20 mg total) by mouth daily. Please keep your appt. with Cardiologist on 8/17 in order to receive future refills. Thank you. 90 tablet 0   metoprolol tartrate (LOPRESSOR) 25 MG tablet Take 1 tablet (25 mg total) by mouth 2 (two) times daily. 180 tablet 3   Misc Natural Products (TART CHERRY ADVANCED PO) Take 1 capsule by mouth daily.     Probiotic Product (PROBIOTIC FORMULA PO) Take 1 capsule by mouth daily.       TURMERIC CURCUMIN PO Take 1 capsule by mouth daily.     No current facility-administered medications for this visit.    Past Medical History:  Diagnosis Date   Abnormal stress test    Acute serous otitis media    LEFT EAR   Arthritis    Atopic eczema    DERMATITIS   Cataracts, bilateral    Colon adenomas    Diverticulitis 11/24/2011   DM (diabetes mellitus) ([place])    patient denies, not on medications, diet controlled   Headache    Heme + stool 03/22/2017   History of colonic polyps 03/22/2017   HTN (hypertension)    Hyperlipidemia    Hypothyroidism    Impacted cerumen of left ear    Low back pain    Melena    PONV (postoperative nausea and vomiting)    Spondylolisthesis of lumbar region 09/06/2016   Syncope     Past Surgical History:  Procedure Laterality Date   BACK SURGERY  09/05/2016   L4 and L5    COLONOSCOPY  [facility]   >5 YEARS AGO   COLONOSCOPY  12/12/2011   Procedure: COLONOSCOPY;  Surgeon: Dorothyann Peng, MD;  [facility];  Service: Endoscopy;  Laterality: N/A;  9:00   COLONOSCOPY N/A 04/14/2017   Procedure: COLONOSCOPY;  Surgeon: Danie Binder, MD;  [facility];  Service: Endoscopy;  Laterality: N/A;  1030 - per office, pt can't come earlier    Festus N/A 06/18/2019    Procedure: CORONARY ARTERY BYPASS GRAFTING (CABG) x 3, ON PUMP, LIMA TO LAD, SVG TO RCA, SVG TO OM 1, USING LEFT INTERNAL MAMMARY ARTERY AND RIGHT GREATER SAPHENOUS VEIN HARVESTED ENDOSCOPICALLY;  Surgeon: Grace Isaac, MD;  Location: [facility];  Service: Open Heart Surgery;  Laterality: N/A;   cyst removed     under the left arm   DILATION AND CURETTAGE OF UTERUS     LEFT HEART CATH AND CORONARY ANGIOGRAPHY N/A 06/14/2019   Procedure: LEFT HEART CATH AND CORONARY ANGIOGRAPHY;  Surgeon: Jettie Booze, MD;  Location: [facility];  Service: Cardiovascular;  Laterality: N/A;   POLYPECTOMY  04/14/2017   Procedure: POLYPECTOMY;  Surgeon: Danie Binder, MD;  [facility];  Service: Endoscopy;;  ascending, hepatic flexure, prox. transverse, sigmoid x3   SALPINGOOPHORECTOMY  left   TEE WITHOUT CARDIOVERSION N/A 06/18/2019   Procedure: TRANSESOPHAGEAL ECHOCARDIOGRAM (TEE);  Surgeon: Grace Isaac, MD;  Location: [facility];  Service: Open Heart Surgery;  Laterality: N/A;   TUBAL LIGATION     tubual pregnancy      Family History  Problem Relation Age of Onset   Heart attack Mother    Hypertension Mother    Thyroid disease Mother    CAD Mother    Diabetes Mellitus II Mother    Kidney disease Brother    Stroke Brother    Heart attack Brother    Hypertension Daughter    Colon cancer Neg Hx    Colon polyps Neg Hx     Allergies as of 08/22/2022 - Review Complete 08/22/2022  Allergen Reaction Noted   Methionine Other (See Comments) 04/15/2011   Darvocet [propoxyphene n-acetaminophen] Nausea Only and Other (See Comments) 12/12/2011   Latex Rash 08/23/2016   Ultram [tramadol hcl] Nausea And Vomiting 05/27/2019    Social History   Socioeconomic History   Marital status: Widowed    Spouse name: Not on file   Number of children: Not on file   Years of education: Not on file   Highest education level: Not on file  Occupational History   Occupation: HAIR DRESSER-  [employer]  Tobacco Use   Smoking status: Former    Packs/day: 1.00    Years: 19.00    Total pack years: 19.00    Types: Cigarettes    Quit date: 03/09/1984    Years since quitting: 38.4   Smokeless tobacco: Never  Vaping Use   Vaping Use: Never used  Substance and Sexual Activity   Alcohol use: No   Drug use: No   Sexual activity: Not on file  Other Topics Concern   Not on file  Social History Narrative   Used to work for [employer] 76 yo   Social Determinants of Health   Financial Resource Strain: Not on file  Food Insecurity: Not on file  Transportation Needs: Not on file  Physical Activity: Not on file  Stress: Not on file  Social Connections: Not on file  Intimate Partner Violence: Not on file     Review of Systems   Gen: Denies any fever, chills, fatigue, weight loss, lack of appetite.  CV: Denies chest pain, heart palpitations, peripheral edema, syncope.  Resp: Denies shortness of breath at rest or with exertion. Denies wheezing or cough.  GI: see HPI GU : Denies urinary burning, urinary frequency, urinary hesitancy MS: Denies joint pain, muscle weakness, cramps, or limitation of movement.  Derm: Denies rash, itching, dry skin Psych: Denies depression, anxiety, memory loss, and confusion Heme: Denies bruising, bleeding, and enlarged lymph nodes.   Physical Exam   BP (!) 157/76 (BP Location: Right Arm, Patient Position: Sitting, Cuff Size: Normal)   Pulse 60   Temp (!) 97.2 F (36.2 C) (Temporal)   Ht 5' 3.5" (1.613 m)   Wt 176 lb 3.2 oz (79.9 kg)   SpO2 99%   BMI 30.72 kg/m   General:   Alert and oriented. Pleasant and cooperative. Well-nourished and well-developed.  Head:  Normocephalic and atraumatic. Eyes:  Without icterus, sclera clear and conjunctiva pink.  Ears:  Normal auditory acuity. Mouth:  No deformity or lesions, oral mucosa pink.  Lungs:  Clear to auscultation bilaterally. No wheezes, rales, or rhonchi. No  distress.  Heart:  S1, S2 present without murmurs appreciated.  Abdomen:  +  BS, soft, non-tender and non-distended. No HSM noted. No guarding or rebound. No masses appreciated.  Rectal:  Deferred  Msk:  Symmetrical without gross deformities. Normal posture. Extremities:  Without edema. Neurologic:  Alert and  oriented x4;  grossly normal neurologically. Skin:  Intact without significant lesions or rashes. Psych:  Alert and cooperative. Normal mood and affect.   Assessment   EEVIE LAPP is a 76 y.o. female with a history of CAD s/p 3 vessel CABG in 2020, colon polyps, HTN, HLD, diabetes, diverticulitis, and hypothyroidism presenting today for evaluation prior to scheduling surveillance colonoscopy.   History of colon polyps: Colonoscopy with polyps consistent with tubular adenomas in 2012 and 2018. Prior history of positive FOBT however patient did have history of hemorrhoids. She denies any upper or lower GI symptoms. No alarm symptoms present. We will proceed with colonoscopy after cardiac clearance.  Recent syncopal episode after end of vacation likely related to diarrhea and dehydration. Seen cardiology last month with no pending workup but will request clearance for safety purposes.     PLAN   Proceed with colonoscopy with propofol by Dr. Abbey Chatters  in near future: the risks, benefits, and alternatives have been discussed with the patient in detail. The patient states understanding and desires to proceed. ASA 3  Cardiac clearance for procedure given recent syncopal episode.   Kathleen Night, MSN, FNP-BC, AGACNP-BC [facility]

## 2022-08-22 ENCOUNTER — Telehealth: Payer: Self-pay | Admitting: Interventional Cardiology

## 2022-08-22 ENCOUNTER — Encounter: Payer: Self-pay | Admitting: Gastroenterology

## 2022-08-22 ENCOUNTER — Encounter: Payer: Self-pay | Admitting: *Deleted

## 2022-08-22 ENCOUNTER — Telehealth: Payer: Self-pay | Admitting: *Deleted

## 2022-08-22 ENCOUNTER — Ambulatory Visit (INDEPENDENT_AMBULATORY_CARE_PROVIDER_SITE_OTHER): Payer: Medicare Other | Admitting: Gastroenterology

## 2022-08-22 VITALS — BP 157/76 | HR 60 | Temp 97.2°F | Ht 63.5 in | Wt 176.2 lb

## 2022-08-22 DIAGNOSIS — Z951 Presence of aortocoronary bypass graft: Secondary | ICD-10-CM | POA: Diagnosis not present

## 2022-08-22 DIAGNOSIS — Z86010 Personal history of colonic polyps: Secondary | ICD-10-CM | POA: Diagnosis not present

## 2022-08-22 NOTE — Telephone Encounter (Signed)
Pt agreeable to plan of care for tele pre op appt 09/19/22. Med rec and consent are done.     Patient Consent for Virtual Visit        Kathleen Terrell has provided verbal consent on 08/22/2022 for a virtual visit (video or telephone).   CONSENT FOR VIRTUAL VISIT FOR:  Kathleen Terrell  By participating in this virtual visit I agree to the following:  I hereby voluntarily request, consent and authorize [facility] and its employed or contracted physicians, physician assistants, nurse practitioners or other licensed health care professionals (the Practitioner), to provide me with telemedicine health care services (the "Services") as deemed necessary by the treating Practitioner. I acknowledge and consent to receive the Services by the Practitioner via telemedicine. I understand that the telemedicine visit will involve communicating with the Practitioner through live audiovisual communication technology and the disclosure of certain medical information by electronic transmission. I acknowledge that I have been given the opportunity to request an in-person assessment or other available alternative prior to the telemedicine visit and am voluntarily participating in the telemedicine visit.  I understand that I have the right to withhold or withdraw my consent to the use of telemedicine in the course of my care at any time, without affecting my right to future care or treatment, and that the Practitioner or I may terminate the telemedicine visit at any time. I understand that I have the right to inspect all information obtained and/or recorded in the course of the telemedicine visit and may receive copies of available information for a reasonable fee.  I understand that some of the potential risks of receiving the Services via telemedicine include:  Delay or interruption in medical evaluation due to technological equipment failure or disruption; Information transmitted may not be sufficient (e.g. poor  resolution of images) to allow for appropriate medical decision making by the Practitioner; and/or  In rare instances, security protocols could fail, causing a breach of personal health information.  Furthermore, I acknowledge that it is my responsibility to provide information about my medical history, conditions and care that is complete and accurate to the best of my ability. I acknowledge that Practitioner's advice, recommendations, and/or decision may be based on factors not within their control, such as incomplete or inaccurate data provided by me or distortions of diagnostic images or specimens that may result from electronic transmissions. I understand that the practice of medicine is not an exact science and that Practitioner makes no warranties or guarantees regarding treatment outcomes. I acknowledge that a copy of this consent can be made available to me via my patient portal ([facility]), or I can request a printed copy by calling the office of [facility].    I understand that my insurance will be billed for this visit.   I have read or had this consent read to me. I understand the contents of this consent, which adequately explains the benefits and risks of the Services being provided via telemedicine.  I have been provided ample opportunity to ask questions regarding this consent and the Services and have had my questions answered to my satisfaction. I give my informed consent for the services to be provided through the use of telemedicine in my medical care

## 2022-08-22 NOTE — Telephone Encounter (Signed)
Called cardiology office to let them know that it would be October before pt could be scheduled, pre-op team will be made aware.

## 2022-08-22 NOTE — Telephone Encounter (Signed)
   Pre-operative Risk Assessment    Patient Name: Kathleen Terrell  DOB: 1946/09/25 MRN: [id number]      Request for Surgical Clearance    Procedure:   COLONOSCOPY  Date of Surgery:  Clearance TBD PER REQUESTING OFFICE SOMETIME IN OCTOBER 2023                                Surgeon:  DR. Manus Rudd Surgeon's Group or Practice Name:  [facility] Phone number:  [phone] Fax number:  [phone]   Type of Clearance Requested:   - Medical ; NONE PER REQUEST   Type of Anesthesia:  MAC   Additional requests/questions:    Jiles Prows   08/22/2022, 4:25 PM

## 2022-08-22 NOTE — Telephone Encounter (Signed)
[facility] called stating they faxed over a clearance form earlier today and wanted to let preop know that they dont plan on doing the colonoscopy until October.

## 2022-08-22 NOTE — Patient Instructions (Addendum)
It was a pleasure to meet you today!  I hope you enjoy your upcoming festivities.  We will request cardiac clearance for your procedure given your recent syncopal episode.  Once we have obtained clearance we will call you to schedule you for your colonoscopy with Dr. Abbey Chatters!  It was a pleasure to see you today. I want to create trusting relationships with patients. If you receive a survey regarding your visit,  I greatly appreciate you taking time to fill this out on paper or through your MyChart. I value your feedback.  Venetia Night, MSN, FNP-BC, AGACNP-BC [facility]

## 2022-08-22 NOTE — Telephone Encounter (Signed)
Pt agreeable to plan of care for tele pre op appt 09/19/22. Med rec and consent are done.

## 2022-08-22 NOTE — Telephone Encounter (Signed)
   Name: Kathleen Terrell  DOB: September 23, 1946  MRN: [id number]  Primary Cardiologist: Larae Grooms, MD   Preoperative team, please contact this patient and set up a phone call appointment for further preoperative risk assessment. Please obtain consent and complete medication review. Thank you for your help.  I confirm that guidance regarding antiplatelet and oral anticoagulation therapy has been completed and, if necessary, noted below.   Richardson Dopp, PA-C 08/22/2022, 4:49 PM [facility]

## 2022-08-22 NOTE — Telephone Encounter (Signed)
Faxed form to get cardiac clearance for pt.

## 2022-08-23 NOTE — Telephone Encounter (Signed)
Patient informed will call her once October schedule opens up for Dr.Rourk.

## 2022-08-25 ENCOUNTER — Other Ambulatory Visit: Payer: Self-pay | Admitting: Interventional Cardiology

## 2022-08-25 MED ORDER — LISINOPRIL 20 MG PO TABS: 20.0000 mg | ORAL_TABLET | Freq: Every day | ORAL | 3 refills | Status: DC

## 2022-08-25 MED ORDER — ATORVASTATIN CALCIUM 40 MG PO TABS: 40.0000 mg | ORAL_TABLET | Freq: Every day | ORAL | 3 refills | Status: DC

## 2022-09-19 ENCOUNTER — Ambulatory Visit: Payer: Medicare Other | Attending: Internal Medicine | Admitting: Nurse Practitioner

## 2022-09-19 DIAGNOSIS — Z01810 Encounter for preprocedural cardiovascular examination: Secondary | ICD-10-CM | POA: Diagnosis not present

## 2022-09-19 NOTE — Progress Notes (Signed)
Virtual Visit via Telephone Note   Because of Kathleen Terrell's co-morbid illnesses, she is at least at moderate risk for complications without adequate follow up.  This format is felt to be most appropriate for this patient at this time.  The patient did not have access to video technology/had technical difficulties with video requiring transitioning to audio format only (telephone).  All issues noted in this document were discussed and addressed.  No physical exam could be performed with this format.  Please refer to the patient's chart for her consent to telehealth for [facility].  Evaluation Performed:  Preoperative cardiovascular risk assessment _____________   Date:  09/19/2022   Patient ID:  Kathleen Terrell, DOB 09/21/1946, MRN [id number] Patient Location:  Home Provider location:   Office  Primary Care Provider:  Rory Percy, MD Primary Cardiologist:  Larae Grooms, MD  Chief Complaint / Patient Profile   76 y.o. y/o female with a h/o CAD s/p CABG x3 in 05/2019, syncope, hypertension, hyperlipidemia, and type 2 diabetes who is pending colonoscopy, date TBD, with Dr. Manus Rudd of [facility] and presents today for telephonic preoperative cardiovascular risk assessment.  Past Medical History    Past Medical History:  Diagnosis Date   Abnormal stress test    Acute serous otitis media    LEFT EAR   Arthritis    Atopic eczema    DERMATITIS   CAD (coronary artery disease)    Cataracts, bilateral    Colon adenomas    Diverticulitis 11/24/2011   DM (diabetes mellitus) ([place])    patient denies, not on medications, diet controlled   Headache    Heme + stool 03/22/2017   History of colonic polyps 03/22/2017   HTN (hypertension)    Hyperlipidemia    Hypothyroidism    Impacted cerumen of left ear    Low back pain    Melena    PONV (postoperative nausea and vomiting)    Spondylolisthesis of lumbar region 09/06/2016   Syncope    Past Surgical History:   Procedure Laterality Date   BACK SURGERY  09/05/2016   L4 and L5    COLONOSCOPY  [facility]   >5 YEARS AGO   COLONOSCOPY  12/12/2011   Procedure: COLONOSCOPY;  Surgeon: Dorothyann Peng, MD;  [facility];  Service: Endoscopy;  Laterality: N/A;  9:00   COLONOSCOPY N/A 04/14/2017   Procedure: COLONOSCOPY;  Surgeon: Danie Binder, MD;  [facility];  Service: Endoscopy;  Laterality: N/A;  1030 - per office, pt can't come earlier    [place] N/A 06/18/2019   Procedure: CORONARY ARTERY BYPASS GRAFTING (CABG) x 3, ON PUMP, LIMA TO LAD, SVG TO RCA, SVG TO OM 1, USING LEFT INTERNAL MAMMARY ARTERY AND RIGHT GREATER SAPHENOUS VEIN HARVESTED ENDOSCOPICALLY;  Surgeon: Grace Isaac, MD;  Location: [facility];  Service: Open Heart Surgery;  Laterality: N/A;   cyst removed     under the left arm   DILATION AND CURETTAGE OF UTERUS     LEFT HEART CATH AND CORONARY ANGIOGRAPHY N/A 06/14/2019   Procedure: LEFT HEART CATH AND CORONARY ANGIOGRAPHY;  Surgeon: Jettie Booze, MD;  Location: [facility];  Service: Cardiovascular;  Laterality: N/A;   POLYPECTOMY  04/14/2017   Procedure: POLYPECTOMY;  Surgeon: Danie Binder, MD;  [facility];  Service: Endoscopy;;  ascending, hepatic flexure, prox. transverse, sigmoid x3   SALPINGOOPHORECTOMY     left  TEE WITHOUT CARDIOVERSION N/A 06/18/2019   Procedure: TRANSESOPHAGEAL ECHOCARDIOGRAM (TEE);  Surgeon: Grace Isaac, MD;  Location: [facility];  Service: Open Heart Surgery;  Laterality: N/A;   TUBAL LIGATION     tubual pregnancy      Allergies  Allergies  Allergen Reactions   Methionine Other (See Comments)    UNSPECIFIED REACTION  Pt not sure   Darvocet [Propoxyphene N-Acetaminophen] Nausea Only and Other (See Comments)   Latex Rash   Ultram [Tramadol Hcl] Nausea And Vomiting    History of Present Illness    Kathleen Terrell is a 76 y.o. female who presents via [employer], [employer] (consulting) for  a telehealth visit today.  Pt was last seen in cardiology clinic on 08/11/2022 by Dr. Irish Lack.  At that time Kathleen Terrell was doing well.  The patient is now pending procedure as outlined above. Since her last visit, she has done well from a cardiac standpoint. She denies chest pain, palpitations, dyspnea, pnd, orthopnea, n, v, dizziness, syncope, edema, weight gain, or early satiety. All other systems reviewed and are otherwise negative except as noted above.   Home Medications    Prior to Admission medications   Medication Sig Start Date End Date Taking? Authorizing Provider  aspirin 325 MG EC tablet Take 162.5 mg by mouth daily. Take half tablet once daily by mouth 162.5 mg total.    [provider]  aspirin EC 81 MG tablet Take 81 mg by mouth daily. Swallow whole.    [provider]  atorvastatin (LIPITOR) 40 MG tablet Take 1 tablet (40 mg total) by mouth daily. 08/25/22   Jettie Booze, MD  chlorthalidone (HYGROTON) 25 MG tablet TAKE 1 TABLET BY MOUTH DAILY 08/25/22   Jettie Booze, MD  Cholecalciferol (VITAMIN D3) 2000 units TABS Take 2,000 Units by mouth daily.    [provider]  levothyroxine (SYNTHROID) 75 MCG tablet Take 75 mcg by mouth daily before breakfast.    [provider]  lisinopril (ZESTRIL) 20 MG tablet Take 1 tablet (20 mg total) by mouth daily. 08/25/22   Jettie Booze, MD  metoprolol tartrate (LOPRESSOR) 25 MG tablet Take 1 tablet (25 mg total) by mouth 2 (two) times daily. 08/11/22   Jettie Booze, MD  Misc Natural Products (TART CHERRY ADVANCED PO) Take 1 capsule by mouth daily.    [provider]  NON FORMULARY BEET ROOT SUPPLEMENT: 1000 MG DAILY (THIS IS 1 TABLET DAILY)    [provider]  Probiotic Product (PROBIOTIC FORMULA PO) Take 1 capsule by mouth daily.      [provider]  TURMERIC CURCUMIN PO Take 1 capsule by mouth daily.    [provider]    Physical Exam     Vital Signs:  Kathleen Terrell does not have vital signs available for review today.  Given telephonic nature of communication, physical exam is limited. AAOx3. NAD. Normal affect.  Speech and respirations are unlabored.  Accessory Clinical Findings    None  Assessment & Plan    1.  Preoperative Cardiovascular Risk Assessment:  According to the Revised Cardiac Risk Index (RCRI), her Perioperative Risk of Major Cardiac Event is (%): 0.9. Her Functional Capacity in METs is: 7.01 according to the [place] Activity Status Index (DASI). Therefore, based on ACC/AHA guidelines, patient would be at acceptable risk for the planned procedure without further cardiovascular testing.  The patient was advised that if she develops new symptoms prior to surgery to contact  our office to arrange for a follow-up visit, and she verbalized understanding.  A copy of this note will be routed to requesting surgeon.  Time:   Today, I have spent 5 minutes with the patient with telehealth technology discussing medical history, symptoms, and management plan.     Kathleen Sciara, NP  09/19/2022, 10:29 AM

## 2022-09-19 NOTE — Telephone Encounter (Signed)
See pre-op notes

## 2022-09-20 NOTE — Telephone Encounter (Signed)
Will call pt to schedule once we receive future schedule 

## 2022-09-28 ENCOUNTER — Encounter: Payer: Self-pay | Admitting: *Deleted

## 2022-09-28 ENCOUNTER — Telehealth: Payer: Self-pay | Admitting: *Deleted

## 2022-09-28 MED ORDER — PEG 3350-KCL-NA BICARB-NACL 420 G PO SOLR: 4000.0000 mL | Freq: Once | ORAL | 0 refills | Status: AC

## 2022-09-28 NOTE — Telephone Encounter (Signed)
PA done via [facility]. Auth# [id number], DOS: Nov 09, 2022 - Dec 25, 2022

## 2022-09-28 NOTE — Telephone Encounter (Signed)
Spoke with pt. Scheduled for TCS with Dr. Gala Romney, ASA 3 on 11/15 at 10:45am. Aware will mail prep instructions/p[re-op appt. Rx for prep sent to [facility].

## 2022-10-31 DIAGNOSIS — Z23 Encounter for immunization: Secondary | ICD-10-CM | POA: Diagnosis not present

## 2022-11-04 NOTE — Patient Instructions (Signed)
Kathleen Terrell  11/04/2022     '@PREFPERIOPPHARMACY'$ @   Your procedure is scheduled on  11/09/2022.   Report to [facility] at  [phone] A.M.   Call this number if you have problems the morning of surgery:  [phone]  If you experience any cold or flu symptoms such as cough, fever, chills, shortness of breath, etc. between now and your scheduled surgery, please notify us at the above number.   Remember:  Follow the diet and prep instructions given to you by the office.      DO NOT take any medications for diabetes the morning of your procedure.     Take these medicines the morning of surgery with A SIP OF WATER                        levothyroxine, metoprolol.    Do not wear jewelry, make-up or nail polish.  Do not wear lotions, powders, or perfumes, or deodorant.  Do not shave 48 hours prior to surgery.  Men may shave face and neck.  Do not bring valuables to the hospital.  [facility] is not responsible for any belongings or valuables.  Contacts, dentures or bridgework may not be worn into surgery.  Leave your suitcase in the car.  After surgery it may be brought to your room.  For patients admitted to the hospital, discharge time will be determined by your treatment team.  Patients discharged the day of surgery will not be allowed to drive home and must have someone with them for 24 hours.    Special instructions:   DO NOT smoke tobacco or vape for 24 hours before your procedure.  Please read over the following fact sheets that you were given. Anesthesia Post-op Instructions and Care and Recovery After Surgery      Colonoscopy, Adult, Care After The following information offers guidance on how to care for yourself after your procedure. Your health care provider may also give you more specific instructions. If you have problems or questions, contact your health care provider. What can I expect after the procedure? After the procedure, it is common to have: A  small amount of blood in your stool for 24 hours after the procedure. Some gas. Mild cramping or bloating of your abdomen. Follow these instructions at home: Eating and drinking  Drink enough fluid to keep your urine pale yellow. Follow instructions from your health care provider about eating or drinking restrictions. Resume your normal diet as told by your health care provider. Avoid heavy or fried foods that are hard to digest. Activity Rest as told by your health care provider. Avoid sitting for a long time without moving. Get up to take short walks every 1-2 hours. This is important to improve blood flow and breathing. Ask for help if you feel weak or unsteady. Return to your normal activities as told by your health care provider. Ask your health care provider what activities are safe for you. Managing cramping and bloating  Try walking around when you have cramps or feel bloated. If directed, apply heat to your abdomen as told by your health care provider. Use the heat source that your health care provider recommends, such as a moist heat pack or a heating pad. Place a towel between your skin and the heat source. Leave the heat on for 20-30 minutes. Remove the heat if your skin turns bright red. This is especially important if  you are unable to feel pain, heat, or cold. You have a greater risk of getting burned. General instructions If you were given a sedative during the procedure, it can affect you for several hours. Do not drive or operate machinery until your health care provider says that it is safe. For the first 24 hours after the procedure: Do not sign important documents. Do not drink alcohol. Do your regular daily activities at a slower pace than normal. Eat soft foods that are easy to digest. Take over-the-counter and prescription medicines only as told by your health care provider. Keep all follow-up visits. This is important. Contact a health care provider if: You have  blood in your stool 2-3 days after the procedure. Get help right away if: You have more than a small spotting of blood in your stool. You have large blood clots in your stool. You have swelling of your abdomen. You have nausea or vomiting. You have a fever. You have increasing pain in your abdomen that is not relieved with medicine. These symptoms may be an emergency. Get help right away. Call 911. Do not wait to see if the symptoms will go away. Do not drive yourself to the hospital. Summary After the procedure, it is common to have a small amount of blood in your stool. You may also have mild cramping and bloating of your abdomen. If you were given a sedative during the procedure, it can affect you for several hours. Do not drive or operate machinery until your health care provider says that it is safe. Get help right away if you have a lot of blood in your stool, nausea or vomiting, a fever, or increased pain in your abdomen. This information is not intended to replace advice given to you by your health care provider. Make sure you discuss any questions you have with your health care provider. Document Revised: 08/04/2021 Document Reviewed: 08/04/2021 Elsevier Patient Education  [place] After The following information offers guidance on how to care for yourself after your procedure. Your health care provider may also give you more specific instructions. If you have problems or questions, contact your health care provider. What can I expect after the procedure? After the procedure, it is common to have: Tiredness. Little or no memory about what happened during or after the procedure. Impaired judgment when it comes to making decisions. Nausea or vomiting. Some trouble with balance. Follow these instructions at home: For the time period you were told by your health care provider:  Rest. Do not participate in activities where you could fall  or become injured. Do not drive or use machinery. Do not drink alcohol. Do not take sleeping pills or medicines that cause drowsiness. Do not make important decisions or sign legal documents. Do not take care of children on your own. Medicines Take over-the-counter and prescription medicines only as told by your health care provider. If you were prescribed antibiotics, take them as told by your health care provider. Do not stop using the antibiotic even if you start to feel better. Eating and drinking Follow instructions from your health care provider about what you may eat and drink. Drink enough fluid to keep your urine pale yellow. If you vomit: Drink clear fluids slowly and in small amounts as you are able. Clear fluids include water, ice chips, low-calorie sports drinks, and fruit juice that has water added to it (diluted fruit juice). Eat light and bland foods in small amounts  as you are able. These foods include bananas, applesauce, rice, lean meats, toast, and crackers. General instructions  Have a responsible adult stay with you for the time you are told. It is important to have someone help care for you until you are awake and alert. If you have sleep apnea, surgery and some medicines can increase your risk for breathing problems. Follow instructions from your health care provider about wearing your sleep device: When you are sleeping. This includes during daytime naps. While taking prescription pain medicines, sleeping medicines, or medicines that make you drowsy. Do not use any products that contain nicotine or tobacco. These products include cigarettes, chewing tobacco, and vaping devices, such as e-cigarettes. If you need help quitting, ask your health care provider. Contact a health care provider if: You feel nauseous or vomit every time you eat or drink. You feel light-headed. You are still sleepy or having trouble with balance after 24 hours. You get a rash. You have a  fever. You have redness or swelling around the IV site. Get help right away if: You have trouble breathing. You have new confusion after you get home. These symptoms may be an emergency. Get help right away. Call 911. Do not wait to see if the symptoms will go away. Do not drive yourself to the hospital. This information is not intended to replace advice given to you by your health care provider. Make sure you discuss any questions you have with your health care provider. Document Revised: 05/09/2022 Document Reviewed: 05/09/2022 Elsevier Patient Education  [place].

## 2022-11-07 ENCOUNTER — Encounter (HOSPITAL_COMMUNITY)
Admission: RE | Admit: 2022-11-07 | Discharge: 2022-11-07 | Disposition: A | Discharge status: Home / Self Care | Payer: Medicare Other | Source: Ambulatory Visit | Attending: Internal Medicine | Admitting: Internal Medicine

## 2022-11-07 ENCOUNTER — Encounter (HOSPITAL_COMMUNITY): Payer: Self-pay

## 2022-11-07 ENCOUNTER — Other Ambulatory Visit: Payer: Self-pay

## 2022-11-07 DIAGNOSIS — E119 Type 2 diabetes mellitus without complications: Secondary | ICD-10-CM | POA: Insufficient documentation

## 2022-11-07 DIAGNOSIS — Z01818 Encounter for other preprocedural examination: Secondary | ICD-10-CM | POA: Diagnosis not present

## 2022-11-07 LAB — BASIC METABOLIC PANEL
Anion gap: 5 (ref 5–15)
BUN: 13 mg/dL (ref 8–23)
CO2: 30 mmol/L (ref 22–32)
Calcium: 9.2 mg/dL (ref 8.9–10.3)
Chloride: 104 mmol/L (ref 98–111)
Creatinine, Ser: 0.84 mg/dL (ref 0.44–1.00)
GFR, Estimated: >60 mL/min (ref >60)
Glucose, Bld: 101 mg/dL — ABNORMAL HIGH (ref 70–99)
Potassium: 4.4 mmol/L (ref 3.5–5.1)
Sodium: 139 mmol/L (ref 135–145)

## 2022-11-09 ENCOUNTER — Encounter (HOSPITAL_COMMUNITY): Payer: Self-pay

## 2022-11-09 ENCOUNTER — Ambulatory Visit (HOSPITAL_COMMUNITY): Admit: 2022-11-09 | Payer: Medicare Other | Admitting: Internal Medicine

## 2022-11-09 ENCOUNTER — Encounter (HOSPITAL_COMMUNITY): Payer: Self-pay | Admitting: Internal Medicine

## 2022-11-09 ENCOUNTER — Other Ambulatory Visit: Payer: Self-pay

## 2022-11-09 ENCOUNTER — Ambulatory Visit (HOSPITAL_BASED_OUTPATIENT_CLINIC_OR_DEPARTMENT_OTHER): Payer: Medicare Other | Admitting: Anesthesiology

## 2022-11-09 ENCOUNTER — Ambulatory Visit (HOSPITAL_COMMUNITY): Payer: Medicare Other | Admitting: Anesthesiology

## 2022-11-09 ENCOUNTER — Encounter (HOSPITAL_COMMUNITY)
Admission: RE | Disposition: A | Discharge status: Home / Self Care | Payer: Self-pay | Source: Home / Self Care | Attending: Internal Medicine

## 2022-11-09 ENCOUNTER — Ambulatory Visit (HOSPITAL_COMMUNITY)
Admission: RE | Admit: 2022-11-09 | Discharge: 2022-11-09 | Disposition: A | Discharge status: Home / Self Care | Payer: Medicare Other | Attending: Internal Medicine | Admitting: Internal Medicine

## 2022-11-09 DIAGNOSIS — I10 Essential (primary) hypertension: Secondary | ICD-10-CM | POA: Insufficient documentation

## 2022-11-09 DIAGNOSIS — Z87891 Personal history of nicotine dependence: Secondary | ICD-10-CM | POA: Insufficient documentation

## 2022-11-09 DIAGNOSIS — Z86010 Personal history of colonic polyps: Secondary | ICD-10-CM | POA: Diagnosis not present

## 2022-11-09 DIAGNOSIS — D122 Benign neoplasm of ascending colon: Secondary | ICD-10-CM

## 2022-11-09 DIAGNOSIS — K635 Polyp of colon: Secondary | ICD-10-CM | POA: Diagnosis not present

## 2022-11-09 DIAGNOSIS — I2510 Atherosclerotic heart disease of native coronary artery without angina pectoris: Secondary | ICD-10-CM

## 2022-11-09 DIAGNOSIS — Z1211 Encounter for screening for malignant neoplasm of colon: Secondary | ICD-10-CM | POA: Diagnosis not present

## 2022-11-09 DIAGNOSIS — Z09 Encounter for follow-up examination after completed treatment for conditions other than malignant neoplasm: Secondary | ICD-10-CM

## 2022-11-09 DIAGNOSIS — E119 Type 2 diabetes mellitus without complications: Secondary | ICD-10-CM | POA: Diagnosis not present

## 2022-11-09 DIAGNOSIS — E039 Hypothyroidism, unspecified: Secondary | ICD-10-CM | POA: Insufficient documentation

## 2022-11-09 DIAGNOSIS — Z951 Presence of aortocoronary bypass graft: Secondary | ICD-10-CM | POA: Insufficient documentation

## 2022-11-09 HISTORY — PX: COLONOSCOPY WITH PROPOFOL: SHX5780

## 2022-11-09 HISTORY — PX: POLYPECTOMY: SHX5525

## 2022-11-09 LAB — GLUCOSE, CAPILLARY: Glucose-Capillary: 82 mg/dL (ref 70–99)

## 2022-11-09 SURGERY — ESOPHAGOGASTRODUODENOSCOPY (EGD) WITH PROPOFOL
Anesthesia: Monitor Anesthesia Care

## 2022-11-09 SURGERY — COLONOSCOPY WITH PROPOFOL
Anesthesia: General

## 2022-11-09 MED ORDER — PROPOFOL 500 MG/50ML IV EMUL
INTRAVENOUS | Status: DC | PRN
  Administered 2022-11-09: 150 ug/kg/min via INTRAVENOUS

## 2022-11-09 MED ORDER — PROPOFOL 10 MG/ML IV BOLUS
INTRAVENOUS | Status: DC | PRN
  Administered 2022-11-09: 50 mg via INTRAVENOUS

## 2022-11-09 MED ORDER — LACTATED RINGERS IV SOLN: INTRAVENOUS | Status: DC | PRN

## 2022-11-09 MED ORDER — ONDANSETRON HCL 4 MG/2ML IJ SOLN
INTRAMUSCULAR | Status: DC | PRN
  Administered 2022-11-09: 4 mg via INTRAVENOUS

## 2022-11-09 MED ORDER — EPHEDRINE SULFATE-NACL 50-0.9 MG/10ML-% IV SOSY
PREFILLED_SYRINGE | INTRAVENOUS | Status: DC | PRN
  Administered 2022-11-09: 5 mg via INTRAVENOUS

## 2022-11-09 NOTE — Anesthesia Preprocedure Evaluation (Signed)
Anesthesia Evaluation  Patient identified by MRN, date of birth, ID band Patient awake    Reviewed: Allergy & Precautions, H&P , NPO status , Patient's Chart, lab work & pertinent test results, reviewed documented beta blocker date and time   History of Anesthesia Complications (+) PONV and history of anesthetic complications  Airway Mallampati: II  TM Distance: >3 FB Neck ROM: full    Dental no notable dental hx.    Pulmonary neg pulmonary ROS, former smoker   Pulmonary exam normal breath sounds clear to auscultation       Cardiovascular Exercise Tolerance: Good hypertension, + CAD and + CABG (x3 2020)   Rhythm:regular Rate:Normal     Neuro/Psych  Headaches  negative psych ROS   GI/Hepatic negative GI ROS, Neg liver ROS,,,  Endo/Other  diabetesHypothyroidism    Renal/GU negative Renal ROS  negative genitourinary   Musculoskeletal   Abdominal   Peds  Hematology negative hematology ROS (+)   Anesthesia Other Findings   Reproductive/Obstetrics negative OB ROS                             Anesthesia Physical Anesthesia Plan  ASA: 3  Anesthesia Plan: General   Post-op Pain Management:    Induction:   PONV Risk Score and Plan: Ondansetron  Airway Management Planned:   Additional Equipment:   Intra-op Plan:   Post-operative Plan:   Informed Consent: I have reviewed the patients History and Physical, chart, labs and discussed the procedure including the risks, benefits and alternatives for the proposed anesthesia with the patient or authorized representative who has indicated his/her understanding and acceptance.     Dental Advisory Given  Plan Discussed with: CRNA  Anesthesia Plan Comments:         Anesthesia Quick Evaluation

## 2022-11-09 NOTE — H&P (Signed)
$'@LOGO'o$ @   Primary Care Physician:  Rory Percy, MD Primary Gastroenterologist:  Dr. Gala Romney  Pre-Procedure History & Physical: HPI:  Kathleen Terrell is a 76 y.o. female here for surveillance colonoscopy.  History of multiple colonic adenomas removed over time.  No bowel symptoms currently.  History of diverticulosis and diverticulitis.  Past Medical History:  Diagnosis Date   Abnormal stress test    Acute serous otitis media    LEFT EAR   Arthritis    Atopic eczema    DERMATITIS   CAD (coronary artery disease)    Cataracts, bilateral    Colon adenomas    Diverticulitis 11/24/2011   DM (diabetes mellitus) ([place])    patient denies, not on medications, diet controlled   Headache    Heme + stool 03/22/2017   History of colonic polyps 03/22/2017   HTN (hypertension)    Hyperlipidemia    Hypothyroidism    Impacted cerumen of left ear    Low back pain    Melena    PONV (postoperative nausea and vomiting)    Spondylolisthesis of lumbar region 09/06/2016   Syncope     Past Surgical History:  Procedure Laterality Date   BACK SURGERY  09/05/2016   L4 and L5    COLONOSCOPY  [facility]   >5 YEARS AGO   COLONOSCOPY  12/12/2011   Procedure: COLONOSCOPY;  Surgeon: Dorothyann Peng, MD;  [facility];  Service: Endoscopy;  Laterality: N/A;  9:00   COLONOSCOPY N/A 04/14/2017   Procedure: COLONOSCOPY;  Surgeon: Danie Binder, MD;  [facility];  Service: Endoscopy;  Laterality: N/A;  1030 - per office, pt can't come earlier    [place] N/A 06/18/2019   Procedure: CORONARY ARTERY BYPASS GRAFTING (CABG) x 3, ON PUMP, LIMA TO LAD, SVG TO RCA, SVG TO OM 1, USING LEFT INTERNAL MAMMARY ARTERY AND RIGHT GREATER SAPHENOUS VEIN HARVESTED ENDOSCOPICALLY;  Surgeon: Grace Isaac, MD;  Location: [place] [facility];  Service: Open Heart Surgery;  Laterality: N/A;   cyst removed     under the left arm   DILATION AND CURETTAGE OF UTERUS     LEFT HEART CATH AND CORONARY  ANGIOGRAPHY N/A 06/14/2019   Procedure: LEFT HEART CATH AND CORONARY ANGIOGRAPHY;  Surgeon: Jettie Booze, MD;  Location: [facility];  Service: Cardiovascular;  Laterality: N/A;   POLYPECTOMY  04/14/2017   Procedure: POLYPECTOMY;  Surgeon: Danie Binder, MD;  [facility];  Service: Endoscopy;;  ascending, hepatic flexure, prox. transverse, sigmoid x3   SALPINGOOPHORECTOMY     left   TEE WITHOUT CARDIOVERSION N/A 06/18/2019   Procedure: TRANSESOPHAGEAL ECHOCARDIOGRAM (TEE);  Surgeon: Grace Isaac, MD;  Location: [place];  Service: Open Heart Surgery;  Laterality: N/A;   TUBAL LIGATION     tubual pregnancy      Prior to Admission medications   Medication Sig Start Date End Date Taking? Authorizing Provider  acetaminophen (TYLENOL) 500 MG tablet Take 500 mg by mouth every 6 (six) hours as needed (PAIN.).   Yes [provider]  aspirin EC 81 MG tablet Take 81 mg by mouth in the morning. Swallow whole.   Yes [provider]  atorvastatin (LIPITOR) 40 MG tablet Take 1 tablet (40 mg total) by mouth daily. Patient taking differently: Take 40 mg by mouth every evening. 08/25/22  Yes Jettie Booze, MD  Calcium Carb-Cholecalciferol (CALCIUM + D3 PO) Take 1 tablet by mouth  in the morning and at bedtime.   Yes [provider]  chlorthalidone (HYGROTON) 25 MG tablet TAKE 1 TABLET BY MOUTH DAILY 08/25/22  Yes Jettie Booze, MD  Cholecalciferol (VITAMIN D3 PO) Take 1 tablet by mouth in the morning.   Yes [provider]  levothyroxine (SYNTHROID) 75 MCG tablet Take 75 mcg by mouth daily before breakfast.   Yes [provider]  lisinopril (ZESTRIL) 20 MG tablet Take 1 tablet (20 mg total) by mouth daily. 08/25/22  Yes Jettie Booze, MD  metoprolol tartrate (LOPRESSOR) 25 MG tablet Take 1 tablet (25 mg total) by mouth 2 (two) times daily. 08/11/22  Yes Jettie Booze, MD  Misc Natural Products (TART CHERRY ADVANCED  PO) Take 1 capsule by mouth daily.   Yes [provider]  NON FORMULARY Take 1,000 mg by mouth in the morning. BEET ROOT SUPPLEMENT: 1000 MG DAILY   Yes [provider]  Probiotic Product (PROBIOTIC FORMULA PO) Take 1 capsule by mouth daily in the afternoon.   Yes [provider]  TURMERIC CURCUMIN PO Take 1 capsule by mouth in the morning.   Yes [provider]    Allergies as of 09/28/2022 - Review Complete 09/19/2022  Allergen Reaction Noted   Methionine Other (See Comments) 04/15/2011   Darvocet [propoxyphene n-acetaminophen] Nausea Only and Other (See Comments) 12/12/2011   Latex Rash 08/23/2016   Ultram [place] Mode hcl] Nausea And Vomiting 05/27/2019    Family History  Problem Relation Age of Onset   Heart attack Mother    Hypertension Mother    Thyroid disease Mother    CAD Mother    Diabetes Mellitus II Mother    Kidney disease Brother    Stroke Brother    Heart attack Brother    Hypertension Daughter    Colon cancer Neg Hx    Colon polyps Neg Hx     Social History   Socioeconomic History   Marital status: Widowed    Spouse name: Not on file   Number of children: Not on file   Years of education: Not on file   Highest education level: Not on file  Occupational History   Occupation: HAIR DRESSER- [employer]  Tobacco Use   Smoking status: Former    Packs/day: 1.00    Years: 19.00    Total pack years: 19.00    Types: Cigarettes    Quit date: 03/09/1984    Years since quitting: 38.6   Smokeless tobacco: Never  Vaping Use   Vaping Use: Never used  Substance and Sexual Activity   Alcohol use: No   Drug use: No   Sexual activity: Not on file  Other Topics Concern   Not on file  Social History Narrative   Used to work for [employer] 76 yo   Social Determinants of Health   Financial Resource Strain: Not on file  Food Insecurity: Not on file  Transportation Needs: Not on file  Physical  Activity: Not on file  Stress: Not on file  Social Connections: Not on file  Intimate Partner Violence: Not on file    Review of Systems: See HPI, otherwise negative ROS  Physical Exam: BP (!) 147/62   Pulse 63   Temp 98 F (36.7 C) (Oral)   Resp 16   Ht '5\' 3"'$  (1.6 m)   Wt 79.4 kg   SpO2 99%   BMI 31.00 kg/m  General:   Alert,  Well-developed, well-nourished, pleasant  and cooperative in NAD Neck:  Supple; no masses or thyromegaly. No significant cervical adenopathy. Lungs:  Clear throughout to auscultation.   No wheezes, crackles, or rhonchi. No acute distress. Heart:  Regular rate and rhythm; no murmurs, clicks, rubs,  or gallops. Abdomen: Non-distended, normal bowel sounds.  Soft and nontender without appreciable mass or hepatosplenomegaly.  Pulses:  Normal pulses noted. Extremities:  Without clubbing or edema.  Impression/Plan: 76 year old lady with multiple colonic polyps (adenomas) removed over time-multiple colonoscopies.  No bowel symptoms at present.  I have offered the patient a surveillance colonoscopy today per plan. The risks, benefits, limitations, alternatives and imponderables have been reviewed with the patient. Questions have been answered. All parties are agreeable.       Notice: This dictation was prepared with Dragon dictation along with smaller phrase technology. Any transcriptional errors that result from this process are unintentional and may not be corrected upon review.

## 2022-11-09 NOTE — Anesthesia Postprocedure Evaluation (Signed)
Anesthesia Post Note  Patient: Kathleen Terrell  Procedure(s) Performed: COLONOSCOPY WITH PROPOFOL POLYPECTOMY  Patient location during evaluation: Phase II Anesthesia Type: General Level of consciousness: awake and alert Pain management: pain level controlled Vital Signs Assessment: post-procedure vital signs reviewed and stable Respiratory status: spontaneous breathing, nonlabored ventilation, respiratory function stable and patient connected to nasal cannula oxygen Cardiovascular status: blood pressure returned to baseline and stable Postop Assessment: no apparent nausea or vomiting Anesthetic complications: no   No notable events documented.   Last Vitals:  Vitals:   11/09/22 0909 11/09/22 0912  BP: (!) 92/40 (!) 95/59  Pulse: 65   Resp: 16   Temp: 36.6 C   SpO2:  94%    Last Pain:  Vitals:   11/09/22 0909  TempSrc: Oral  PainSc: 0-No pain                 Buffey Zabinski [name]

## 2022-11-09 NOTE — Discharge Instructions (Signed)
  Colonoscopy Discharge Instructions  Read the instructions outlined below and refer to this sheet in the next few weeks. These discharge instructions provide you with general information on caring for yourself after you leave the hospital. Your doctor may also give you specific instructions. While your treatment has been planned according to the most current medical practices available, unavoidable complications occasionally occur. If you have any problems or questions after discharge, call Dr. Gala Romney at [phone]. ACTIVITY You may resume your regular activity, but move at a slower pace for the next 24 hours.  Take frequent rest periods for the next 24 hours.  Walking will help get rid of the air and reduce the bloated feeling in your belly (abdomen).  No driving for 24 hours (because of the medicine (anesthesia) used during the test).   Do not sign any important legal documents or operate any machinery for 24 hours (because of the anesthesia used during the test).  NUTRITION Drink plenty of fluids.  You may resume your normal diet as instructed by your doctor.  Begin with a light meal and progress to your normal diet. Heavy or fried foods are harder to digest and may make you feel sick to your stomach (nauseated).  Avoid alcoholic beverages for 24 hours or as instructed.  MEDICATIONS You may resume your normal medications unless your doctor tells you otherwise.  WHAT YOU CAN EXPECT TODAY Some feelings of bloating in the abdomen.  Passage of more gas than usual.  Spotting of blood in your stool or on the toilet paper.  IF YOU HAD POLYPS REMOVED DURING THE COLONOSCOPY: No aspirin products for 7 days or as instructed.  No alcohol for 7 days or as instructed.  Eat a soft diet for the next 24 hours.  FINDING OUT THE RESULTS OF YOUR TEST Not all test results are available during your visit. If your test results are not back during the visit, make an appointment with your caregiver to find out the  results. Do not assume everything is normal if you have not heard from your caregiver or the medical facility. It is important for you to follow up on all of your test results.  SEEK IMMEDIATE MEDICAL ATTENTION IF: You have more than a spotting of blood in your stool.  Your belly is swollen (abdominal distention).  You are nauseated or vomiting.  You have a temperature over 101.  You have abdominal pain or discomfort that is severe or gets worse throughout the day.    1 small polyp removed from your colon today  Colon polyp and diverticulosis information provided  Further recommendations to follow pending review of pathology report  At patient request, I called Kathleene Hazel at [phone] -reviewed findings and recommendations

## 2022-11-09 NOTE — Transfer of Care (Signed)
Immediate Anesthesia Transfer of Care Note  Patient: Kathleen Terrell  Procedure(s) Performed: COLONOSCOPY WITH PROPOFOL POLYPECTOMY  Patient Location: PACU  Anesthesia Type:General  Level of Consciousness: awake  Airway & Oxygen Therapy: Patient Spontanous Breathing  Post-op Assessment: Report given to RN and Post -op Vital signs reviewed and stable  Post vital signs: Reviewed and stable  Last Vitals:  Vitals Value Taken Time  BP    Temp    Pulse    Resp    SpO2      Last Pain:  Vitals:   11/09/22 0845  TempSrc:   PainSc: 0-No pain         Complications: No notable events documented.

## 2022-11-09 NOTE — Op Note (Signed)
[facility] Patient Name: Kathleen Terrell Procedure Date: 11/09/2022 8:30 AM MRN: [id number] Date of Birth: [birth year] Attending MD: Norvel Richards , MD, [id number] CSN: [id number] Age: 76 Admit Type: Outpatient Procedure:                Colonoscopy Indications:              High risk colon cancer surveillance: Personal                            history of colonic polyps Providers:                Norvel Richards, MD, Caprice Kluver, Ladoris Gene                            Technician, Technician Referring MD:              Medicines:                Propofol per Anesthesia Complications:            No immediate complications. Estimated Blood Loss:     Estimated blood loss was minimal. Estimated blood                            loss was minimal. Procedure:                Pre-Anesthesia Assessment:                           - Prior to the procedure, a History and Physical                            was performed, and patient medications and                            allergies were reviewed. The patient's tolerance of                            previous anesthesia was also reviewed. The risks                            and benefits of the procedure and the sedation                            options and risks were discussed with the patient.                            All questions were answered, and informed consent                            was obtained. Prior Anticoagulants: The patient has                            taken no anticoagulant or antiplatelet agents. ASA  Grade Assessment: III - A patient with severe                            systemic disease. After reviewing the risks and                            benefits, the patient was deemed in satisfactory                            condition to undergo the procedure.                           After obtaining informed consent, the colonoscope                            was passed under direct  vision. Throughout the                            procedure, the patient's blood pressure, pulse, and                            oxygen saturations were monitored continuously. The                            [id number] scope was introduced through the                            anus and advanced to the the cecum, identified by                            appendiceal orifice and ileocecal valve. The                            ileocecal valve, appendiceal orifice, and rectum                            were photographed. Scope In: 8:50:28 AM Scope Out: 9:01:30 AM Scope Withdrawal Time: 0 hours 7 minutes 3 seconds  Total Procedure Duration: 0 hours 11 minutes 2 seconds  Findings:      The perianal and digital rectal examinations were normal.      A polyp was found in the ascending colon. The polyp was sessile (3 mm).       The polyp was removed with a cold snare. Resection and retrieval were       complete. Estimated blood loss was minimal.      The exam was otherwise without abnormality on direct and retroflexion       views. Impression:               - One polyp in the ascending colon, removed with a                            cold snare. Resected and retrieved.                           -  The examination was otherwise normal on direct                            and retroflexion views. Moderate Sedation:      Moderate (conscious) sedation was personally administered by an       anesthesia professional. The following parameters were monitored: oxygen       saturation, heart rate, blood pressure, respiratory rate, EKG, adequacy       of pulmonary ventilation, and response to care. Recommendation:           - Patient has a contact number available for                            emergencies. The signs and symptoms of potential                            delayed complications were discussed with the                            patient. Return to normal activities tomorrow.                             Written discharge instructions were provided to the                            patient.                           - Resume previous diet.                           - Continue present medications.                           - Repeat colonoscopy date to be determined after                            pending pathology results are reviewed for                            surveillance.                           - Return to GI office (date not yet determined). Procedure Code(s):        --- Professional ---                           [phone], Colonoscopy, flexible; with removal of                            tumor(s), polyp(s), or other lesion(s) by snare                            technique Diagnosis Code(s):        --- Professional ---  Z86.010, Personal history of colonic polyps                           D12.2, Benign neoplasm of ascending colon CPT copyright 2022 [employer]. All rights reserved. The codes documented in this report are preliminary and upon coder review may  be revised to meet current compliance requirements. Cristopher Estimable. Patrik Turnbaugh, MD Norvel Richards, MD 11/09/2022 9:10:06 AM This report has been signed electronically. Number of Addenda: 0

## 2022-11-11 ENCOUNTER — Encounter: Payer: Self-pay | Admitting: Internal Medicine

## 2022-11-11 LAB — SURGICAL PATHOLOGY

## 2022-11-15 ENCOUNTER — Encounter (HOSPITAL_COMMUNITY): Payer: Self-pay | Admitting: Internal Medicine

## 2022-11-25 DIAGNOSIS — R030 Elevated blood-pressure reading, without diagnosis of hypertension: Secondary | ICD-10-CM | POA: Diagnosis not present

## 2022-11-25 DIAGNOSIS — J019 Acute sinusitis, unspecified: Secondary | ICD-10-CM | POA: Diagnosis not present

## 2022-11-25 DIAGNOSIS — H6122 Impacted cerumen, left ear: Secondary | ICD-10-CM | POA: Diagnosis not present

## 2022-11-28 DIAGNOSIS — I10 Essential (primary) hypertension: Secondary | ICD-10-CM | POA: Diagnosis not present

## 2022-11-28 DIAGNOSIS — E7849 Other hyperlipidemia: Secondary | ICD-10-CM | POA: Diagnosis not present

## 2022-11-28 DIAGNOSIS — E039 Hypothyroidism, unspecified: Secondary | ICD-10-CM | POA: Diagnosis not present

## 2022-11-28 DIAGNOSIS — E119 Type 2 diabetes mellitus without complications: Secondary | ICD-10-CM | POA: Diagnosis not present

## 2022-12-01 DIAGNOSIS — J0101 Acute recurrent maxillary sinusitis: Secondary | ICD-10-CM | POA: Diagnosis not present

## 2022-12-01 DIAGNOSIS — E039 Hypothyroidism, unspecified: Secondary | ICD-10-CM | POA: Diagnosis not present

## 2022-12-01 DIAGNOSIS — I10 Essential (primary) hypertension: Secondary | ICD-10-CM | POA: Diagnosis not present

## 2022-12-01 DIAGNOSIS — E119 Type 2 diabetes mellitus without complications: Secondary | ICD-10-CM | POA: Diagnosis not present

## 2022-12-01 DIAGNOSIS — E7849 Other hyperlipidemia: Secondary | ICD-10-CM | POA: Diagnosis not present

## 2022-12-01 DIAGNOSIS — Z1389 Encounter for screening for other disorder: Secondary | ICD-10-CM | POA: Diagnosis not present

## 2022-12-14 DIAGNOSIS — H35033 Hypertensive retinopathy, bilateral: Secondary | ICD-10-CM | POA: Diagnosis not present

## 2022-12-14 DIAGNOSIS — H04123 Dry eye syndrome of bilateral lacrimal glands: Secondary | ICD-10-CM | POA: Diagnosis not present

## 2022-12-14 DIAGNOSIS — Z961 Presence of intraocular lens: Secondary | ICD-10-CM | POA: Diagnosis not present

## 2023-02-14 NOTE — Progress Notes (Signed)
Cardiology Office Note   Date:  02/15/2023   ID:  Kathleen, Terrell [birth year], MRN [id number]  PCP:  Rory Percy, MD    No chief complaint on file.  CAD  Wt Readings from Last 3 Encounters:  02/15/23 169 lb 6.4 oz (76.8 kg)  11/09/22 175 lb (79.4 kg)  08/22/22 176 lb 3.2 oz (79.9 kg)       History of Present Illness: ADELIN Terrell is a 77 y.o. female  with CAD.  In 2020, abnormal coronary CTA which showed significant CAD.  Echo with normal LVEF, no significant valvular abnormalities. She was then referred for cardiac catheterization which revealed severe three-vessel disease and she ultimately underwent CABG surgery by Dr. Servando Snare and Dr. Kipp Brood on June 18, 2019.  She did fairly well postoperatively.  No postoperative atrial fibrillation.  LIMA to LAD, SVG to OM, SVG to RCA.    She has finished cardiac rehab in 2020.   She got her [place] vaccines.  Got second booster.   In summer 2023, she was standing in line at [employer] and she passed out. She recovered after a minute.  ECG was ok.  BP was a little low.  SHe had some nausea. She had been having some diarrhea prior to this and had to go while in line, but was holding it.  She had some lightheadedness.    She went to ER in [facility]: "Cerebral atrophy. Microvascular ischemic changes in the periventricular white matter. No evidence of intracranial hemorrhage."  Denies : Chest pain. Dizziness. Leg edema. Nitroglycerin use. Orthopnea. Palpitations. Paroxysmal nocturnal dyspnea. Shortness of breath. Syncope.    No further syncope.   Trip to [place] in Feb 2024.  No problems.   Still doing the treadmill.  15-20 minutes on the treadmill.      Past Medical History:  Diagnosis Date   Abnormal stress test    Acute serous otitis media    LEFT EAR   Arthritis    Atopic eczema    DERMATITIS   CAD (coronary artery disease)    Cataracts, bilateral    Colon adenomas    Diverticulitis 11/24/2011   DM (diabetes  mellitus) ([place])    patient denies, not on medications, diet controlled   Headache    Heme + stool 03/22/2017   History of colonic polyps 03/22/2017   HTN (hypertension)    Hyperlipidemia    Hypothyroidism    Impacted cerumen of left ear    Low back pain    Melena    PONV (postoperative nausea and vomiting)    Spondylolisthesis of lumbar region 09/06/2016   Syncope     Past Surgical History:  Procedure Laterality Date   BACK SURGERY  09/05/2016   L4 and L5    COLONOSCOPY  [facility]   >5 YEARS AGO   COLONOSCOPY  12/12/2011   Procedure: COLONOSCOPY;  Surgeon: Dorothyann Peng, MD;  [facility];  Service: Endoscopy;  Laterality: N/A;  9:00   COLONOSCOPY N/A 04/14/2017   Procedure: COLONOSCOPY;  Surgeon: Danie Binder, MD;  [facility];  Service: Endoscopy;  Laterality: N/A;  1030 - per office, pt can't come earlier    COLONOSCOPY WITH PROPOFOL N/A 11/09/2022   Procedure: COLONOSCOPY WITH PROPOFOL;  Surgeon: Daneil Dolin, MD;  [facility];  Service: Endoscopy;  Laterality: N/A;  10:45am, asa 3, moved up to 8:15 per Tammy   CORONARY ARTERY BYPASS GRAFT N/A 06/18/2019  Procedure: CORONARY ARTERY BYPASS GRAFTING (CABG) x 3, ON PUMP, LIMA TO LAD, SVG TO RCA, SVG TO OM 1, USING LEFT INTERNAL MAMMARY ARTERY AND RIGHT GREATER SAPHENOUS VEIN HARVESTED ENDOSCOPICALLY;  Surgeon: Grace Isaac, MD;  Location: [facility];  Service: Open Heart Surgery;  Laterality: N/A;   cyst removed     under the left arm   DILATION AND CURETTAGE OF UTERUS     LEFT HEART CATH AND CORONARY ANGIOGRAPHY N/A 06/14/2019   Procedure: LEFT HEART CATH AND CORONARY ANGIOGRAPHY;  Surgeon: Jettie Booze, MD;  Location: [facility];  Service: Cardiovascular;  Laterality: N/A;   POLYPECTOMY  04/14/2017   Procedure: POLYPECTOMY;  Surgeon: Danie Binder, MD;  [facility];  Service: Endoscopy;;  ascending, hepatic flexure, prox. transverse, sigmoid x3   POLYPECTOMY   11/09/2022   Procedure: POLYPECTOMY;  Surgeon: Daneil Dolin, MD;  [facility];  Service: Endoscopy;;   SALPINGOOPHORECTOMY     left   TEE WITHOUT CARDIOVERSION N/A 06/18/2019   Procedure: TRANSESOPHAGEAL ECHOCARDIOGRAM (TEE);  Surgeon: Grace Isaac, MD;  Location: [facility];  Service: Open Heart Surgery;  Laterality: N/A;   TUBAL LIGATION     tubual pregnancy       Current Outpatient Medications  Medication Sig Dispense Refill   acetaminophen (TYLENOL) 500 MG tablet Take 500 mg by mouth every 6 (six) hours as needed (PAIN.).     aspirin EC 81 MG tablet Take 81 mg by mouth in the morning. Swallow whole.     atorvastatin (LIPITOR) 40 MG tablet Take 1 tablet (40 mg total) by mouth daily. (Patient taking differently: Take 40 mg by mouth every evening.) 90 tablet 3   Calcium Carb-Cholecalciferol (CALCIUM + D3 PO) Take 1 tablet by mouth in the morning and at bedtime.     chlorthalidone (HYGROTON) 25 MG tablet TAKE 1 TABLET BY MOUTH DAILY 90 tablet 3   Cholecalciferol (VITAMIN D3 PO) Take 1 tablet by mouth in the morning.     levothyroxine (SYNTHROID) 75 MCG tablet Take 75 mcg by mouth daily before breakfast.     lisinopril (ZESTRIL) 20 MG tablet Take 1 tablet (20 mg total) by mouth daily. 90 tablet 3   metoprolol tartrate (LOPRESSOR) 25 MG tablet Take 1 tablet (25 mg total) by mouth 2 (two) times daily. 180 tablet 3   Misc Natural Products (TART CHERRY ADVANCED PO) Take 1 capsule by mouth daily.     NON FORMULARY Take 1,000 mg by mouth in the morning. BEET ROOT SUPPLEMENT: 1000 MG DAILY     Probiotic Product (PROBIOTIC FORMULA PO) Take 1 capsule by mouth daily in the afternoon.     TURMERIC CURCUMIN PO Take 1 capsule by mouth in the morning.     No current facility-administered medications for this visit.    Allergies:   Methionine, Darvocet [propoxyphene n-acetaminophen], Latex, and Ultram [tramadol hcl]    Social History:  The patient  reports that she quit smoking about  38 years ago. Her smoking use included cigarettes. She has a 19.00 pack-year smoking history. She has never used smokeless tobacco. She reports that she does not drink alcohol and does not use drugs.   Family History:  The patient's family history includes CAD in her mother; Diabetes Mellitus II in her mother; Heart attack in her brother and mother; Hypertension in her daughter and mother; Kidney disease in her brother; Stroke in her brother; Thyroid disease in her mother.  ROS:  Please see the history of present illness.   Otherwise, review of systems are positive for not drinking enough water.   All other systems are reviewed and negative.    PHYSICAL EXAM: VS:  BP (!) 140/64   Pulse 60   Ht 5' 4"$  (1.626 m)   Wt 169 lb 6.4 oz (76.8 kg)   SpO2 99%   BMI 29.08 kg/m  , BMI Body mass index is 29.08 kg/m. GEN: Well nourished, well developed, in no acute distress HEENT: normal Neck: no JVD, carotid bruits, or masses Cardiac: RRR; no murmurs, rubs, or gallops,no edema  Respiratory:  clear to auscultation bilaterally, normal work of breathing GI: soft, nontender, nondistended, + BS MS: no deformity or atrophy, mild varicose veins Skin: warm and dry, no rash Neuro:  Strength and sensation are intact Psych: euthymic mood, full affect   EKG:   The ekg ordered 8/23 demonstrates sinus bradycardia, nonspecific ST changes   Recent Labs: 11/07/2022: BUN 13; Creatinine, Ser 0.84; Potassium 4.4; Sodium 139   Lipid Panel    Component Value Date/Time   CHOL 122 10/08/2019 0752   TRIG 79 10/08/2019 0752   HDL 48 10/08/2019 0752   CHOLHDL 2.5 10/08/2019 0752   LDLCALC 58 10/08/2019 0752     Other studies Reviewed: Additional studies/ records that were reviewed today with results demonstrating: labs reviewed.   ASSESSMENT AND PLAN:  CAD: No angina.  Try to eat a more plant-based diet.Increase fiber intake.Decrease red meat intake.  Continue aggressive secondary prevention.  We spoke  about lifestyle changes that can improve her overall health. HTN: Readings at home have been well controlled.  Check the blood pressure at home after relaxing for 10 minutes.  As long as readings are well-controlled, she can just check 2 or 3 times a month.  If she starts to have high readings, let us know and we could potentially adjust medications. DM: diet controlled.  A1C 6.7.  Increasing fiber intake will help lower the blood sugar as well. Hyperlipidemia: The current medical regimen is effective;  continue present plan and medications.    Current medicines are reviewed at length with the patient today.  The patient concerns regarding her medicines were addressed.  The following changes have been made:  No change  Labs/ tests ordered today include:  No orders of the defined types were placed in this encounter.   Recommend 150 minutes/week of aerobic exercise Low fat, low carb, high fiber diet recommended  Disposition:   FU in 1 year   Signed, Larae Grooms, MD  02/15/2023 9:19 AM    [facility] [address], [place], [place]  [address] Phone: [phone]; Fax: [phone]

## 2023-02-15 ENCOUNTER — Ambulatory Visit: Payer: Medicare Other | Attending: Interventional Cardiology | Admitting: Interventional Cardiology

## 2023-02-15 ENCOUNTER — Encounter: Payer: Self-pay | Admitting: Interventional Cardiology

## 2023-02-15 VITALS — BP 140/64 | HR 60 | Ht 64.0 in | Wt 169.4 lb

## 2023-02-15 DIAGNOSIS — I10 Essential (primary) hypertension: Secondary | ICD-10-CM

## 2023-02-15 DIAGNOSIS — I25118 Atherosclerotic heart disease of native coronary artery with other forms of angina pectoris: Secondary | ICD-10-CM

## 2023-02-15 DIAGNOSIS — Z951 Presence of aortocoronary bypass graft: Secondary | ICD-10-CM | POA: Diagnosis not present

## 2023-02-15 DIAGNOSIS — E782 Mixed hyperlipidemia: Secondary | ICD-10-CM

## 2023-02-15 NOTE — Patient Instructions (Addendum)
Medication Instructions:  Your physician recommends that you continue on your current medications as directed. Please refer to the Current Medication list given to you today.   *If you need a refill on your cardiac medications before your next appointment, please call your pharmacy*  Lab Work: If you have labs (blood work) drawn today and your tests are completely normal, you will receive your results only by: [place] (if you have MyChart) OR A paper copy in the mail If you have any lab test that is abnormal or we need to change your treatment, we will call you to review the results.  Testing/Procedures: None ordered today .  Follow-Up: At [facility], you and your health needs are our priority.  As part of our continuing mission to provide you with exceptional heart care, we have created designated Provider Care Teams.  These Care Teams include your primary Cardiologist (physician) and Advanced Practice Providers (APPs -  Physician Assistants and Nurse Practitioners) who all work together to provide you with the care you need, when you need it.  We recommend signing up for the patient portal called "MyChart".  Sign up information is provided on this After Visit Summary.  MyChart is used to connect with patients for Virtual Visits (Telemedicine).  Patients are able to view lab/test results, encounter notes, upcoming appointments, etc.  Non-urgent messages can be sent to your provider as well.   To learn more about what you can do with MyChart, go to [email or web address].    Your next appointment:   1 year(s)  Provider:   Larae Grooms, MD

## 2023-03-29 ENCOUNTER — Other Ambulatory Visit: Payer: Self-pay | Admitting: Interventional Cardiology

## 2023-05-23 DIAGNOSIS — E039 Hypothyroidism, unspecified: Secondary | ICD-10-CM | POA: Diagnosis not present

## 2023-05-23 DIAGNOSIS — E7849 Other hyperlipidemia: Secondary | ICD-10-CM | POA: Diagnosis not present

## 2023-05-23 DIAGNOSIS — E119 Type 2 diabetes mellitus without complications: Secondary | ICD-10-CM | POA: Diagnosis not present

## 2023-05-29 DIAGNOSIS — Z23 Encounter for immunization: Secondary | ICD-10-CM | POA: Diagnosis not present

## 2023-05-29 DIAGNOSIS — E039 Hypothyroidism, unspecified: Secondary | ICD-10-CM | POA: Diagnosis not present

## 2023-05-29 DIAGNOSIS — M545 Low back pain, unspecified: Secondary | ICD-10-CM | POA: Diagnosis not present

## 2023-05-29 DIAGNOSIS — I10 Essential (primary) hypertension: Secondary | ICD-10-CM | POA: Diagnosis not present

## 2023-05-29 DIAGNOSIS — E119 Type 2 diabetes mellitus without complications: Secondary | ICD-10-CM | POA: Diagnosis not present

## 2023-05-29 DIAGNOSIS — E7849 Other hyperlipidemia: Secondary | ICD-10-CM | POA: Diagnosis not present

## 2023-06-27 ENCOUNTER — Other Ambulatory Visit: Payer: Self-pay | Admitting: Interventional Cardiology

## 2023-07-13 DIAGNOSIS — Z1231 Encounter for screening mammogram for malignant neoplasm of breast: Secondary | ICD-10-CM | POA: Diagnosis not present

## 2023-11-20 DIAGNOSIS — E039 Hypothyroidism, unspecified: Secondary | ICD-10-CM | POA: Diagnosis not present

## 2023-11-20 DIAGNOSIS — I10 Essential (primary) hypertension: Secondary | ICD-10-CM | POA: Diagnosis not present

## 2023-11-20 DIAGNOSIS — I2510 Atherosclerotic heart disease of native coronary artery without angina pectoris: Secondary | ICD-10-CM | POA: Diagnosis not present

## 2023-11-20 DIAGNOSIS — E1165 Type 2 diabetes mellitus with hyperglycemia: Secondary | ICD-10-CM | POA: Diagnosis not present

## 2023-11-20 DIAGNOSIS — E785 Hyperlipidemia, unspecified: Secondary | ICD-10-CM | POA: Diagnosis not present

## 2023-11-20 DIAGNOSIS — E7849 Other hyperlipidemia: Secondary | ICD-10-CM | POA: Diagnosis not present

## 2023-11-20 DIAGNOSIS — Z0001 Encounter for general adult medical examination with abnormal findings: Secondary | ICD-10-CM | POA: Diagnosis not present

## 2023-11-20 DIAGNOSIS — E0865 Diabetes mellitus due to underlying condition with hyperglycemia: Secondary | ICD-10-CM | POA: Diagnosis not present

## 2023-11-20 DIAGNOSIS — E782 Mixed hyperlipidemia: Secondary | ICD-10-CM | POA: Diagnosis not present

## 2023-12-01 DIAGNOSIS — I2510 Atherosclerotic heart disease of native coronary artery without angina pectoris: Secondary | ICD-10-CM | POA: Diagnosis not present

## 2023-12-01 DIAGNOSIS — E781 Pure hyperglyceridemia: Secondary | ICD-10-CM | POA: Diagnosis not present

## 2023-12-01 DIAGNOSIS — I10 Essential (primary) hypertension: Secondary | ICD-10-CM | POA: Diagnosis not present

## 2023-12-01 DIAGNOSIS — E119 Type 2 diabetes mellitus without complications: Secondary | ICD-10-CM | POA: Diagnosis not present

## 2023-12-01 DIAGNOSIS — Z0001 Encounter for general adult medical examination with abnormal findings: Secondary | ICD-10-CM | POA: Diagnosis not present

## 2023-12-01 DIAGNOSIS — E039 Hypothyroidism, unspecified: Secondary | ICD-10-CM | POA: Diagnosis not present

## 2023-12-09 ENCOUNTER — Other Ambulatory Visit: Payer: Self-pay | Admitting: Interventional Cardiology

## 2023-12-11 ENCOUNTER — Other Ambulatory Visit: Payer: Self-pay | Admitting: Cardiovascular Disease

## 2023-12-11 DIAGNOSIS — E119 Type 2 diabetes mellitus without complications: Secondary | ICD-10-CM | POA: Diagnosis not present

## 2023-12-11 DIAGNOSIS — Z0000 Encounter for general adult medical examination without abnormal findings: Secondary | ICD-10-CM | POA: Diagnosis not present

## 2023-12-11 DIAGNOSIS — Z23 Encounter for immunization: Secondary | ICD-10-CM | POA: Diagnosis not present

## 2023-12-11 MED ORDER — CHLORTHALIDONE 25 MG PO TABS: 25.0000 mg | ORAL_TABLET | Freq: Every day | ORAL | 0 refills | Status: DC

## 2023-12-11 MED ORDER — LISINOPRIL 20 MG PO TABS: 20.0000 mg | ORAL_TABLET | Freq: Every day | ORAL | 0 refills | Status: DC

## 2023-12-11 MED ORDER — METOPROLOL TARTRATE 25 MG PO TABS: 25.0000 mg | ORAL_TABLET | Freq: Two times a day (BID) | ORAL | 0 refills | Status: DC

## 2023-12-11 MED ORDER — ATORVASTATIN CALCIUM 40 MG PO TABS: 40.0000 mg | ORAL_TABLET | Freq: Every day | ORAL | 0 refills | Status: DC

## 2023-12-11 NOTE — Addendum Note (Signed)
Addended by: Daphane Shepherd on: 12/11/2023 04:23 PM   Modules accepted: Orders

## 2023-12-11 NOTE — Addendum Note (Signed)
Addended by: Adriana Simas, Mindy Gali L on: 12/11/2023 04:35 PM   Modules accepted: Orders

## 2023-12-22 DIAGNOSIS — H35033 Hypertensive retinopathy, bilateral: Secondary | ICD-10-CM | POA: Diagnosis not present

## 2023-12-22 DIAGNOSIS — Z961 Presence of intraocular lens: Secondary | ICD-10-CM | POA: Diagnosis not present

## 2023-12-22 DIAGNOSIS — H04123 Dry eye syndrome of bilateral lacrimal glands: Secondary | ICD-10-CM | POA: Diagnosis not present

## 2024-02-19 ENCOUNTER — Encounter: Payer: Self-pay | Admitting: Cardiovascular Disease

## 2024-02-19 NOTE — Progress Notes (Unsigned)
  Cardiology Office Note:  .   Date:  02/20/2024  ID:  Kathleen, Terrell 09/03/1946, MRN [id number] PCP: Selinda Flavin, MD  [facility] Providers Cardiologist:  Lance Muss, MD    History of Present Illness: .    Feb. 25, 2025  Kathleen Terrell is a 78 y.o. female former patient of Dr. Eldridge Dace. She is seen for the first time today   Hx of CAD,  CABG ( 2020,  LIMA to LAD, SVG to OM, SVG to RCA)  , HLD , HTN  Has white coat HTN, did not take her BP med yet today   No Cp ,  Exercises on occasion   She watches her salt      ROS:   Studies Reviewed: Marland Kitchen        EKG Interpretation Date/Time:  Tuesday February 20 2024 09:51:02 EST Ventricular Rate:  67 PR Interval:  154 QRS Duration:  72 QT Interval:  406 QTC Calculation: 429 R Axis:   28  Text Interpretation: Normal sinus rhythm Low voltage QRS T wave abnormality, consider anterior ischemia When compared with ECG of 19-Jun-2019 07:01, Non-specific change in ST segment in Anterior leads T wave inversion now evident in Anterior leads Nonspecific T wave abnormality no longer evident in Lateral leads Confirmed by Kristeen Miss ([id number]) on 02/20/2024 10:12:36 AM   Risk Assessment/Calculations:     HYPERTENSION CONTROL Vitals:   02/20/24 0946 02/20/24 0959  BP: (!) 148/72 (!) 144/80    The patient's blood pressure is elevated above target today.  In order to address the patient's elevated BP: Blood pressure will be monitored at home to determine if medication changes need to be made.          Physical Exam:   VS:  BP (!) 144/80   Pulse 67   Ht 5\' 4"  (1.626 m)   Wt 167 lb 9.6 oz (76 kg)   SpO2 99%   BMI 28.77 kg/m    Wt Readings from Last 3 Encounters:  02/20/24 167 lb 9.6 oz (76 kg)  02/15/23 169 lb 6.4 oz (76.8 kg)  11/09/22 175 lb (79.4 kg)    GEN: Well nourished, well developed in no acute distress NECK: No JVD; No carotid bruits CARDIAC: RRR, no murmurs, rubs, gallops RESPIRATORY:  Clear to  auscultation without rales, wheezing or rhonchi  ABDOMEN: Soft, non-tender, non-distended EXTREMITIES:  No edema; No deformity   ASSESSMENT AND PLAN: .      HTN:   BP is generally well controlled.     Cont current meds .  Primary MD is monitoring labs ( not available )   2.  HLD :  managed by her primary MD .  We discussed her LDL goal of < 70  3.  Hypothyroidism:  cont levothyroxine  4  CAD :  no angina .  Cont to work on exercise        Dispo: 1 year    Signed, Kristeen Miss, MD

## 2024-02-20 ENCOUNTER — Encounter: Payer: Self-pay | Admitting: Cardiovascular Disease

## 2024-02-20 ENCOUNTER — Ambulatory Visit: Payer: Medicare Other | Attending: Cardiovascular Disease | Admitting: Cardiovascular Disease

## 2024-02-20 VITALS — BP 144/80 | HR 67 | Ht 64.0 in | Wt 167.6 lb

## 2024-02-20 DIAGNOSIS — I25118 Atherosclerotic heart disease of native coronary artery with other forms of angina pectoris: Secondary | ICD-10-CM | POA: Diagnosis not present

## 2024-02-20 DIAGNOSIS — E782 Mixed hyperlipidemia: Secondary | ICD-10-CM

## 2024-02-20 DIAGNOSIS — I10 Essential (primary) hypertension: Secondary | ICD-10-CM

## 2024-02-20 NOTE — Patient Instructions (Signed)
 Follow-Up: At [facility], you and your health needs are our priority.  As part of our continuing mission to provide you with exceptional heart care, we have created designated Provider Care Teams.  These Care Teams include your primary Cardiologist (physician) and Advanced Practice Providers (APPs -  Physician Assistants and Nurse Practitioners) who all work together to provide you with the care you need, when you need it.  Your next appointment:   1 year(s)  Provider:   Kristeen Miss, MD

## 2024-03-12 ENCOUNTER — Other Ambulatory Visit: Payer: Self-pay | Admitting: Cardiovascular Disease

## 2024-05-27 DIAGNOSIS — I10 Essential (primary) hypertension: Secondary | ICD-10-CM | POA: Diagnosis not present

## 2024-05-27 DIAGNOSIS — E1165 Type 2 diabetes mellitus with hyperglycemia: Secondary | ICD-10-CM | POA: Diagnosis not present

## 2024-05-27 DIAGNOSIS — Z1329 Encounter for screening for other suspected endocrine disorder: Secondary | ICD-10-CM | POA: Diagnosis not present

## 2024-05-27 DIAGNOSIS — E785 Hyperlipidemia, unspecified: Secondary | ICD-10-CM | POA: Diagnosis not present

## 2024-05-27 DIAGNOSIS — E781 Pure hyperglyceridemia: Secondary | ICD-10-CM | POA: Diagnosis not present

## 2024-06-03 DIAGNOSIS — I10 Essential (primary) hypertension: Secondary | ICD-10-CM | POA: Diagnosis not present

## 2024-06-03 DIAGNOSIS — E039 Hypothyroidism, unspecified: Secondary | ICD-10-CM | POA: Diagnosis not present

## 2024-06-03 DIAGNOSIS — E1165 Type 2 diabetes mellitus with hyperglycemia: Secondary | ICD-10-CM | POA: Diagnosis not present

## 2024-06-03 DIAGNOSIS — E785 Hyperlipidemia, unspecified: Secondary | ICD-10-CM | POA: Diagnosis not present

## 2024-07-17 DIAGNOSIS — Z1231 Encounter for screening mammogram for malignant neoplasm of breast: Secondary | ICD-10-CM | POA: Diagnosis not present

## 2024-08-17 DIAGNOSIS — S82831A Other fracture of upper and lower end of right fibula, initial encounter for closed fracture: Secondary | ICD-10-CM | POA: Diagnosis not present

## 2024-08-17 DIAGNOSIS — S82434A Nondisplaced oblique fracture of shaft of right fibula, initial encounter for closed fracture: Secondary | ICD-10-CM | POA: Diagnosis not present

## 2024-08-17 DIAGNOSIS — E860 Dehydration: Secondary | ICD-10-CM | POA: Diagnosis not present

## 2024-08-17 DIAGNOSIS — I2510 Atherosclerotic heart disease of native coronary artery without angina pectoris: Secondary | ICD-10-CM | POA: Diagnosis not present

## 2024-08-17 DIAGNOSIS — W19XXXA Unspecified fall, initial encounter: Secondary | ICD-10-CM | POA: Diagnosis not present

## 2024-08-17 DIAGNOSIS — R55 Syncope and collapse: Secondary | ICD-10-CM | POA: Diagnosis not present

## 2024-08-17 DIAGNOSIS — S82891A Other fracture of right lower leg, initial encounter for closed fracture: Secondary | ICD-10-CM | POA: Diagnosis not present

## 2024-08-17 DIAGNOSIS — I10 Essential (primary) hypertension: Secondary | ICD-10-CM | POA: Diagnosis not present

## 2024-08-17 DIAGNOSIS — S0990XA Unspecified injury of head, initial encounter: Secondary | ICD-10-CM | POA: Diagnosis not present

## 2024-08-17 DIAGNOSIS — R001 Bradycardia, unspecified: Secondary | ICD-10-CM | POA: Diagnosis not present

## 2024-08-17 DIAGNOSIS — Z91040 Latex allergy status: Secondary | ICD-10-CM | POA: Diagnosis not present

## 2024-08-17 DIAGNOSIS — R404 Transient alteration of awareness: Secondary | ICD-10-CM | POA: Diagnosis not present

## 2024-08-17 DIAGNOSIS — Z885 Allergy status to narcotic agent status: Secondary | ICD-10-CM | POA: Diagnosis not present

## 2024-08-17 NOTE — ED Provider Notes (Signed)
 Emergency Department Provider Note    ED Clinical Impression   Final diagnoses:  Syncope, unspecified syncope type (Primary)  Orthostasis  Fall, initial encounter  Closed fracture of right ankle, initial encounter  Dehydration    ED Assessment/Plan    Condition: Stable Disposition: Discharge  This chart has been completed using Dragon Medical Dictation software, and while attempts have been made to ensure accuracy, certain words and phrases may not be transcribed as intended.   History   Chief Complaint  Patient presents with  . Near Syncope  . Fall  . Ankle Pain   HPI  Kathleen Terrell is a 78 y.o. female  who presents today to the  emergency department complaining of a near syncopal visit.  Patient states that she felt dizzy while walking [place] the flea market.  She had a jacket on.  She had been out all morning at the flea market.  EMS reports that she was orthostatic.  The patient did not fully lose consciousness.  She denies any chest pain.  She apparently hurt her ankle in the process.  Complains of right ankle pain.  She denies any other complaints at this time.    Allergies: is allergic to methionine, latex, propoxyphene n-acetaminophen , and tramadol hcl. Medications: is not on any long-term medications. PMHx:  has a past medical history of Coronary artery disease and Hypertension. PSHx:  has a past surgical history that includes Breast cyst excision. SocHx:  reports that she has never smoked. She has never used smokeless tobacco. She reports that she does not drink alcohol and does not use drugs. Allergies, Medications, Medical, Surgical, and Social History were reviewed as documented above.   Social Drivers of Health with Concerns   Food Insecurity: Not on file  Transportation Needs: Not on file  Alcohol Use: Not on file  Housing: Not on file  Physical Activity: Not  on file  Utilities: Not on file  Stress: Not on file  Interpersonal Safety: Not on file  Substance Use: Not on file (08/17/2024)  Intimate Partner Violence: Not on file  Social Connections: Not on file  Financial Resource Strain: Not on file  Health Literacy: Not on file  Internet Connectivity: Not on file     Review Of Systems  Review of Systems  Constitutional:  Negative for fever.  HENT:  Negative for congestion.   Respiratory:  Negative for chest tightness and shortness of breath.   Cardiovascular:  Negative for chest pain.  Gastrointestinal:  Negative for abdominal pain, diarrhea, nausea and vomiting.  Musculoskeletal:        Right ankle pain.  Skin:  Negative for color change.  Neurological:  Positive for dizziness.       Near syncope  Psychiatric/Behavioral:  Negative for behavioral problems.   All other systems reviewed and are negative.   Physical Exam   BP 140/72   Pulse 61   Temp 36.7 C (98 F) (Oral)   Resp 15   Ht 160 cm (5' 3)   Wt 79.4 kg (175 lb)   SpO2 100%   BMI 31.00 kg/m   Physical Exam Vitals and nursing note reviewed.  Constitutional:      General: She is not in acute distress. HENT:     Head: Normocephalic.  Eyes:  Conjunctiva/sclera: Conjunctivae normal.  Neck:     Comments: Neck is supple.  No tenderness. Cardiovascular:     Rate and Rhythm: Regular rhythm.     Pulses: Normal pulses.     Heart sounds: Normal heart sounds.  Pulmonary:     Effort: No respiratory distress.     Breath sounds: Normal breath sounds.  Chest:     Chest wall: No tenderness.  Abdominal:     General: There is no distension.     Tenderness: There is no abdominal tenderness. There is no guarding or rebound.     Comments: Abdomen soft, nontender, normoactive bowel sounds.  Musculoskeletal:        General: Tenderness present. No deformity.     Cervical back: Normal range of motion and neck supple. No tenderness.     Comments: There is slight right  ankle tenderness.  No deformity.  Normal Thompson test.  Normal distal neurovascular exam of the right lower extremity.  Normal exam of all other extremities.  There is no midline lumbar or thoracic spine tenderness or step-offs.  Skin:    General: Skin is warm.     Capillary Refill: Capillary refill takes 2 to 3 seconds.     Comments: Normal cap refill.  Neurological:     General: No focal deficit present.     Mental Status: She is oriented to person, place, and time.     Cranial Nerves: No cranial nerve deficit.     Sensory: No sensory deficit.     Motor: No weakness.     Comments: There are no motor, sensory or cerebellar deficits.  Nonfocal neurologic exam.  GCS is 15.  Cranial nerves grossly intact.  NIH score is 0.  Psychiatric:        Mood and Affect: Mood normal.     ED Course  Medical Decision Making Differential diagnosis includes vasovagal syncope versus hypovolemia likely secondary to dehydration versus less likely arrhythmia.  Will get head CT scan to rule out any intracranial pathology.  Given patient's comorbidities, will check troponin.  EKG shows normal sinus rhythm at 67 bpm.  Normal axis.  Normal intervals.  No acute injury pattern.  1:20 PM Patient is doing well.  Mildly symptomatic.  Feels much better after IV fluids.  X-ray does show any acute fracture.  I discussed findings with Dr. Heyward.  He agrees with plan for posterior splint and follow-up as an outpatient.  2:16 PM The patient is doing well.  Awaiting second troponin.  2:45 PM Patient is doing well.  High-sensitivity troponin negative x 2.  No chest pain.  She feels much better after IV fluids.  Patient evaluated post splint application.  Normal distal neurovascular exam.  Patient is stable for discharge.  Orthopedic referral given.  She does not want any pain medication.  I have reviewed my clinical findings and studies and my clinical impression with the patient. The patient has expressed  understanding that at this time there is no evidence for a more malignant underlying process, but the patient also understands that early in the process of a condition such as this, an initial workup can be falsely reassuring. I have counseled the patient and discussed follow-up with the patient, stressing the importance of appropriate follow-up. I have also counseled the patient to return if worse or any concerns. Routine discharge counseling was given to the patient and the patient understands that worsening, changing or persistent symptoms should prompt an immediate call or follow up  with their primary physician or return to the emergency department for reevaluation. Patient has expressed understanding.     Problems Addressed: Closed fracture of right ankle, initial encounter: acute illness or injury that poses a threat to life or bodily functions Dehydration: acute illness or injury that poses a threat to life or bodily functions Orthostasis: acute illness or injury that poses a threat to life or bodily functions Syncope, unspecified syncope type: acute illness or injury that poses a threat to life or bodily functions  Amount and/or Complexity of Data Reviewed Independent Historian: EMS Labs: ordered. Decision-making details documented in ED Course. Radiology: ordered and independent interpretation performed. Decision-making details documented in ED Course.    Details: Distal fibular fracture ECG/medicine tests: ordered and independent interpretation performed. Decision-making details documented in ED Course.  Risk OTC drugs. Prescription drug management. Decision regarding hospitalization.     Procedures   Encounter Date: 08/17/24  ECG 12 Lead  Result Value   EKG Systolic BP    EKG Diastolic BP    EKG Ventricular Rate 59   EKG Atrial Rate 59   EKG P-R Interval 174   EKG QRS Duration 74   EKG Q-T Interval 442   EKG QTC Calculation 437   EKG Calculated P Axis 60   EKG  Calculated R Axis 37   EKG Calculated T Axis 38   QTC Fredericia 439   Narrative   Sinus bradycardia Low voltage QRS Nonspecific T wave abnormality Abnormal ECG When compared with ECG of 17-Aug-2024 10:58, No significant change was found Confirmed by Cherie Searle ([id number]) on 08/17/2024 2:09:40 PM     ED Results Results for orders placed or performed during the hospital encounter of 08/17/24  Comprehensive Metabolic Panel  Result Value Ref Range   Sodium 143 135 - 145 mmol/L   Potassium 3.8 3.5 - 5.0 mmol/L   Chloride 106 98 - 107 mmol/L   CO2 31.4 21.0 - 32.0 mmol/L   Anion Gap 6 3 - 11 mmol/L   BUN 10 8 - 20 mg/dL   Creatinine 9.06 9.39 - 1.10 mg/dL   BUN/Creatinine Ratio 11    eGFR CKD-EPI (2021) Female 63 >=60 mL/min/1.69m2   Glucose 125 70 - 179 mg/dL   Calcium  9.6 8.5 - 10.1 mg/dL   Albumin  3.6 3.5 - 5.0 g/dL   Total Protein 7.4 6.0 - 8.0 g/dL   Total Bilirubin 0.6 0.3 - 1.2 mg/dL   AST 24 15 - 40 U/L   ALT 19 12 - 78 U/L   Alkaline Phosphatase 75 46 - 116 U/L  Magnesium   Result Value Ref Range   Magnesium  1.8 1.6 - 2.6 mg/dL  hsTroponin I (serial 9-7-3Y w/ delta)  Result Value Ref Range   hsTroponin I 16 <=34 ng/L  TSH  Result Value Ref Range   TSH 3.059 0.550 - 4.780 uIU/mL  T4, free  Result Value Ref Range   Free T4 1.34 0.89 - 1.76 ng/dL  CK total and CKMB  Result Value Ref Range   Creatine Kinase, Total 91.0 34.0 - 145.0 U/L   CK-MB 1.15 0.00 - 5.00 ng/mL   CK Index 1.3 %  hsTroponin I - 2 Hour  Result Value Ref Range   hsTroponin I 16 <=34 ng/L   delta hsTroponin I 0 <=7 ng/L  ECG 12 Lead  Result Value Ref Range   EKG Systolic BP  mmHg   EKG Diastolic BP  mmHg   EKG Ventricular Rate 67 BPM  EKG Atrial Rate 67 BPM   EKG P-R Interval 160 ms   EKG QRS Duration 74 ms   EKG Q-T Interval 426 ms   EKG QTC Calculation 450 ms   EKG Calculated P Axis 65 degrees   EKG Calculated R Axis 50 degrees   EKG Calculated T Axis 46 degrees   QTC Fredericia  442 ms  ECG 12 Lead  Result Value Ref Range   EKG Systolic BP  mmHg   EKG Diastolic BP  mmHg   EKG Ventricular Rate 59 BPM   EKG Atrial Rate 59 BPM   EKG P-R Interval 174 ms   EKG QRS Duration 74 ms   EKG Q-T Interval 442 ms   EKG QTC Calculation 437 ms   EKG Calculated P Axis 60 degrees   EKG Calculated R Axis 37 degrees   EKG Calculated T Axis 38 degrees   QTC Fredericia 439 ms  CBC w/ Differential  Result Value Ref Range   WBC 5.8 4.0 - 10.5 10*9/L   RBC 4.17 3.80 - 5.10 10*12/L   HGB 11.8 11.5 - 15.0 g/dL   HCT 64.6 65.9 - 55.9 %   MCV 84.7 80.0 - 98.0 fL   MCH 28.3 27.0 - 34.0 pg   MCHC 33.4 32.0 - 36.0 g/dL   RDW 85.5 88.4 - 85.4 %   MPV 10.4 7.4 - 10.4 fL   Platelet 171 140 - 415 10*9/L   Neutrophils % 48.1 %   Lymphocytes % 40.0 %   Monocytes % 6.2 %   Eosinophils % 5.0 %   Basophils % 0.5 %   Absolute Neutrophils 2.8 1.8 - 7.8 10*9/L   Absolute Lymphocytes 2.3 0.7 - 4.5 10*9/L   Absolute Monocytes 0.4 0.1 - 1.0 10*9/L   Absolute Eosinophils 0.3 0.0 - 0.4 10*9/L   Absolute Basophils 0.0 0.0 - 0.2 10*9/L   ECG 12 Lead Result Date: 08/17/2024 Normal sinus rhythm Low voltage QRS T wave abnormality, consider anterior ischemia Abnormal ECG No previous ECGs available Confirmed by Cherie Searle ([id number]) on 08/17/2024 2:09:49 PM  ECG 12 Lead Result Date: 08/17/2024 Sinus bradycardia Low voltage QRS Nonspecific T wave abnormality Abnormal ECG When compared with ECG of 17-Aug-2024 10:58, No significant change was found Confirmed by Cherie Searle ([id number]) on 08/17/2024 2:09:40 PM  XR Ankle 3 or More Views Right Result Date: 08/17/2024 Exam:  Right Ankle  History:  Fall, ankle pain  Technique:  3 views  Comparison:  None.  Findings: Nondisplaced oblique fracture of the distal fibula above the level of the syndesmosis. The ankle mortise is symmetric. The joint spaces are preserved. No focal soft tissue abnormality.    Nondisplaced oblique fracture of the distal fibula above  the level of the syndesmosis.  Signed (Electronic Signature): 08/17/2024 12:40 PM Signed By: Reyes Agreste, MD  CT head without contrast Result Date: 08/17/2024 Exam:  CT Head without Contrast  History:  Fall  Technique: Routine brain CT without IV contrast. AEC (automated exposure control) and/or manual techniques such as size-specific kV and mAs are employed where appropriate to reduce radiation exposure for all CT exams.  Comparison:  CT head 03/25/2006  Findings:   BRAIN:  No CT evidence of acute infarction, hemorrhage, edema, mass or mass effect. Ventricles and basilar cisterns are unremarkable. Mild white matter microvascular ischemic change.  SOFT TISSUES:  Negative. CALVARIUM:  Negative. No fracture. SINUSES AND MASTOIDS:  No mucosal thickening or fluid.    No acute intracranial findings.  Signed (Electronic Signature): 08/17/2024 12:26 PM Signed By: Kristopher Tantillo, MD   Medications Administered:  Medications  sodium chloride  0.9% (NS) bolus 1,000 mL (0 mL Intravenous Stopped 08/17/24 1346)    Discharge Medications (Medications Prescribed during this  ED visit and Patient's Home Medications) :    Your Medication List    You have not been prescribed any medications.       Cherie Ardeen Hanger, MD 08/17/24 [phone]

## 2024-08-17 NOTE — ED Triage Notes (Signed)
 Patient reports she was [place] the flea market when she became lightheaded. Patient reports she collapsed to ground but did not lose consciousness. Also c/o right ankle soreness. EMS reports patient was othostatic when standing on scene with BP 146/77 and dropped to 118/77 with heart rate maintaining in the 70s.

## 2024-08-17 NOTE — ED Notes (Signed)
 Orthostatic Vital Signs-  Lying: BP- 129/51 (76) HR- 61  Sitting: BP- 127/68 (86) HR- 70  Standing: BP- 116/65 HR- 74

## 2024-08-22 DIAGNOSIS — M25571 Pain in right ankle and joints of right foot: Secondary | ICD-10-CM | POA: Diagnosis not present

## 2024-08-22 DIAGNOSIS — S82891A Other fracture of right lower leg, initial encounter for closed fracture: Secondary | ICD-10-CM | POA: Diagnosis not present

## 2024-08-28 DIAGNOSIS — S82891A Other fracture of right lower leg, initial encounter for closed fracture: Secondary | ICD-10-CM | POA: Diagnosis not present

## 2024-08-28 DIAGNOSIS — S82831D Other fracture of upper and lower end of right fibula, subsequent encounter for closed fracture with routine healing: Secondary | ICD-10-CM | POA: Diagnosis not present

## 2024-08-28 DIAGNOSIS — R55 Syncope and collapse: Secondary | ICD-10-CM | POA: Diagnosis not present

## 2024-08-30 NOTE — Progress Notes (Signed)
 Cardiology Office Note:    Date:  09/04/2024   ID:  Kathleen Terrell, DOB January 13, 1946, MRN [id number]  PCP:  Kayla Drivers, MD   [facility] Providers Cardiologist:  Dorn Lesches, MD    Previously Dr. Dann, then Dr. Alveta  Referring MD: Kayla Drivers, MD   Chief Complaint  Patient presents with   Follow-up    syncope    History of Present Illness:    Kathleen Terrell is a 78 y.o. female with a hx of CAD s/p CABG (LIMA-LAD, SVG-OM, SVG-RCA - 2020), HTN, and HLD. Abnormal coronary CTA lead to heart cath that showed three vessel disease. She underwent CABG in 2020 and completed cardiac rehab.   Pr previously followed with Dr. Dann, then Dr. Alveta. Presents today to re-establish with Dr. Lesches.   She had a syncopal episode 08/17/24 in which she collapsed but stated she did not lose consciousness.   She presents for follow up. She was at a market and had not eaten or hydrated prior to shopping and passed out. She felt lightheaded, went to get in the car, passed out while sitting in the car and fell out of the car fracturing her right ankle.    Past Medical History:  Diagnosis Date   Abnormal stress test    Acute serous otitis media    LEFT EAR   Arthritis    Atopic eczema    DERMATITIS   CAD (coronary artery disease)    Cataracts, bilateral    Colon adenomas    Diverticulitis 11/24/2011   DM (diabetes mellitus) (HCC)    patient denies, not on medications, diet controlled   Headache    Heme + stool 03/22/2017   History of colonic polyps 03/22/2017   HTN (hypertension)    Hyperlipidemia    Hypothyroidism    Impacted cerumen of left ear    Low back pain    Melena    PONV (postoperative nausea and vomiting)    Spondylolisthesis of lumbar region 09/06/2016   Syncope     Past Surgical History:  Procedure Laterality Date   BACK SURGERY  09/05/2016   L4 and L5    COLONOSCOPY  [facility]   >5 YEARS AGO   COLONOSCOPY  12/12/2011   Procedure:  COLONOSCOPY;  Surgeon: Margo CHRISTELLA Haddock, MD;  [facility];  Service: Endoscopy;  Laterality: N/A;  9:00   COLONOSCOPY N/A 04/14/2017   Procedure: COLONOSCOPY;  Surgeon: Margo LITTIE Haddock, MD;  [facility];  Service: Endoscopy;  Laterality: N/A;  1030 - per office, pt can't come earlier    COLONOSCOPY WITH PROPOFOL  N/A 11/09/2022   Procedure: COLONOSCOPY WITH PROPOFOL ;  Surgeon: Shaaron Lamar CHRISTELLA, MD;  [facility];  Service: Endoscopy;  Laterality: N/A;  10:45am, asa 3, moved up to 8:15 per Tammy   CORONARY ARTERY BYPASS GRAFT N/A 06/18/2019   Procedure: CORONARY ARTERY BYPASS GRAFTING (CABG) x 3, ON PUMP, LIMA TO LAD, SVG TO RCA, SVG TO OM 1, USING LEFT INTERNAL MAMMARY ARTERY AND RIGHT GREATER SAPHENOUS VEIN HARVESTED ENDOSCOPICALLY;  Surgeon: Army Dallas NOVAK, MD;  Location: [facility] OR;  Service: Open Heart Surgery;  Laterality: N/A;   cyst removed     under the left arm   DILATION AND CURETTAGE OF UTERUS     LEFT HEART CATH AND CORONARY ANGIOGRAPHY N/A 06/14/2019   Procedure: LEFT HEART CATH AND CORONARY ANGIOGRAPHY;  Surgeon: Dann Candyce RAMAN, MD;  Location: [facility] INVASIVE CV LAB;  Service: Cardiovascular;  Laterality: N/A;   POLYPECTOMY  04/14/2017   Procedure: POLYPECTOMY;  Surgeon: Margo LITTIE Haddock, MD;  [facility];  Service: Endoscopy;;  ascending, hepatic flexure, prox. transverse, sigmoid x3   POLYPECTOMY  11/09/2022   Procedure: POLYPECTOMY;  Surgeon: Shaaron Lamar HERO, MD;  [facility];  Service: Endoscopy;;   SALPINGOOPHORECTOMY     left   TEE WITHOUT CARDIOVERSION N/A 06/18/2019   Procedure: TRANSESOPHAGEAL ECHOCARDIOGRAM (TEE);  Surgeon: Army Dallas NOVAK, MD;  Location[facility];  Service: Open Heart Surgery;  Laterality: N/A;   TUBAL LIGATION     tubual pregnancy      Current Medications: Current Meds  Medication Sig   acetaminophen  (TYLENOL ) 500 MG tablet Take 500 mg by mouth every 6 (six) hours as needed (PAIN.).   aspirin  EC 81  MG tablet Take 81 mg by mouth in the morning. Swallow whole.   atorvastatin  (LIPITOR ) 40 MG tablet TAKE 1 TABLET BY MOUTH DAILY   Calcium  Carb-Cholecalciferol (CALCIUM  + D3 PO) Take 1 tablet by mouth in the morning and at bedtime.   chlorthalidone  (HYGROTON ) 25 MG tablet TAKE 1 TABLET BY MOUTH DAILY   Cholecalciferol (VITAMIN D3 PO) Take 1 tablet by mouth in the morning.   levothyroxine  (SYNTHROID ) 75 MCG tablet Take 75 mcg by mouth daily before breakfast.   lisinopril  (ZESTRIL ) 20 MG tablet TAKE 1 TABLET BY MOUTH DAILY   metoprolol  tartrate (LOPRESSOR ) 25 MG tablet TAKE 1 TABLET BY MOUTH TWICE  DAILY   Misc Natural Products (TART CHERRY ADVANCED PO) Take 1 capsule by mouth daily.   NON FORMULARY Take 1,000 mg by mouth in the morning. BEET ROOT SUPPLEMENT: 1000 MG DAILY   Probiotic Product (PROBIOTIC FORMULA PO) Take 1 capsule by mouth daily in the afternoon.   TURMERIC CURCUMIN PO Take 1 capsule by mouth in the morning.     Allergies:   Methionine, Darvocet [propoxyphene n-acetaminophen ], Latex, and Ultram [tramadol hcl]   Social History   Socioeconomic History   Marital status: Widowed    Spouse name: Not on file   Number of children: Not on file   Years of education: Not on file   Highest education level: Not on file  Occupational History   Occupation: HAIR DRESSER- [employer]  Tobacco Use   Smoking status: Former    Current packs/day: 0.00    Average packs/day: 1 pack/day for 19.0 years (19.0 ttl pk-yrs)    Types: Cigarettes    Start date: 03/09/1965    Quit date: 03/09/1984    Years since quitting: 40.5   Smokeless tobacco: Never  Vaping Use   Vaping status: Never Used  Substance and Sexual Activity   Alcohol use: No   Drug use: No   Sexual activity: Not on file  Other Topics Concern   Not on file  Social History Narrative   Used to work for [employer] 78 yo   Social Drivers of Corporate investment banker Strain: Not on file  Food  Insecurity: Not on file  Transportation Needs: Not on file  Physical Activity: Not on file  Stress: Not on file  Social Connections: Not on file     Family History: The patient's family history includes CAD in her mother; Diabetes Mellitus II in her mother; Heart attack in her brother and mother; Hypertension in her daughter and mother; Kidney disease in her brother; Stroke in her brother; Thyroid  disease in her mother. There is  no history of Colon cancer or Colon polyps.  ROS:   Please see the history of present illness.     All other systems reviewed and are negative.  EKGs/Labs/Other Studies Reviewed:    The following studies were reviewed today:       Recent Labs: No results found for requested labs within last 365 days.  Recent Lipid Panel    Component Value Date/Time   CHOL 122 10/08/2019 0752   TRIG 79 10/08/2019 0752   HDL 48 10/08/2019 0752   CHOLHDL 2.5 10/08/2019 0752   LDLCALC 58 10/08/2019 0752     Risk Assessment/Calculations:         Physical Exam:    VS:  BP (!) 148/73   Pulse 61   Ht 5' 4 (1.626 m)   Wt 170 lb 3.2 oz (77.2 kg)   SpO2 97%   BMI 29.21 kg/m     Wt Readings from Last 3 Encounters:  09/04/24 170 lb 3.2 oz (77.2 kg)  02/20/24 167 lb 9.6 oz (76 kg)  02/15/23 169 lb 6.4 oz (76.8 kg)     GEN:  Well nourished, well developed in no acute distress HEENT: Normal NECK: No JVD; No carotid bruits LYMPHATICS: No lymphadenopathy CARDIAC: RRR, no murmurs, rubs, gallops RESPIRATORY:  Clear to auscultation without rales, wheezing or rhonchi  ABDOMEN: Soft, non-tender, non-distended MUSCULOSKELETAL:  No edema; No deformity  SKIN: Warm and dry NEUROLOGIC:  Alert and oriented x 3 PSYCHIATRIC:  Normal affect   ASSESSMENT:    1. Syncope, unspecified syncope type   2. Coronary artery disease involving native coronary artery of native heart with other form of angina pectoris (HCC)   3. S/P CABG (coronary artery bypass graft)   4. Essential  hypertension   5. Mixed hyperlipidemia    PLAN:    In order of problems listed above:  Syncope - will place a live monitor - obtain echo - suspect dehydration, but given cardiac history will check echo and monitor   CAD s/p CABG 2020 (LIMA-LAD, SVG-OM, SVG-RCA) - no chest pain - continue ASA, 25 mg lopressor  BID, 40 mg lipitor  - no recent ischemic evaluation   Hypertension - continue BB, continue 20 mg lisinopril , 25 mg chlorthalidone  - elevated here today because she is in pain with ankle and walked from parking deck - has been SBP 120s at home   Hyperlipidemia with LDL goal < 70 - continue 40 mg lipitor     I did discuss the [place] DMV medical guidelines for driving: it is prudent to recommend that all persons should be free of syncopal episodes for at least six months to be granted the driving privilege. (THE [place]  PHYSICIAN'S GUIDE TO DRIVER MEDICAL EVALUATION, Second Edition, Medical Review Branch, Associate Professor, Division of [employer], [place]  Department of Transportation, July 2004)   Will now establish with Dr. Court as DOD. Will follow up with him in 6 weeks.   Medication Adjustments/Labs and Tests Ordered: Current medicines are reviewed at length with the patient today.  Concerns regarding medicines are outlined above.  Orders Placed This Encounter  Procedures   LONG TERM MONITOR-LIVE TELEMETRY (3-14 DAYS)   ECHOCARDIOGRAM COMPLETE   No orders of the defined types were placed in this encounter.   Patient Instructions  Medication Instructions:  Your physician recommends that you continue on your current medications as directed. Please refer to the Current Medication list given to you today.  *If you need a refill on your cardiac  medications before your next appointment, please call your pharmacy*  Lab Work: NONE ordered at this time of appointment   Testing/Procedures:  Your physician has requested that you have an  echocardiogram. Echocardiography is a painless test that uses sound waves to create images of your heart. It provides your doctor with information about the size and shape of your heart and how well your heart's chambers and valves are working. This procedure takes approximately one hour. There are no restrictions for this procedure. Please do NOT wear cologne, perfume, aftershave, or lotions (deodorant is allowed). Please arrive 15 minutes prior to your appointment time.  Please note: We ask at that you not bring children with you during ultrasound (echo/ vascular) testing. Due to room size and safety concerns, children are not allowed in the ultrasound rooms during exams. Our front office staff cannot provide observation of children in our lobby area while testing is being conducted. An adult accompanying a patient to their appointment will only be allowed in the ultrasound room at the discretion of the ultrasound technician under special circumstances. We apologize for any inconvenience.  ZIO AT Long term monitor-Live Telemetry  Your physician has requested you wear a ZIO patch monitor for 14 days.  This is a single patch monitor. [employer] supplies one patch monitor per enrollment. Additional  stickers are not available.  Please do not apply patch if you will be having a Nuclear Stress Test, Echocardiogram, Cardiac CT, MRI,  or Chest Xray during the period you would be wearing the monitor. The patch cannot be worn during  these tests. You cannot remove and re-apply the ZIO AT patch monitor.  Your ZIO patch monitor will be mailed 3 day USPS to your address on file. It may take 3-5 days to  receive your monitor after you have been enrolled.  Once you have received your monitor, please review the enclosed instructions. Your monitor has  already been registered assigning a specific monitor serial # to you.   Billing and Patient Assistance Program information  Meredeth has been supplied with any  insurance information on record for billing. [employer] offers a sliding scale Patient Assistance Program for patients without insurance, or whose  insurance does not completely cover the cost of the ZIO patch monitor. You must apply for the  Patient Assistance Program to qualify for the discounted rate. To apply, call [employer] at [phone],  select option 4, select option 2 , ask to apply for the Patient Assistance Program, (you can request an  interpreter if needed). [employer] will ask your household income and how many people are in your  household. [employer] will quote your out-of-pocket cost based on this information. They will also be able  to set up a 12 month interest free payment plan if needed.  Applying the monitor   Shave hair from upper left chest.  Hold the abrader disc by orange tab. Rub the abrader in 40 strokes over left upper chest as indicated in  your monitor instructions.  Clean area with 4 enclosed alcohol pads. Use all pads to ensure the area is cleaned thoroughly. Let  dry.  Apply patch as indicated in monitor instructions. Patch will be placed under collarbone on left side of  chest with arrow pointing upward.  Rub patch adhesive wings for 2 minutes. Remove the white label marked 1. Remove the white label  marked 2. Rub patch adhesive wings for 2 additional minutes.  While looking in a mirror, press and release button in center  of patch. A small green light will flash 3-4  times. This will be your only indicator that the monitor has been turned on.  Do not shower for the first 24 hours. You may shower after the first 24 hours.  Press the button if you feel a symptom. You will hear a small click. Record Date, Time and Symptom in  the Patient Log.   Starting the Gateway  In your kit there is a Audiological scientist box the size of a cellphone. This is Buyer, retail. It transmits all your  recorded data to [facility]. This box must always stay within 10 feet of you. Open the box  and push the *  button. There will be a light that blinks orange and then green a few times. When the light stops  blinking, the Gateway is connected to the ZIO patch. Call [employer] at [phone] to confirm your monitor is transmitting.  Returning your monitor  Remove your patch and place it inside the Gateway. In the lower half of the Gateway there is a white  bag with prepaid postage on it. Place Gateway in bag and seal. Mail package back to [place] as soon as  possible. Your physician should have your final report approximately 7 days after you have mailed back  your monitor. Call [facility] Customer Care at [phone] if you have questions regarding your ZIO AT  patch monitor. Call them immediately if you see an orange light blinking on your monitor.  If your monitor falls off in less than 4 days, contact our Monitor department at [phone]. If your  monitor becomes loose or falls off after 4 days call [employer] at [phone] for suggestions on  securing your monitor   Follow-Up: At [facility], you and your health needs are our priority.  As part of our continuing mission to provide you with exceptional heart care, our providers are all part of one team.  This team includes your primary Cardiologist (physician) and Advanced Practice Providers or APPs (Physician Assistants and Nurse Practitioners) who all work together to provide you with the care you need, when you need it.  Your next appointment:   6 week(s)  Provider:   Dwana ,MD only     We recommend signing up for the patient portal called MyChart.  Sign up information is provided on this After Visit Summary.  MyChart is used to connect with patients for Virtual Visits (Telemedicine).  Patients are able to view lab/test results, encounter notes, upcoming appointments, etc.  Non-urgent messages can be sent to your provider as well.   To learn more about what you can do with MyChart, go to  [email or web address].   Other Instructions        Signed, Jon Nat Hails, [place]  09/04/2024 2:59 PM    [facility]

## 2024-09-04 ENCOUNTER — Ambulatory Visit

## 2024-09-04 ENCOUNTER — Ambulatory Visit: Attending: Cardiovascular Disease | Admitting: Physician Assistant

## 2024-09-04 ENCOUNTER — Encounter: Payer: Self-pay | Admitting: Physician Assistant

## 2024-09-04 VITALS — BP 148/73 | HR 61 | Ht 64.0 in | Wt 170.2 lb

## 2024-09-04 DIAGNOSIS — Z951 Presence of aortocoronary bypass graft: Secondary | ICD-10-CM

## 2024-09-04 DIAGNOSIS — E782 Mixed hyperlipidemia: Secondary | ICD-10-CM

## 2024-09-04 DIAGNOSIS — R55 Syncope and collapse: Secondary | ICD-10-CM

## 2024-09-04 DIAGNOSIS — I10 Essential (primary) hypertension: Secondary | ICD-10-CM | POA: Diagnosis not present

## 2024-09-04 DIAGNOSIS — I25118 Atherosclerotic heart disease of native coronary artery with other forms of angina pectoris: Secondary | ICD-10-CM | POA: Diagnosis not present

## 2024-09-04 NOTE — Progress Notes (Unsigned)
 Applied a 14 day Zio AT monitor to patient in the office   Court to read

## 2024-09-04 NOTE — Patient Instructions (Addendum)
 Medication Instructions:  Your physician recommends that you continue on your current medications as directed. Please refer to the Current Medication list given to you today.  *If you need a refill on your cardiac medications before your next appointment, please call your pharmacy*  Lab Work: NONE ordered at this time of appointment   Testing/Procedures:  Your physician has requested that you have an echocardiogram. Echocardiography is a painless test that uses sound waves to create images of your heart. It provides your doctor with information about the size and shape of your heart and how well your heart's chambers and valves are working. This procedure takes approximately one hour. There are no restrictions for this procedure. Please do NOT wear cologne, perfume, aftershave, or lotions (deodorant is allowed). Please arrive 15 minutes prior to your appointment time.  Please note: We ask at that you not bring children with you during ultrasound (echo/ vascular) testing. Due to room size and safety concerns, children are not allowed in the ultrasound rooms during exams. Our front office staff cannot provide observation of children in our lobby area while testing is being conducted. An adult accompanying a patient to their appointment will only be allowed in the ultrasound room at the discretion of the ultrasound technician under special circumstances. We apologize for any inconvenience.  ZIO AT Long term monitor-Live Telemetry  Your physician has requested you wear a ZIO patch monitor for 14 days.  This is a single patch monitor. [employer] supplies one patch monitor per enrollment. Additional  stickers are not available.  Please do not apply patch if you will be having a Nuclear Stress Test, Echocardiogram, Cardiac CT, MRI,  or Chest Xray during the period you would be wearing the monitor. The patch cannot be worn during  these tests. You cannot remove and re-apply the ZIO AT patch monitor.   Your ZIO patch monitor will be mailed 3 day USPS to your address on file. It may take 3-5 days to  receive your monitor after you have been enrolled.  Once you have received your monitor, please review the enclosed instructions. Your monitor has  already been registered assigning a specific monitor serial # to you.   Billing and Patient Assistance Program information  Meredeth has been supplied with any insurance information on record for billing. [employer] offers a sliding scale Patient Assistance Program for patients without insurance, or whose  insurance does not completely cover the cost of the ZIO patch monitor. You must apply for the  Patient Assistance Program to qualify for the discounted rate. To apply, call [employer] at [phone],  select option 4, select option 2 , ask to apply for the Patient Assistance Program, (you can request an  interpreter if needed). [employer] will ask your household income and how many people are in your  household. [employer] will quote your out-of-pocket cost based on this information. They will also be able  to set up a 12 month interest free payment plan if needed.  Applying the monitor   Shave hair from upper left chest.  Hold the abrader disc by orange tab. Rub the abrader in 40 strokes over left upper chest as indicated in  your monitor instructions.  Clean area with 4 enclosed alcohol pads. Use all pads to ensure the area is cleaned thoroughly. Let  dry.  Apply patch as indicated in monitor instructions. Patch will be placed under collarbone on left side of  chest with arrow pointing upward.  Rub patch adhesive wings for 2  minutes. Remove the white label marked 1. Remove the white label  marked 2. Rub patch adhesive wings for 2 additional minutes.  While looking in a mirror, press and release button in center of patch. A small green light will flash 3-4  times. This will be your only indicator that the monitor has been turned on.  Do not shower  for the first 24 hours. You may shower after the first 24 hours.  Press the button if you feel a symptom. You will hear a small click. Record Date, Time and Symptom in  the Patient Log.   Starting the Gateway  In your kit there is a Audiological scientist box the size of a cellphone. This is Buyer, retail. It transmits all your  recorded data to [facility]. This box must always stay within 10 feet of you. Open the box and push the *  button. There will be a light that blinks orange and then green a few times. When the light stops  blinking, the Gateway is connected to the ZIO patch. Call [employer] at [phone] to confirm your monitor is transmitting.  Returning your monitor  Remove your patch and place it inside the Gateway. In the lower half of the Gateway there is a white  bag with prepaid postage on it. Place Gateway in bag and seal. Mail package back to [place] as soon as  possible. Your physician should have your final report approximately 7 days after you have mailed back  your monitor. Call [facility] Customer Care at [phone] if you have questions regarding your ZIO AT  patch monitor. Call them immediately if you see an orange light blinking on your monitor.  If your monitor falls off in less than 4 days, contact our Monitor department at [phone]. If your  monitor becomes loose or falls off after 4 days call [employer] at [phone] for suggestions on  securing your monitor   Follow-Up: At [facility], you and your health needs are our priority.  As part of our continuing mission to provide you with exceptional heart care, our providers are all part of one team.  This team includes your primary Cardiologist (physician) and Advanced Practice Providers or APPs (Physician Assistants and Nurse Practitioners) who all work together to provide you with the care you need, when you need it.  Your next appointment:   6 week(s)  Provider:   Dwana ,MD only     We  recommend signing up for the patient portal called MyChart.  Sign up information is provided on this After Visit Summary.  MyChart is used to connect with patients for Virtual Visits (Telemedicine).  Patients are able to view lab/test results, encounter notes, upcoming appointments, etc.  Non-urgent messages can be sent to your provider as well.   To learn more about what you can do with MyChart, go to [email or web address].   Other Instructions No driving for 6 months.

## 2024-09-24 ENCOUNTER — Ambulatory Visit: Attending: Physician Assistant

## 2024-09-24 DIAGNOSIS — R55 Syncope and collapse: Secondary | ICD-10-CM

## 2024-09-25 LAB — ECHOCARDIOGRAM COMPLETE
AR max vel: 2.92 cm2
AV Area VTI: 2.72 cm2
AV Area mean vel: 2.55 cm2
AV Mean grad: 2.3 mmHg
AV Peak grad: 4.6 mmHg
Ao pk vel: 1.07 m/s
Area-P 1/2: 3.76 cm2
Calc EF: 71.1 %
S' Lateral: 2.7 cm
Single Plane A2C EF: 66.6 %
Single Plane A4C EF: 74.9 %

## 2024-09-26 DIAGNOSIS — S82891A Other fracture of right lower leg, initial encounter for closed fracture: Secondary | ICD-10-CM | POA: Diagnosis not present

## 2024-09-27 ENCOUNTER — Ambulatory Visit: Payer: Self-pay | Admitting: Physician Assistant

## 2024-09-27 DIAGNOSIS — R55 Syncope and collapse: Secondary | ICD-10-CM | POA: Diagnosis not present

## 2024-10-11 ENCOUNTER — Other Ambulatory Visit (HOSPITAL_COMMUNITY)

## 2024-10-14 ENCOUNTER — Ambulatory Visit: Attending: Cardiovascular Disease | Admitting: Cardiovascular Disease

## 2024-10-14 ENCOUNTER — Encounter: Payer: Self-pay | Admitting: Cardiovascular Disease

## 2024-10-14 VITALS — BP 150/64 | HR 57 | Ht 64.0 in | Wt 166.0 lb

## 2024-10-14 DIAGNOSIS — R55 Syncope and collapse: Secondary | ICD-10-CM | POA: Diagnosis not present

## 2024-10-14 DIAGNOSIS — I10 Essential (primary) hypertension: Secondary | ICD-10-CM

## 2024-10-14 DIAGNOSIS — I25118 Atherosclerotic heart disease of native coronary artery with other forms of angina pectoris: Secondary | ICD-10-CM | POA: Diagnosis not present

## 2024-10-14 DIAGNOSIS — E785 Hyperlipidemia, unspecified: Secondary | ICD-10-CM | POA: Insufficient documentation

## 2024-10-14 DIAGNOSIS — E782 Mixed hyperlipidemia: Secondary | ICD-10-CM

## 2024-10-14 DIAGNOSIS — Z951 Presence of aortocoronary bypass graft: Secondary | ICD-10-CM | POA: Diagnosis not present

## 2024-10-14 NOTE — Patient Instructions (Signed)
 Medication Instructions:  Your physician recommends that you continue on your current medications as directed. Please refer to the Current Medication list given to you today.  *If you need a refill on your cardiac medications before your next appointment, please call your pharmacy*   Follow-Up: At [facility], you and your health needs are our priority.  As part of our continuing mission to provide you with exceptional heart care, our providers are all part of one team.  This team includes your primary Cardiologist (physician) and Advanced Practice Providers or APPs (Physician Assistants and Nurse Practitioners) who all work together to provide you with the care you need, when you need it.  Your next appointment:   6 month(s)  Provider:   Jon Hails, PA-C         Then, Dorn Lesches, MD will plan to see you again in 12 month(s).

## 2024-10-14 NOTE — Assessment & Plan Note (Signed)
 History of CAD status post Cardi catheterization performed by Dr. Dann 06/14/2019 revealing three-vessel disease.  She subsequently had CABG x 3 by Dr. Army with a LIMA to LAD, vein to an OM and RCA.  She denies chest pain or shortness of breath.

## 2024-10-14 NOTE — Assessment & Plan Note (Signed)
 History of essential hypertension with blood pressure measured today at 150/64.  She is on chlorthalidone , lisinopril  and metoprolol .

## 2024-10-14 NOTE — Assessment & Plan Note (Signed)
 Patient had a syncopal episode 08/17/2024.  She was apparently at the market, had not eaten or hydrated prior to shopping and fell out while she was sitting in her car and apparently fractured her right ankle.  A subsequent 2D echo performed 09/24/2024 was entirely normal as was a Zio patch that showed no arrhythmias.  She has had no further episodes.

## 2024-10-14 NOTE — Progress Notes (Signed)
 10/14/2024 Kathleen Terrell   01-21-1946  [id number]  Primary Physician Dow Longs, PA-C Primary Cardiologist: Dorn JINNY Lesches MD [facility] [place], [place]  HPI:  Kathleen Terrell is a 78 y.o. mildly overweight widowed African-American female mother of 2 children, grandmother of 3 grandchildren who is accompanied by her oldest daughter Lonell and younger daughter Annabella today.  She was previously a patient of Dr. Dann and Nahser.  Her risk factors include treated hypertension and hyperlipidemia.  She did have cardiac catheterization performed by Dr. Dann 06/14/2019 revealing three-vessel disease.  She subsequently had CABG x 3 by Dr. Army with LIMA to LAD, vein to OM and RCA.  She has been stable since that time.  She had a syncopal episode 08/17/2024 and lost consciousness after shopping having not eaten or hydrated.  She passed out while sitting in her car, fell and fractured her right ankle.  She is currently wearing an immobilizing cast.   Current Meds  Medication Sig   acetaminophen  (TYLENOL ) 500 MG tablet Take 500 mg by mouth every 6 (six) hours as needed (PAIN.).   aspirin  EC 81 MG tablet Take 81 mg by mouth in the morning. Swallow whole.   atorvastatin  (LIPITOR ) 40 MG tablet TAKE 1 TABLET BY MOUTH DAILY   Calcium  Carb-Cholecalciferol (CALCIUM  + D3 PO) Take 1 tablet by mouth in the morning and at bedtime.   chlorthalidone  (HYGROTON ) 25 MG tablet TAKE 1 TABLET BY MOUTH DAILY   Cholecalciferol (VITAMIN D3 PO) Take 1 tablet by mouth in the morning.   levothyroxine  (SYNTHROID ) 75 MCG tablet Take 75 mcg by mouth daily before breakfast.   lisinopril  (ZESTRIL ) 20 MG tablet TAKE 1 TABLET BY MOUTH DAILY   metoprolol  tartrate (LOPRESSOR ) 25 MG tablet TAKE 1 TABLET BY MOUTH TWICE  DAILY   Misc Natural Products (TART CHERRY ADVANCED PO) Take 1 capsule by mouth daily.   NON FORMULARY Take 1,000 mg by mouth in the morning. BEET ROOT SUPPLEMENT: 1000 MG DAILY   Probiotic Product  (PROBIOTIC FORMULA PO) Take 1 capsule by mouth daily in the afternoon.   TURMERIC CURCUMIN PO Take 1 capsule by mouth in the morning.     Allergies  Allergen Reactions   Methionine Other (See Comments)    UNSPECIFIED REACTION  Pt not sure   Darvocet [Propoxyphene N-Acetaminophen ] Nausea Only and Other (See Comments)   Latex Rash   Ultram [Tramadol Hcl] Nausea And Vomiting    Social History   Socioeconomic History   Marital status: Widowed    Spouse name: Not on file   Number of children: Not on file   Years of education: Not on file   Highest education level: Not on file  Occupational History   Occupation: HAIR DRESSER- [employer]  Tobacco Use   Smoking status: Former    Current packs/day: 0.00    Average packs/day: 1 pack/day for 19.0 years (19.0 ttl pk-yrs)    Types: Cigarettes    Start date: 03/09/1965    Quit date: 03/09/1984    Years since quitting: 40.6   Smokeless tobacco: Never  Vaping Use   Vaping status: Never Used  Substance and Sexual Activity   Alcohol use: No   Drug use: No   Sexual activity: Not on file  Other Topics Concern   Not on file  Social History Narrative   Used to work for [employer] 78 yo   Social Drivers of Corporate investment banker Strain:  Not on file  Food Insecurity: Not on file  Transportation Needs: Not on file  Physical Activity: Not on file  Stress: Not on file  Social Connections: Not on file  Intimate Partner Violence: Not on file     Review of Systems: General: negative for chills, fever, night sweats or weight changes.  Cardiovascular: negative for chest pain, dyspnea on exertion, edema, orthopnea, palpitations, paroxysmal nocturnal dyspnea or shortness of breath Dermatological: negative for rash Respiratory: negative for cough or wheezing Urologic: negative for hematuria Abdominal: negative for nausea, vomiting, diarrhea, bright red blood per rectum, melena, or hematemesis Neurologic:  negative for visual changes, syncope, or dizziness All other systems reviewed and are otherwise negative except as noted above.    Blood pressure (!) 150/64, pulse (!) 57, height 5' 4 (1.626 m), weight 166 lb (75.3 kg), SpO2 98%.  General appearance: alert and no distress Neck: no adenopathy, no carotid bruit, no JVD, supple, symmetrical, trachea midline, and thyroid  not enlarged, symmetric, no tenderness/mass/nodules Lungs: clear to auscultation bilaterally Heart: regular rate and rhythm, S1, S2 normal, no murmur, click, rub or gallop Extremities: extremities normal, atraumatic, no cyanosis or edema Pulses: 2+ and symmetric Skin: Skin color, texture, turgor normal. No rashes or lesions Neurologic: Grossly normal  EKG EKG Interpretation Date/Time:  Monday October 14 2024 16:12:20 EDT Ventricular Rate:  57 PR Interval:  152 QRS Duration:  76 QT Interval:  420 QTC Calculation: 408 R Axis:   22  Text Interpretation: Sinus bradycardia T wave abnormality, consider anterior ischemia When compared with ECG of 20-Feb-2024 09:51, No significant change was found Confirmed by Court Carrier [phone]) on 10/14/2024 4:25:38 PM    ASSESSMENT AND PLAN:   S/P CABG (coronary artery bypass graft) History of CAD status post Cardi catheterization performed by Dr. Dann 06/14/2019 revealing three-vessel disease.  She subsequently had CABG x 3 by Dr. Army with a LIMA to LAD, vein to an OM and RCA.  She denies chest pain or shortness of breath.  Essential hypertension History of essential hypertension with blood pressure measured today at 150/64.  She is on chlorthalidone , lisinopril  and metoprolol .  Hyperlipidemia History of hyperlipidemia on statin therapy with lipid profile performed 05/27/2024 revealing total cholesterol 152, LDL of 64 and HDL of 56.  Syncope and collapse Patient had a syncopal episode 08/17/2024.  She was apparently at the market, had not eaten or hydrated prior to shopping  and fell out while she was sitting in her car and apparently fractured her right ankle.  A subsequent 2D echo performed 09/24/2024 was entirely normal as was a Zio patch that showed no arrhythmias.  She has had no further episodes.     Carrier DOROTHA Court MD FACP,FACC,FAHA, [facility] 10/14/2024 4:39 PM

## 2024-10-14 NOTE — Assessment & Plan Note (Signed)
 History of hyperlipidemia on statin therapy with lipid profile performed 05/27/2024 revealing total cholesterol 152, LDL of 64 and HDL of 56.

## 2024-10-28 ENCOUNTER — Encounter: Payer: Self-pay | Admitting: *Deleted

## 2024-10-28 NOTE — Progress Notes (Signed)
 Kathleen Terrell                                          MRN: [id number]   10/28/2024   The [facility] Quality Team Specialist reviewed this patient medical record for the purposes of chart review for care gap closure. The following were reviewed: chart review for care gap closure-kidney health evaluation for diabetes:eGFR  and uACR.    [facility] Quality Team

## 2024-11-19 ENCOUNTER — Other Ambulatory Visit: Payer: Self-pay | Admitting: Cardiovascular Disease

## 2024-11-26 ENCOUNTER — Other Ambulatory Visit: Payer: Self-pay | Admitting: Cardiovascular Disease

## 2024-11-28 MED ORDER — ATORVASTATIN CALCIUM 40 MG PO TABS: 40.0000 mg | ORAL_TABLET | Freq: Every day | ORAL | 2 refills | Status: AC

## 2024-11-28 MED ORDER — CHLORTHALIDONE 25 MG PO TABS: 25.0000 mg | ORAL_TABLET | Freq: Every day | ORAL | 2 refills | Status: AC

## 2024-12-05 ENCOUNTER — Other Ambulatory Visit: Payer: Self-pay | Admitting: Cardiovascular Disease

## 2024-12-06 MED ORDER — LISINOPRIL 20 MG PO TABS: 20.0000 mg | ORAL_TABLET | Freq: Every day | ORAL | 3 refills | Status: AC

## 2024-12-06 MED ORDER — METOPROLOL TARTRATE 25 MG PO TABS: 25.0000 mg | ORAL_TABLET | Freq: Two times a day (BID) | ORAL | 3 refills | Status: AC
